# Patient Record
Sex: Female | Born: 1960 | Race: Black or African American | Hispanic: No | Smoking: Current some day smoker
Health system: Southern US, Community
[De-identification: ages and names within clinical notes are randomized; demographics above are authoritative.]

## PROBLEM LIST (undated history)

## (undated) ENCOUNTER — Emergency Department (HOSPITAL_COMMUNITY): Admission: EM | Payer: Medicaid Other | Source: Home / Self Care

## (undated) DIAGNOSIS — R519 Headache, unspecified: Secondary | ICD-10-CM

## (undated) DIAGNOSIS — R413 Other amnesia: Secondary | ICD-10-CM

## (undated) DIAGNOSIS — F32A Depression, unspecified: Secondary | ICD-10-CM

## (undated) DIAGNOSIS — E785 Hyperlipidemia, unspecified: Secondary | ICD-10-CM

## (undated) DIAGNOSIS — F419 Anxiety disorder, unspecified: Secondary | ICD-10-CM

## (undated) DIAGNOSIS — R51 Headache: Secondary | ICD-10-CM

## (undated) DIAGNOSIS — M549 Dorsalgia, unspecified: Secondary | ICD-10-CM

## (undated) DIAGNOSIS — M1990 Unspecified osteoarthritis, unspecified site: Secondary | ICD-10-CM

## (undated) DIAGNOSIS — D509 Iron deficiency anemia, unspecified: Secondary | ICD-10-CM

## (undated) DIAGNOSIS — F329 Major depressive disorder, single episode, unspecified: Secondary | ICD-10-CM

## (undated) DIAGNOSIS — K5730 Diverticulosis of large intestine without perforation or abscess without bleeding: Secondary | ICD-10-CM

## (undated) DIAGNOSIS — M5430 Sciatica, unspecified side: Secondary | ICD-10-CM

## (undated) HISTORY — DX: Other amnesia: R41.3

## (undated) HISTORY — DX: Diverticulosis of large intestine without perforation or abscess without bleeding: K57.30

## (undated) HISTORY — DX: Hyperlipidemia, unspecified: E78.5

## (undated) HISTORY — DX: Iron deficiency anemia, unspecified: D50.9

## (undated) HISTORY — DX: Sciatica, unspecified side: M54.30

## (undated) HISTORY — PX: OTHER SURGICAL HISTORY: SHX169

## (undated) HISTORY — PX: TONSILLECTOMY: SUR1361

---

## 1998-04-26 ENCOUNTER — Encounter: Admission: RE | Admit: 1998-04-26 | Discharge: 1998-04-26 | Payer: Self-pay | Admitting: Family Medicine

## 1998-06-01 ENCOUNTER — Encounter: Admission: RE | Admit: 1998-06-01 | Discharge: 1998-06-01 | Payer: Self-pay | Admitting: Family Medicine

## 1998-06-17 ENCOUNTER — Encounter: Admission: RE | Admit: 1998-06-17 | Discharge: 1998-09-15 | Payer: Self-pay | Admitting: *Deleted

## 1998-08-06 ENCOUNTER — Encounter: Admission: RE | Admit: 1998-08-06 | Discharge: 1998-08-06 | Payer: Self-pay | Admitting: Family Medicine

## 1998-09-01 ENCOUNTER — Encounter: Admission: RE | Admit: 1998-09-01 | Discharge: 1998-09-01 | Payer: Self-pay | Admitting: Family Medicine

## 1999-02-28 ENCOUNTER — Encounter (HOSPITAL_COMMUNITY): Admission: RE | Admit: 1999-02-28 | Discharge: 1999-02-28 | Payer: Self-pay | Admitting: Psychiatry

## 1999-03-08 ENCOUNTER — Emergency Department (HOSPITAL_COMMUNITY): Admission: EM | Admit: 1999-03-08 | Discharge: 1999-03-08 | Payer: Self-pay | Admitting: Emergency Medicine

## 1999-03-09 ENCOUNTER — Encounter: Payer: Self-pay | Admitting: Emergency Medicine

## 1999-04-29 ENCOUNTER — Encounter: Admission: RE | Admit: 1999-04-29 | Discharge: 1999-04-29 | Payer: Self-pay | Admitting: Family Medicine

## 1999-05-23 ENCOUNTER — Encounter: Admission: RE | Admit: 1999-05-23 | Discharge: 1999-05-23 | Payer: Self-pay | Admitting: Family Medicine

## 1999-10-13 ENCOUNTER — Encounter: Admission: RE | Admit: 1999-10-13 | Discharge: 1999-10-13 | Payer: Self-pay | Admitting: Family Medicine

## 1999-10-20 ENCOUNTER — Encounter: Admission: RE | Admit: 1999-10-20 | Discharge: 1999-10-20 | Payer: Self-pay | Admitting: Family Medicine

## 1999-10-27 ENCOUNTER — Encounter: Admission: RE | Admit: 1999-10-27 | Discharge: 1999-10-27 | Payer: Self-pay | Admitting: Family Medicine

## 2000-02-10 ENCOUNTER — Emergency Department (HOSPITAL_COMMUNITY): Admission: EM | Admit: 2000-02-10 | Discharge: 2000-02-10 | Payer: Self-pay | Admitting: Emergency Medicine

## 2000-06-29 ENCOUNTER — Emergency Department (HOSPITAL_COMMUNITY): Admission: EM | Admit: 2000-06-29 | Discharge: 2000-06-29 | Payer: Self-pay | Admitting: Emergency Medicine

## 2000-10-12 ENCOUNTER — Emergency Department (HOSPITAL_COMMUNITY): Admission: EM | Admit: 2000-10-12 | Discharge: 2000-10-12 | Payer: Self-pay | Admitting: Emergency Medicine

## 2000-11-19 ENCOUNTER — Encounter: Admission: RE | Admit: 2000-11-19 | Discharge: 2000-11-19 | Payer: Self-pay | Admitting: Family Medicine

## 2000-12-19 ENCOUNTER — Encounter: Admission: RE | Admit: 2000-12-19 | Discharge: 2000-12-19 | Payer: Self-pay | Admitting: Family Medicine

## 2001-01-01 ENCOUNTER — Encounter: Admission: RE | Admit: 2001-01-01 | Discharge: 2001-01-01 | Payer: Self-pay | Admitting: Family Medicine

## 2001-01-01 ENCOUNTER — Encounter: Payer: Self-pay | Admitting: Family Medicine

## 2001-01-08 ENCOUNTER — Encounter: Admission: RE | Admit: 2001-01-08 | Discharge: 2001-01-08 | Payer: Self-pay | Admitting: Family Medicine

## 2001-07-10 ENCOUNTER — Emergency Department (HOSPITAL_COMMUNITY): Admission: EM | Admit: 2001-07-10 | Discharge: 2001-07-11 | Payer: Self-pay | Admitting: *Deleted

## 2001-08-15 ENCOUNTER — Encounter: Admission: RE | Admit: 2001-08-15 | Discharge: 2001-08-15 | Payer: Self-pay | Admitting: Family Medicine

## 2002-01-01 ENCOUNTER — Encounter: Admission: RE | Admit: 2002-01-01 | Discharge: 2002-01-01 | Payer: Self-pay | Admitting: Family Medicine

## 2002-06-02 ENCOUNTER — Encounter: Admission: RE | Admit: 2002-06-02 | Discharge: 2002-06-02 | Payer: Self-pay | Admitting: Family Medicine

## 2002-06-02 ENCOUNTER — Other Ambulatory Visit: Admission: RE | Admit: 2002-06-02 | Discharge: 2002-06-02 | Payer: Self-pay | Admitting: Family Medicine

## 2002-09-24 ENCOUNTER — Encounter: Admission: RE | Admit: 2002-09-24 | Discharge: 2002-09-24 | Payer: Self-pay | Admitting: Family Medicine

## 2002-10-03 ENCOUNTER — Encounter: Payer: Self-pay | Admitting: Family Medicine

## 2002-10-03 ENCOUNTER — Encounter: Admission: RE | Admit: 2002-10-03 | Discharge: 2002-10-03 | Payer: Self-pay | Admitting: Family Medicine

## 2003-07-14 ENCOUNTER — Encounter: Admission: RE | Admit: 2003-07-14 | Discharge: 2003-07-14 | Payer: Self-pay | Admitting: Family Medicine

## 2003-11-23 ENCOUNTER — Emergency Department (HOSPITAL_COMMUNITY): Admission: EM | Admit: 2003-11-23 | Discharge: 2003-11-23 | Payer: Self-pay | Admitting: Emergency Medicine

## 2004-02-09 ENCOUNTER — Encounter (INDEPENDENT_AMBULATORY_CARE_PROVIDER_SITE_OTHER): Payer: Self-pay | Admitting: *Deleted

## 2004-02-09 LAB — CONVERTED CEMR LAB

## 2004-02-11 ENCOUNTER — Encounter: Admission: RE | Admit: 2004-02-11 | Discharge: 2004-02-11 | Payer: Self-pay | Admitting: Family Medicine

## 2004-02-11 ENCOUNTER — Encounter (INDEPENDENT_AMBULATORY_CARE_PROVIDER_SITE_OTHER): Payer: Self-pay | Admitting: Specialist

## 2004-11-23 ENCOUNTER — Ambulatory Visit: Payer: Self-pay | Admitting: Family Medicine

## 2004-11-29 ENCOUNTER — Ambulatory Visit (HOSPITAL_COMMUNITY): Admission: RE | Admit: 2004-11-29 | Discharge: 2004-11-29 | Payer: Self-pay | Admitting: Sports Medicine

## 2004-12-16 ENCOUNTER — Ambulatory Visit: Payer: Self-pay | Admitting: Family Medicine

## 2004-12-19 ENCOUNTER — Ambulatory Visit: Payer: Self-pay | Admitting: Family Medicine

## 2005-02-17 ENCOUNTER — Ambulatory Visit: Payer: Self-pay | Admitting: Family Medicine

## 2005-02-24 ENCOUNTER — Ambulatory Visit: Payer: Self-pay | Admitting: Family Medicine

## 2005-11-27 ENCOUNTER — Emergency Department (HOSPITAL_COMMUNITY): Admission: EM | Admit: 2005-11-27 | Discharge: 2005-11-28 | Payer: Self-pay | Admitting: Emergency Medicine

## 2006-02-15 ENCOUNTER — Ambulatory Visit: Payer: Self-pay | Admitting: Family Medicine

## 2006-02-22 ENCOUNTER — Ambulatory Visit: Payer: Self-pay | Admitting: Family Medicine

## 2006-02-28 ENCOUNTER — Ambulatory Visit: Payer: Self-pay | Admitting: Sports Medicine

## 2006-03-05 ENCOUNTER — Ambulatory Visit (HOSPITAL_COMMUNITY): Admission: RE | Admit: 2006-03-05 | Discharge: 2006-03-05 | Payer: Self-pay | Admitting: Family Medicine

## 2006-04-11 ENCOUNTER — Ambulatory Visit: Payer: Self-pay | Admitting: Family Medicine

## 2007-02-07 DIAGNOSIS — R159 Full incontinence of feces: Secondary | ICD-10-CM

## 2007-02-07 DIAGNOSIS — E7800 Pure hypercholesterolemia, unspecified: Secondary | ICD-10-CM

## 2007-02-07 DIAGNOSIS — F410 Panic disorder [episodic paroxysmal anxiety] without agoraphobia: Secondary | ICD-10-CM | POA: Insufficient documentation

## 2007-02-07 DIAGNOSIS — M545 Low back pain: Secondary | ICD-10-CM

## 2007-02-08 ENCOUNTER — Encounter (INDEPENDENT_AMBULATORY_CARE_PROVIDER_SITE_OTHER): Payer: Self-pay | Admitting: *Deleted

## 2007-08-01 ENCOUNTER — Encounter (INDEPENDENT_AMBULATORY_CARE_PROVIDER_SITE_OTHER): Payer: Self-pay | Admitting: *Deleted

## 2008-03-04 LAB — CONVERTED CEMR LAB: LDL Cholesterol: 140 mg/dL

## 2008-03-05 ENCOUNTER — Telehealth: Payer: Self-pay | Admitting: *Deleted

## 2008-03-06 ENCOUNTER — Encounter (INDEPENDENT_AMBULATORY_CARE_PROVIDER_SITE_OTHER): Payer: Self-pay | Admitting: *Deleted

## 2008-03-06 ENCOUNTER — Ambulatory Visit: Payer: Self-pay | Admitting: Family Medicine

## 2008-03-06 DIAGNOSIS — N920 Excessive and frequent menstruation with regular cycle: Secondary | ICD-10-CM

## 2008-03-06 LAB — CONVERTED CEMR LAB: Beta hcg, urine, semiquantitative: NEGATIVE

## 2008-03-10 LAB — CONVERTED CEMR LAB
ALT: 12 units/L (ref 0–35)
AST: 17 units/L (ref 0–37)
Albumin: 4.3 g/dL (ref 3.5–5.2)
Alkaline Phosphatase: 61 units/L (ref 39–117)
BUN: 12 mg/dL (ref 6–23)
CO2: 24 meq/L (ref 19–32)
Calcium: 9.1 mg/dL (ref 8.4–10.5)
Chlamydia, DNA Probe: NEGATIVE
Chloride: 106 meq/L (ref 96–112)
Creatinine, Ser: 0.73 mg/dL (ref 0.40–1.20)
Direct LDL: 140 mg/dL — ABNORMAL HIGH
GC Probe Amp, Genital: NEGATIVE
Glucose, Bld: 89 mg/dL (ref 70–99)
HCT: 35.8 % — ABNORMAL LOW (ref 36.0–46.0)
HCV Ab: NEGATIVE
Hemoglobin: 11.5 g/dL — ABNORMAL LOW (ref 12.0–15.0)
Hepatitis B Surface Ag: NEGATIVE
INR: 1 (ref 0.0–1.5)
MCHC: 32.1 g/dL (ref 30.0–36.0)
MCV: 88.6 fL (ref 78.0–100.0)
Pap Smear: NORMAL
Platelets: 384 10*3/uL (ref 150–400)
Potassium: 4.3 meq/L (ref 3.5–5.3)
Prothrombin Time: 13.3 s (ref 11.6–15.2)
RBC: 4.04 M/uL (ref 3.87–5.11)
RDW: 14.6 % (ref 11.5–15.5)
Sodium: 140 meq/L (ref 135–145)
TSH: 0.7 microintl units/mL (ref 0.350–5.50)
Total Bilirubin: 0.5 mg/dL (ref 0.3–1.2)
Total Protein: 7.3 g/dL (ref 6.0–8.3)
WBC: 6 10*3/uL (ref 4.0–10.5)
aPTT: 31 s

## 2008-03-16 ENCOUNTER — Ambulatory Visit: Payer: Self-pay | Admitting: Family Medicine

## 2008-03-16 ENCOUNTER — Encounter (INDEPENDENT_AMBULATORY_CARE_PROVIDER_SITE_OTHER): Payer: Self-pay | Admitting: *Deleted

## 2008-03-17 ENCOUNTER — Telehealth (INDEPENDENT_AMBULATORY_CARE_PROVIDER_SITE_OTHER): Payer: Self-pay | Admitting: Family Medicine

## 2008-03-18 ENCOUNTER — Encounter (INDEPENDENT_AMBULATORY_CARE_PROVIDER_SITE_OTHER): Payer: Self-pay | Admitting: *Deleted

## 2008-04-06 ENCOUNTER — Ambulatory Visit: Payer: Self-pay | Admitting: Family Medicine

## 2008-04-07 ENCOUNTER — Telehealth: Payer: Self-pay | Admitting: *Deleted

## 2008-04-17 ENCOUNTER — Ambulatory Visit (HOSPITAL_COMMUNITY): Admission: RE | Admit: 2008-04-17 | Discharge: 2008-04-17 | Payer: Self-pay | Admitting: *Deleted

## 2008-04-28 ENCOUNTER — Ambulatory Visit (HOSPITAL_COMMUNITY): Admission: RE | Admit: 2008-04-28 | Discharge: 2008-04-28 | Payer: Self-pay | Admitting: Family Medicine

## 2008-04-29 DIAGNOSIS — D259 Leiomyoma of uterus, unspecified: Secondary | ICD-10-CM | POA: Insufficient documentation

## 2008-04-30 ENCOUNTER — Telehealth (INDEPENDENT_AMBULATORY_CARE_PROVIDER_SITE_OTHER): Payer: Self-pay | Admitting: *Deleted

## 2008-07-03 ENCOUNTER — Telehealth: Payer: Self-pay | Admitting: *Deleted

## 2008-07-08 ENCOUNTER — Telehealth: Payer: Self-pay | Admitting: *Deleted

## 2008-10-28 ENCOUNTER — Telehealth: Payer: Self-pay | Admitting: *Deleted

## 2008-12-08 ENCOUNTER — Telehealth: Payer: Self-pay | Admitting: *Deleted

## 2008-12-14 ENCOUNTER — Telehealth: Payer: Self-pay | Admitting: Family Medicine

## 2009-02-01 ENCOUNTER — Ambulatory Visit: Payer: Self-pay | Admitting: Family Medicine

## 2009-02-01 ENCOUNTER — Encounter (INDEPENDENT_AMBULATORY_CARE_PROVIDER_SITE_OTHER): Payer: Self-pay | Admitting: Family Medicine

## 2009-02-01 DIAGNOSIS — B351 Tinea unguium: Secondary | ICD-10-CM | POA: Insufficient documentation

## 2009-02-02 ENCOUNTER — Encounter (INDEPENDENT_AMBULATORY_CARE_PROVIDER_SITE_OTHER): Payer: Self-pay | Admitting: Family Medicine

## 2009-02-02 LAB — CONVERTED CEMR LAB
ALT: 11 units/L (ref 0–35)
AST: 19 units/L (ref 0–37)
Albumin: 4.1 g/dL (ref 3.5–5.2)
Alkaline Phosphatase: 61 units/L (ref 39–117)
BUN: 10 mg/dL (ref 6–23)
CO2: 22 meq/L (ref 19–32)
Calcium: 9.4 mg/dL (ref 8.4–10.5)
Chloride: 107 meq/L (ref 96–112)
Cholesterol: 148 mg/dL (ref 0–200)
Creatinine, Ser: 0.77 mg/dL (ref 0.40–1.20)
Glucose, Bld: 109 mg/dL — ABNORMAL HIGH (ref 70–99)
HDL: 48 mg/dL (ref >39)
LDL Cholesterol: 80 mg/dL (ref 0–99)
Potassium: 4.2 meq/L (ref 3.5–5.3)
Sodium: 142 meq/L (ref 135–145)
Total Bilirubin: 0.2 mg/dL — ABNORMAL LOW (ref 0.3–1.2)
Total CHOL/HDL Ratio: 3.1
Total Protein: 7.1 g/dL (ref 6.0–8.3)
Triglycerides: 99 mg/dL (ref <150)
VLDL: 20 mg/dL (ref 0–40)

## 2009-06-18 ENCOUNTER — Telehealth: Payer: Self-pay | Admitting: *Deleted

## 2009-10-29 ENCOUNTER — Encounter: Payer: Self-pay | Admitting: Family Medicine

## 2009-10-29 ENCOUNTER — Ambulatory Visit: Payer: Self-pay | Admitting: Family Medicine

## 2009-10-29 LAB — CONVERTED CEMR LAB
HCT: 37.3 % (ref 36.0–46.0)
Hemoglobin: 11.5 g/dL — ABNORMAL LOW (ref 12.0–15.0)
MCHC: 30.8 g/dL (ref 30.0–36.0)
MCV: 86.7 fL (ref 78.0–100.0)
Platelets: 334 10*3/uL (ref 150–400)
RBC: 4.3 M/uL (ref 3.87–5.11)
RDW: 15.8 % — ABNORMAL HIGH (ref 11.5–15.5)
WBC: 6.6 10*3/uL (ref 4.0–10.5)

## 2009-11-01 ENCOUNTER — Encounter: Payer: Self-pay | Admitting: Family Medicine

## 2009-12-29 ENCOUNTER — Ambulatory Visit: Payer: Self-pay | Admitting: Family Medicine

## 2009-12-29 DIAGNOSIS — R358 Other polyuria: Secondary | ICD-10-CM

## 2009-12-29 LAB — CONVERTED CEMR LAB
Bilirubin Urine: NEGATIVE
Glucose, Urine, Semiquant: NEGATIVE
Ketones, urine, test strip: NEGATIVE
Nitrite: NEGATIVE
Protein, U semiquant: NEGATIVE
Specific Gravity, Urine: 1.02
Urobilinogen, UA: 0.2
WBC Urine, dipstick: NEGATIVE
pH: 7

## 2010-01-13 ENCOUNTER — Telehealth: Payer: Self-pay | Admitting: Family Medicine

## 2010-05-10 ENCOUNTER — Emergency Department (HOSPITAL_COMMUNITY): Admission: EM | Admit: 2010-05-10 | Discharge: 2010-05-10 | Payer: Self-pay | Admitting: Emergency Medicine

## 2010-08-16 ENCOUNTER — Emergency Department (HOSPITAL_COMMUNITY): Admission: EM | Admit: 2010-08-16 | Discharge: 2010-08-16 | Payer: Self-pay | Admitting: Emergency Medicine

## 2010-10-14 ENCOUNTER — Encounter: Payer: Self-pay | Admitting: Family Medicine

## 2010-12-16 ENCOUNTER — Telehealth: Payer: Self-pay | Admitting: *Deleted

## 2011-01-12 NOTE — Miscellaneous (Signed)
Summary: Clean up Problem List    

## 2011-01-12 NOTE — Progress Notes (Addendum)
Summary: Rx  Phone Note Refill Request Call back at Home Phone [phone]   Refills Requested: Medication #1:  PAROXETINE HCL 40 MG TABS Take 1/2 tablet by mouth once a day  Medication #2:  LOVASTATIN 40 MG  TABS Take one tablet at bedtime each day. pt just moved to [place], wants to know if we can send in a 3 month supply until she can find a family doctor there  Initial call taken by: Knox Royalty,  December 16, 2010 4:50 PM  Follow-up for Phone Call        where does she need the medication refills sent to?  Follow-up by: Bobby Rumpf  MD,  December 19, 2010 3:19 PM  Additional Follow-up for Phone Call Additional follow up Details #1::        left message for pt to either have the new pharmacy fax [place] the requests or call with new pharmacy info Additional Follow-up by: Golden Circle RN,  December 20, 2010 11:20 AM     Appended Document: Rx new pharmacy [phone] [place], [place] [address] [phone]  Appended Document: Rx done

## 2011-01-12 NOTE — Progress Notes (Signed)
Summary: Lab Res & Ref Ques  Phone Note Call from Patient Call back at [facility] Phone [phone]   Caller: Patient Summary of Call: Pt checking on lab work from last week.  Also Dr. Wallene Huh was going to check and see if Medicaid would cover her having a breast reduction. Initial call taken by: Clydell Hakim,  January 13, 2010 3:29 PM  Follow-up for Phone Call        will forward  to MD. Follow-up by: Theresia Lo RN,  January 13, 2010 4:42 PM  Additional Follow-up for Phone Call Additional follow up Details #1::        attempted to call patient. no answer. will try again  Additional Follow-up by: Bobby Rumpf  MD,  January 18, 2010 12:54 PM    Additional Follow-up for Phone Call Additional follow up Details #2::    pt returned call and needs to talk to him ASAP Follow-up by: De Nurse,  January 24, 2010 2:05 PM  Additional Follow-up for Phone Call Additional follow up Details #3:: Details for Additional Follow-up Action Taken: pt calling back again about results.  Additional Follow-up by: Clydell Hakim,  January 25, 2010 11:09 AM  I discussed the results of her UA & cbg. she had eaten when it was drawn. apologized that she had not rec'd a letter from [place] with the results. told her this is something that we usually do. perhaps it was lost in mail. she asked about breast reduction since her back hurts from them. states she is a 38C & she wants them lifted. told her medicaid does not pay for lifts. if excess breast tissue, they reduce the volume.  message to pcp that the process for getting a reduction approved is long & much documentation is needed. at her current size, it is doubtful that she qualifies. go to [facility] website & look for criteria for breast reduction. I have only seen them approve 1 in the almost 5 years I have been here. the process took about 1 yr.Marland KitchenMarland KitchenGolden Circle RN  January 25, 2010 4:26 PM

## 2011-01-12 NOTE — Assessment & Plan Note (Signed)
Summary: cpe,df   Vital Signs:  Patient profile:   50 year old female Height:      63.25 inches Weight:      143.6 pounds BMI:     25.33 Temp:     99.1 degrees F oral Pulse rate:   91 / minute BP sitting:   103 / 65  (left arm) Cuff size:   regular  Vitals Entered By: Garen Grams LPN (December 29, 2009 2:14 PM) CC: CPE Is Patient Diabetic? No Pain Assessment Patient in pain? no        Primary Care Provider:  Bobby Rumpf  MD  CC:  CPE.  History of Present Illness: 1) Onychomycosis: Unchanged. Had not started prescribed lamisil until two months ago, still continuing 12 week course.   2) Increased urination. Has noted increased urination x 1 month. Denies incontinence, increased thirst, medication change, head trauma, dysuria, fever, back pain. Urinating every hour, waking at night.   3) Breast reduction Would like breast reduction as she is experiencing back pain. Wants to know if medicaid would cover this. Last mammogram 1 year ago normal.  4) Prevention: Pap q3rs w/ normal Paps in '03, 06, '09, due in 2012. Received flu vaccine this season. Due for mammogram.   Habits & Providers  Alcohol-Tobacco-Diet     Tobacco Status: never  Allergies: No Known Drug Allergies  Physical Exam  General:  Well-developed,well-nourished,in no acute distress; alert,appropriate and cooperative throughout examination Eyes:  No corneal or conjunctival inflammation noted. EOMI. Perrla. Funduscopic exam benign, without hemorrhages, exudates or papilledema. Vision grossly normal. Ears:  External ear exam shows no significant lesions or deformities.  Otoscopic examination reveals clear canals, tympanic membranes are intact bilaterally without bulging, retraction, inflammation or discharge. Hearing is grossly normal bilaterally. Nose:  External nasal examination shows no deformity or inflammation. Nasal mucosa are pink and moist without lesions or exudates. Mouth:  Oral mucosa and oropharynx  without lesions or exudates.  Teeth in good repair. Neck:  No deformities, masses, or tenderness noted. Chest Wall:  No deformities, masses, or tenderness noted. Lungs:  Normal respiratory effort, chest expands symmetrically. Lungs are clear to auscultation, no crackles or wheezes. Heart:  Normal rate and regular rhythm. S1 and S2 normal without gallop, murmur, click, rub or other extra sounds. Abdomen:  Bowel sounds positive,abdomen soft and non-tender without masses, organomegaly or hernias noted. Msk:  No deformity or scoliosis noted of thoracic or lumbar spine.   Pulses:  R and L carotid,radial,femoral,dorsalis pedis and posterior tibial pulses are full and equal bilaterally Extremities:  toes of right foot with onychomycosis  Neurologic:  No cranial nerve deficits noted. Station and gait are normal. Plantar reflexes are down-going bilaterally. DTRs are symmetrical throughout. Sensory, motor and coordinative functions appear intact. Skin:  Intact without suspicious lesions or rashes Cervical Nodes:  No lymphadenopathy noted Psych:  Cognition and judgment appear intact. Alert and cooperative with normal attention span and concentration. No apparent delusions, illusions, hallucinations   Impression & Recommendations:  Problem # 1:  ONYCHOMYCOSIS, TOENAILS (ICD-110.1) Assessment Unchanged Continue Lamisil. Will follow.  Her updated medication list for this problem includes:    Terbinafine Hcl 250 Mg Tabs (Terbinafine hcl) .Marland Kitchen... 1 tablet by mouth daily x 12 weeks  Problem # 2:  POLYURIA (JYN-829.56) Assessment: New Will check UA, glucose concern for DM2. Will follow.  Orders: Urinalysis-[facility] (00000) Glucose Cap-[facility] (21308)  Problem # 3:  Preventive Health Care (ICD-V70.0) Assessment: Comment Only  Advised to call regarding obtaining  mamogram. Other prevention up to date.   Orders: [facility] - Est  40-64 yrs (16109)  Problem # 4:  HYPERCHOLESTEROLEMIA (ICD-272.0) Assessment:  Improved at goal. Continue statin.  Her updated medication list for this problem includes:    Lovastatin 40 Mg Tabs (Lovastatin) .Marland Kitchen... Take one tablet at bedtime each day.  Labs Reviewed: SGOT: 19 (02/01/2009)   SGPT: 11 (02/01/2009)   HDL:48 (02/01/2009)  LDL:80 (02/01/2009), 140 (60/45/4098)  Chol:148 (02/01/2009)  Trig:99 (02/01/2009)  Complete Medication List: 1)  Flexeril 5 Mg Tabs (Cyclobenzaprine hcl) .Marland Kitchen.. 1 tablet by mouth three times a day 2)  Paroxetine Hcl 40 Mg Tabs (Paroxetine hcl) .... Take 1/2 tablet by mouth once a day 3)  Caltrate 600+d 600-400 Mg-unit Tabs (Calcium carbonate-vitamin d) .... Take 1 tablet by mouth two times a day 4)  Ibuprofen 600 Mg Tabs (Ibuprofen) .Marland Kitchen.. 1 by mouth every 6 hours 5)  Aspirin 81 Mg Tbec (Aspirin) .... One by mouth every day 6)  Lovastatin 40 Mg Tabs (Lovastatin) .... Take one tablet at bedtime each day. 7)  Terbinafine Hcl 250 Mg Tabs (Terbinafine hcl) .Marland Kitchen.. 1 tablet by mouth daily x 12 weeks  Patient Instructions: 1)  It was great to see you today!  2)  Walk fast so that your sweat at least 5 times a week for 30 minutes.  3)  Schedule a mammogram.  4)  Follow up in 2 weeks if still having urinary trouble. I will call you with the results from today.   Laboratory Results   Urine Tests  Date/Time Received: December 29, 2009 2:56 PM  Date/Time Reported: December 29, 2009 3:16 PM   Routine Urinalysis   Color: yellow Appearance: Clear Glucose: negative   (Normal Range: Negative) Bilirubin: negative   (Normal Range: Negative) Ketone: negative   (Normal Range: Negative) Spec. Gravity: 1.020   (Normal Range: 1.003-1.035) Blood: trace-intact   (Normal Range: Negative) pH: 7.0   (Normal Range: 5.0-8.0) Protein: negative   (Normal Range: Negative) Urobilinogen: 0.2   (Normal Range: 0-1) Nitrite: negative   (Normal Range: Negative) Leukocyte Esterace: negative   (Normal Range: Negative)  Urine Microscopic RBC/HPF: 1-5 Bacteria/HPF:  1+ Epithelial/HPF: 1-5 Other: 2+ amorphous    Comments: ...............test performed by......Marland KitchenBonnie A. [place], MLS (ASCP)cm       Prevention & Chronic Care Immunizations   Influenza vaccine: Fluvax Non-MCR  (10/29/2009)    Tetanus booster: 05/11/1998: Done.   Tetanus booster due: 05/11/2008    Pneumococcal vaccine: Not documented  Other Screening   Pap smear: Normal  (03/10/2008)   Pap smear due: 03/11/2011    Mammogram: Normal  (04/20/2008)   Mammogram action/deferral: Ordered  (12/29/2009)   Mammogram due: 04/20/2009   Smoking status: never  (12/29/2009)  Lipids   Total Cholesterol: 148  (02/01/2009)   LDL: 80  (02/01/2009)   LDL Direct: 140  (03/06/2008)   HDL: 48  (02/01/2009)   Triglycerides: 99  (02/01/2009)    SGOT (AST): 19  (02/01/2009)   SGPT (ALT): 11  (02/01/2009)   Alkaline phosphatase: 61  (02/01/2009)   Total bilirubin: 0.2  (02/01/2009)    Lipid flowsheet reviewed?: Yes   Progress toward LDL goal: At goal  Self-Management Support :   Personal Goals (by the next clinic visit) :      Personal LDL goal: 100  (12/29/2009)    Lipid self-management support: Resources for patients handout  (12/29/2009)     Lipid self-management support not done because: Good outcomes  (12/29/2009)  Resource handout printed.

## 2011-02-26 LAB — GLUCOSE, CAPILLARY: Glucose-Capillary: 126 mg/dL — ABNORMAL HIGH (ref 70–99)

## 2011-05-02 ENCOUNTER — Other Ambulatory Visit: Payer: Self-pay | Admitting: Family Medicine

## 2011-05-02 MED ORDER — PAROXETINE HCL 40 MG PO TABS: 20.0000 mg | ORAL_TABLET | ORAL | Status: DC

## 2011-05-02 MED ORDER — LOVASTATIN 40 MG PO TABS: 40.0000 mg | ORAL_TABLET | Freq: Every day | ORAL | Status: DC

## 2011-05-02 NOTE — Telephone Encounter (Signed)
Pt states that she is here until Saturday.  States that she is planning on getting a MD in [place], but she hasn't quite figured her way around the city.  Pt says she has been on this medicine for 18 years and does not want to miss any doses.  Advised I would send a message to the MD and we would give her a call back with his decision.  Pt uses [facility] on [address] Brynna Dobos, [place]

## 2011-05-02 NOTE — Telephone Encounter (Signed)
Pt just moved to [place] but has not found a family doctor yet, is currently in [place] & wants to know if MD would be willing to give 2 month rx for her paxil and mevacor?

## 2011-05-02 NOTE — Telephone Encounter (Signed)
-----   Message from [facility] sent at 05/02/2011 2:25 PM -----  Please let her know she can pick up her medications at the pharmacy. (then can close encounter) Thanks! KC    Pt informed and very grateful [place], [place]

## 2011-05-03 ENCOUNTER — Other Ambulatory Visit: Payer: Self-pay | Admitting: Family Medicine

## 2011-05-03 MED ORDER — PAROXETINE HCL 40 MG PO TABS: 20.0000 mg | ORAL_TABLET | ORAL | Status: DC

## 2016-05-09 ENCOUNTER — Emergency Department (HOSPITAL_COMMUNITY)
Admission: EM | Admit: 2016-05-09 | Discharge: 2016-05-09 | Disposition: A | Discharge status: Home / Self Care | Payer: Medicaid Other | Attending: Emergency Medicine | Admitting: Emergency Medicine

## 2016-05-09 ENCOUNTER — Emergency Department (HOSPITAL_COMMUNITY): Payer: Medicaid Other

## 2016-05-09 ENCOUNTER — Encounter (HOSPITAL_COMMUNITY): Payer: Self-pay | Admitting: Emergency Medicine

## 2016-05-09 DIAGNOSIS — Z79899 Other long term (current) drug therapy: Secondary | ICD-10-CM | POA: Insufficient documentation

## 2016-05-09 DIAGNOSIS — Y9289 Other specified places as the place of occurrence of the external cause: Secondary | ICD-10-CM | POA: Insufficient documentation

## 2016-05-09 DIAGNOSIS — S3992XA Unspecified injury of lower back, initial encounter: Secondary | ICD-10-CM | POA: Diagnosis not present

## 2016-05-09 DIAGNOSIS — Z7982 Long term (current) use of aspirin: Secondary | ICD-10-CM | POA: Diagnosis not present

## 2016-05-09 DIAGNOSIS — W108XXA Fall (on) (from) other stairs and steps, initial encounter: Secondary | ICD-10-CM | POA: Insufficient documentation

## 2016-05-09 DIAGNOSIS — S92515A Nondisplaced fracture of proximal phalanx of left lesser toe(s), initial encounter for closed fracture: Secondary | ICD-10-CM | POA: Diagnosis not present

## 2016-05-09 DIAGNOSIS — G8929 Other chronic pain: Secondary | ICD-10-CM | POA: Insufficient documentation

## 2016-05-09 DIAGNOSIS — Y998 Other external cause status: Secondary | ICD-10-CM | POA: Diagnosis not present

## 2016-05-09 DIAGNOSIS — Y9389 Activity, other specified: Secondary | ICD-10-CM | POA: Diagnosis not present

## 2016-05-09 DIAGNOSIS — M5441 Lumbago with sciatica, right side: Secondary | ICD-10-CM

## 2016-05-09 DIAGNOSIS — S99922A Unspecified injury of left foot, initial encounter: Secondary | ICD-10-CM | POA: Diagnosis present

## 2016-05-09 DIAGNOSIS — S92912A Unspecified fracture of left toe(s), initial encounter for closed fracture: Secondary | ICD-10-CM

## 2016-05-09 HISTORY — DX: Dorsalgia, unspecified: M54.9

## 2016-05-09 MED ORDER — CYCLOBENZAPRINE HCL 10 MG PO TABS: 10.0000 mg | ORAL_TABLET | Freq: Two times a day (BID) | ORAL | Status: DC | PRN

## 2016-05-09 MED ORDER — IBUPROFEN 600 MG PO TABS: 600.0000 mg | ORAL_TABLET | Freq: Four times a day (QID) | ORAL | Status: DC | PRN

## 2016-05-09 NOTE — ED Notes (Signed)
Pt is in stable condition upon d/c and ambulates from ED. 

## 2016-05-09 NOTE — ED Notes (Signed)
Pt reports she fell down 5-6 wooden steps this afternoon. Denies hitting head. C/o LBP and left 5th toes pain. Pt ambulatory to ED.

## 2016-05-09 NOTE — ED Provider Notes (Signed)
CSN: [id number]     Arrival date & time 05/09/16  1408 History  By signing my name below, I, Eustaquio Maize, attest that this documentation has been prepared under the direction and in the presence of [employer], PA-C.  Electronically Signed: Eustaquio Maize, ED Scribe. 05/09/2016. 2:39 PM.   Chief Complaint  Patient presents with  . Fall  . Back Pain  . Toe Pain   The history is provided by the patient. No language interpreter was used.   HPI Comments: Danielle Villa is a 55 y.o. female who presents to the Emergency Department complaining of sudden onset, constant, right lower back pain intermittently radiating down into right foot s/p fall down 5-6 stairs earlier today as well as left 5th toe pain. She states she misstepped and fell backwards and slid down the stairs. The pain is exacerbated with movement. Pt is able to ambulate. Pt has had previous injury to lower back for which she had an MRI done. She states surgery was recommended by she has deferred surgical treatment and opted for medical management. Denies LOC, head injury, weakness, numbness, tingling, neck pain, or any other associated symptoms.   Past Medical History  Diagnosis Date  . Back pain    History reviewed. No pertinent past surgical history. No family history on file. Social History  Substance Use Topics  . Smoking status: Never Smoker   . Smokeless tobacco: None  . Alcohol Use: Yes     Comment: OCCATIONALLY   OB History    No data available     Review of Systems  Musculoskeletal: Positive for back pain and arthralgias. Negative for neck pain.  Neurological: Negative for syncope, weakness and numbness.   Allergies  Review of patient's allergies indicates no known allergies.  Home Medications   Prior to Admission medications   Medication Sig Start Date End Date Taking? Authorizing Provider  aspirin (ASPIRIN EC) 81 MG EC tablet Take 81 mg by mouth daily.      Historical Provider, MD  Calcium  Carbonate-Vitamin D (CALTRATE 600+D) 600-400 MG-UNIT per tablet Take 1 tablet by mouth 2 (two) times daily.      Historical Provider, MD  cyclobenzaprine (FLEXERIL) 5 MG tablet Take 5 mg by mouth 3 (three) times daily.      Historical Provider, MD  ibuprofen (ADVIL,MOTRIN) 600 MG tablet Take 600 mg by mouth every 6 (six) hours as needed.      Historical Provider, MD  lovastatin (MEVACOR) 40 MG tablet Take 1 tablet (40 mg total) by mouth at bedtime. 05/02/11   Mariana Arn, MD  PARoxetine (PAXIL) 40 MG tablet Take 0.5 tablets (20 mg total) by mouth every morning. 05/03/11   Mariana Arn, MD  terbinafine (LAMISIL) 250 MG tablet Take 250 mg by mouth daily. Take for 12 weeks     Historical Provider, MD   BP 120/77 mmHg  Pulse 90  Temp(Src) 98.6 F (37 C) (Oral)  Resp 16  Ht 5\' 2"  (1.575 m)  SpO2 97%   Physical Exam  Constitutional: She is oriented to person, place, and time. She appears well-developed and well-nourished. No distress.  HENT:  Head: Normocephalic and atraumatic.  Eyes: Conjunctivae are normal. Pupils are equal, round, and reactive to light. Right eye exhibits no discharge. Left eye exhibits no discharge. No scleral icterus.  Neck: Normal range of motion.  Pulmonary/Chest: Effort normal.  Musculoskeletal:  Inspection: No masses, deformity Palpation: No tenderness to palpation of C or T- spine or paraspinal muscles.  Tenderness to palpation of L spine in to sacrum with moderate tenderness over right paraspinal muscles.  ROM: Normal flexion, extension, lateral rotation and flexion of back although patient reports pain shooting down right leg with movement and stiffness with movement. Gait: Normal gait Reflexes: Patellar reflex is 2+ bilaterally, Achilles is 2+ bilaterally SLR: Postive seated straight leg raise on the right side. Negative on left.   Neurological: She is alert and oriented to person, place, and time.  Skin: Skin is warm and dry.  Psychiatric: She has a normal mood  and affect.    ED Course  Procedures (including critical care time)  DIAGNOSTIC STUDIES: Oxygen Saturation is 97% on RA, normal by my interpretation.    COORDINATION OF CARE: 2:35 PM-Discussed treatment plan which includes DG L Spine, DG L Foot, and pain medication with pt at bedside and pt agreed to plan.   Labs Review Labs Reviewed - No data to display  Imaging Review Dg Lumbar Spine Complete  05/09/2016  CLINICAL DATA:  Low back pain.  Fell down stairs today EXAM: LUMBAR SPINE - COMPLETE 4+ VIEW COMPARISON:  None. FINDINGS: There is no evidence of lumbar spine fracture. Alignment is normal. Intervertebral disc spaces are maintained. IMPRESSION: Negative. Electronically Signed   By: Franchot Gallo M.D.   On: 05/09/2016 15:27   Dg Foot Complete Left  05/09/2016  CLINICAL DATA:  Golden Circle down stairs today.  Foot pain EXAM: LEFT FOOT - COMPLETE 3+ VIEW COMPARISON:  None. FINDINGS: Nondisplaced fracture of the fifth proximal phalanx. This is in the midportion. No other fracture. Negative for arthropathy. IMPRESSION: Nondisplaced fracture fifth proximal phalanx. Electronically Signed   By: Franchot Gallo M.D.   On: 05/09/2016 15:28   I have personally reviewed and evaluated these images as part of my medical decision-making.   EKG Interpretation None      MDM   Final diagnoses:  Right-sided low back pain with right-sided sciatica  Toe fracture, left, closed, initial encounter   55 year old female with acute on chronic back pain and new toe pain. She is afebrile, not tachycardic or tachypneic, normotensive, and not hypoxic. No neurological deficits and normal neuro exam. Patient is ambulatory and pain is relatively mild. No loss of bowel or bladder control. No concern for cauda equina.  No fever, night sweats, weight loss, h/o cancer, IVDA, no recent procedure to back. Xray of back is negative for fracture. Xray of toe shows a non-displaced 5th proximal phalanx fracture. Buddy tape  applied, post-op shoe given, crutches given. Ibuprofen recommended for pain and ortho follow up. Supportive care and return precaution discussed. Appears safe for discharge at this time. Follow up as indicated in discharge paperwork.   I personally performed the services described in this documentation, which was scribed in my presence. The recorded information has been reviewed and is accurate.      Recardo Evangelist, PA-C 05/10/16 [place], MD 05/10/16 [phone]

## 2016-06-16 ENCOUNTER — Telehealth (HOSPITAL_COMMUNITY): Payer: Self-pay | Admitting: *Deleted

## 2016-06-16 NOTE — Telephone Encounter (Signed)
Telephoned patient at home # and left message to return call to [facility] 

## 2016-06-22 ENCOUNTER — Other Ambulatory Visit: Payer: Self-pay | Admitting: Obstetrics and Gynecology

## 2016-06-22 DIAGNOSIS — Z1231 Encounter for screening mammogram for malignant neoplasm of breast: Secondary | ICD-10-CM

## 2016-07-19 ENCOUNTER — Telehealth (HOSPITAL_COMMUNITY): Payer: Self-pay | Admitting: *Deleted

## 2016-07-19 NOTE — Telephone Encounter (Signed)
Telephoned patient at home # and confirmed [facility] appointment for Thursday August 10 1:00

## 2016-07-20 ENCOUNTER — Ambulatory Visit
Admission: RE | Admit: 2016-07-20 | Discharge: 2016-07-20 | Disposition: A | Discharge status: Home / Self Care | Payer: No Typology Code available for payment source | Source: Ambulatory Visit | Attending: Obstetrics and Gynecology | Admitting: Obstetrics and Gynecology

## 2016-07-20 ENCOUNTER — Ambulatory Visit (HOSPITAL_COMMUNITY)
Admission: RE | Admit: 2016-07-20 | Discharge: 2016-07-20 | Disposition: A | Discharge status: Home / Self Care | Payer: Self-pay | Source: Ambulatory Visit | Attending: Obstetrics and Gynecology | Admitting: Obstetrics and Gynecology

## 2016-07-20 ENCOUNTER — Encounter (HOSPITAL_COMMUNITY): Payer: Self-pay

## 2016-07-20 VITALS — BP 124/84 | Temp 98.9°F | Ht 62.0 in | Wt 154.4 lb

## 2016-07-20 DIAGNOSIS — Z1231 Encounter for screening mammogram for malignant neoplasm of breast: Secondary | ICD-10-CM

## 2016-07-20 DIAGNOSIS — Z01419 Encounter for gynecological examination (general) (routine) without abnormal findings: Secondary | ICD-10-CM

## 2016-07-20 NOTE — Progress Notes (Signed)
No complaints today.   Pap Smear: Pap smear completed today. Last Pap smear was 03/10/2008 and normal. Per patient has no history of an abnormal Pap smear. Last Pap smear result is in EPIC.  Physical exam: Breasts Breasts symmetrical. No skin abnormalities bilateral breasts. No nipple retraction bilateral breasts. No nipple discharge bilateral breasts. No lymphadenopathy. No lumps palpated bilateral breasts. No complaints of pain or tenderness on exam. Referred patient to the [facility] for a screening mammogram. Appointment scheduled for Thursday, July 20, 2016 at 1340.  Pelvic/Bimanual   Ext Genitalia No lesions, no swelling and no discharge observed on external genitalia.         Vagina Vagina pink and normal texture. No lesions or discharge observed in vagina.          Cervix Cervix is present. Cervix pink and of normal texture. No discharge observed.     Uterus Uterus is present and palpable. Uterus in normal position and normal size.        Adnexae Bilateral ovaries present and palpable. No tenderness on palpation.          Rectovaginal No rectal exam completed today since patient had no rectal complaints. No skin abnormalities observed on exam.    Smoking History: Patient has never smoked.  Patient Navigation: Patient education provided. Access to services provided for patient through [facility] program.   Colorectal Cancer Screening: Patient has never had a colonoscopy. No complaints today.

## 2016-07-20 NOTE — Patient Instructions (Signed)
Explained breast self awareness to Danielle Villa. Let her know [facility] will cover Pap smears and HPV typing every 5 years unless has a history of abnormal Pap smears. Referred patient to the [facility] for a screening mammogram. Appointment scheduled for Thursday, July 20, 2016 at 1340. Let patient know will follow up with her within the next couple weeks with results of Pap smear by phone. Information patient that [facility] will follow up with her within the next couple of weeks with results of mammogram by letter or phone. Danielle Villa verbalized understanding.  [name], [place], RN 1:50 PM

## 2016-07-20 NOTE — Addendum Note (Signed)
Encounter addended by: Armond Hang, LPN on: [id number]  [id number] PM<BR>    Actions taken: Order Entry activity accessed

## 2016-07-21 LAB — CYTOLOGY - PAP

## 2016-07-24 ENCOUNTER — Telehealth (HOSPITAL_COMMUNITY): Payer: Self-pay | Admitting: *Deleted

## 2016-07-24 ENCOUNTER — Encounter (HOSPITAL_COMMUNITY): Payer: Self-pay | Admitting: *Deleted

## 2016-07-24 NOTE — Telephone Encounter (Signed)
Telephoned patient at home # and discussed negative pap smear results. HPV was negative. Next pap smear due in five years. Patient voiced understanding.

## 2017-01-12 ENCOUNTER — Encounter: Payer: Self-pay | Admitting: Family Medicine

## 2017-01-12 ENCOUNTER — Ambulatory Visit (INDEPENDENT_AMBULATORY_CARE_PROVIDER_SITE_OTHER): Payer: Medicaid Other | Admitting: Family Medicine

## 2017-01-12 VITALS — BP 120/82 | HR 74 | Temp 98.0°F | Ht 62.0 in | Wt 149.0 lb

## 2017-01-12 DIAGNOSIS — Z7689 Persons encountering health services in other specified circumstances: Secondary | ICD-10-CM | POA: Diagnosis not present

## 2017-01-12 DIAGNOSIS — Z23 Encounter for immunization: Secondary | ICD-10-CM

## 2017-01-12 LAB — BASIC METABOLIC PANEL
BUN: 8 mg/dL (ref 7–25)
CO2: 29 mmol/L (ref 20–31)
Calcium: 9.6 mg/dL (ref 8.6–10.4)
Chloride: 103 mmol/L (ref 98–110)
Creat: 0.65 mg/dL (ref 0.50–1.05)
Glucose, Bld: 90 mg/dL (ref 65–99)
Potassium: 4.4 mmol/L (ref 3.5–5.3)
Sodium: 140 mmol/L (ref 135–146)

## 2017-01-12 LAB — POCT GLYCOSYLATED HEMOGLOBIN (HGB A1C): Hemoglobin A1C: 5.3

## 2017-01-12 LAB — CBC
HCT: 41.5 % (ref 35.0–45.0)
Hemoglobin: 13.7 g/dL (ref 11.7–15.5)
MCH: 29.4 pg (ref 27.0–33.0)
MCHC: 33 g/dL (ref 32.0–36.0)
MCV: 89.1 fL (ref 80.0–100.0)
MPV: 9.6 fL (ref 7.5–12.5)
Platelets: 335 10*3/uL (ref 140–400)
RBC: 4.66 MIL/uL (ref 3.80–5.10)
RDW: 14.6 % (ref 11.0–15.0)
WBC: 7.5 10*3/uL (ref 3.8–10.8)

## 2017-01-12 LAB — LIPID PANEL
Cholesterol: 209 mg/dL — ABNORMAL HIGH (ref <200)
HDL: 45 mg/dL — ABNORMAL LOW (ref >50)
LDL Cholesterol: 121 mg/dL — ABNORMAL HIGH (ref <100)
Total CHOL/HDL Ratio: 4.6 Ratio (ref <5.0)
Triglycerides: 213 mg/dL — ABNORMAL HIGH (ref <150)
VLDL: 43 mg/dL — ABNORMAL HIGH (ref <30)

## 2017-01-12 MED ORDER — METHOCARBAMOL 500 MG PO TABS: 500.0000 mg | ORAL_TABLET | Freq: Two times a day (BID) | ORAL | 2 refills | Status: DC

## 2017-01-12 MED ORDER — CETIRIZINE HCL 10 MG PO TABS: 10.0000 mg | ORAL_TABLET | Freq: Every day | ORAL | 11 refills | Status: DC

## 2017-01-12 MED ORDER — TRAZODONE HCL 50 MG PO TABS: 50.0000 mg | ORAL_TABLET | Freq: Every evening | ORAL | 2 refills | Status: DC | PRN

## 2017-01-12 MED ORDER — CALCIUM CARBONATE-VITAMIN D 600-400 MG-UNIT PO TABS: 1.0000 | ORAL_TABLET | Freq: Every day | ORAL | 3 refills | Status: DC

## 2017-01-12 MED ORDER — DICLOFENAC SODIUM 75 MG PO TBEC: 75.0000 mg | DELAYED_RELEASE_TABLET | Freq: Every morning | ORAL | 0 refills | Status: DC

## 2017-01-12 MED ORDER — ATORVASTATIN CALCIUM 10 MG PO TABS: 10.0000 mg | ORAL_TABLET | Freq: Every day | ORAL | 3 refills | Status: DC

## 2017-01-12 MED ORDER — PAROXETINE HCL 10 MG PO TABS: 10.0000 mg | ORAL_TABLET | ORAL | 3 refills | Status: DC

## 2017-01-12 NOTE — Patient Instructions (Addendum)
You were seen in clinic to establish care with your new doctor. It was a pleasure meeting you! As discussed, you can make an appointment for next week to address your other concerns with back and hip pain.  Your medications were refilled and sent to your pharmacy for pick up. You also had some blood work done and I will follow up with you with any abnormal results.  Thank you and see you next week!  Lovenia Kim, MD
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
## 2017-01-13 LAB — HIV ANTIBODY (ROUTINE TESTING W REFLEX): HIV 1&2 Ab, 4th Generation: NONREACTIVE

## 2017-01-13 LAB — HEPATITIS C ANTIBODY: HCV AB: NEGATIVE

## 2017-01-23 ENCOUNTER — Ambulatory Visit (INDEPENDENT_AMBULATORY_CARE_PROVIDER_SITE_OTHER): Payer: Medicaid Other | Admitting: Family Medicine

## 2017-01-23 ENCOUNTER — Other Ambulatory Visit (HOSPITAL_COMMUNITY)
Admission: RE | Admit: 2017-01-23 | Discharge: 2017-01-23 | Disposition: A | Discharge status: Home / Self Care | Payer: Medicaid Other | Source: Ambulatory Visit | Attending: Family Medicine | Admitting: Family Medicine

## 2017-01-23 ENCOUNTER — Encounter: Payer: Self-pay | Admitting: Family Medicine

## 2017-01-23 VITALS — BP 104/62 | HR 78 | Temp 97.3°F | Ht 62.0 in | Wt 148.4 lb

## 2017-01-23 DIAGNOSIS — Z113 Encounter for screening for infections with a predominantly sexual mode of transmission: Secondary | ICD-10-CM | POA: Insufficient documentation

## 2017-01-23 DIAGNOSIS — G8929 Other chronic pain: Secondary | ICD-10-CM | POA: Diagnosis not present

## 2017-01-23 DIAGNOSIS — M545 Low back pain: Secondary | ICD-10-CM

## 2017-01-23 DIAGNOSIS — L659 Nonscarring hair loss, unspecified: Secondary | ICD-10-CM

## 2017-01-23 DIAGNOSIS — Z0000 Encounter for general adult medical examination without abnormal findings: Secondary | ICD-10-CM | POA: Diagnosis not present

## 2017-01-23 DIAGNOSIS — E7800 Pure hypercholesterolemia, unspecified: Secondary | ICD-10-CM

## 2017-01-23 MED ORDER — ATORVASTATIN CALCIUM 20 MG PO TABS: 20.0000 mg | ORAL_TABLET | Freq: Every day | ORAL | 2 refills | Status: DC

## 2017-01-23 NOTE — Patient Instructions (Signed)
You were seen in clinic today for back pain and for your STD testing.  We reviewed your blood work from last visit and everything was normal except for cholesterol.  We will increase your Atorvastatin to 20 mg daily from today onwards.  Please pick this up from your pharmacy.  Also, for your back pain you can continue using heating pads.  I have provided a referral to a chiropractic center that accepts medicaid [facility]) for 8 sessions. I have also provided a dermatology referral for you .  They will call you to schedule an appointment.  Please follow up in clinic as needed.

## 2017-01-23 NOTE — Progress Notes (Signed)
Subjective:   Patient ID: Danielle Villa    DOB: [birth year], 56 y.o. female   MRN: [id number]  CC: back pain, needs derm referral, GC chlamydia testing   HPI: Danielle Villa is a 56 y.o. female who presents to clinic today for back pain.  Back pain present since 2012-2013, not localized to any particular side.  Reports her upper back, lower back and hips hurt. Currently taking Flexeril and Robaxim.  Has been taking both since 2012 and they don't seem to be helping her.   Describes the pain as sharp, does not radiate.  On a scale of 1-10 it is an 8 and constant in nature.  Has not tried antinflammatories, massage, physical therapy.  Has tried heating pads and they seem to help.   Patient would like to be tested for gonorrhea and chlamydia.  Is not currently experiencing any symptoms but would like to get checked as we did not have enough time to do so during her last visit.   Also, patient requesting a referral to dermatology as she had experienced hair loss at a salon.   ROS: Denies fevers, chills, nausea, vomiting, diarrhea.   Smoking status reviewed.  Patient is a never smoker.   Medications reviewed. Objective:   BP 104/62   Pulse 78   Temp 97.3 F (36.3 C) (Oral)   Ht 5\' 2"  (1.575 m)   Wt 148 lb 6.4 oz (67.3 kg)   SpO2 98%   BMI 27.14 kg/m  Vitals and nursing note reviewed.  General: well nourished, well developed, in no acute distress HEENT: NCAT, MMM  Neck: supple, non-tender, normal ROM  CV: RRR no MRG  Lungs: clear bilaterally with normal work of breathing Abdomen: soft, non-tender, no masses or organomegaly palpable,+bs MSK: ROM normal, no paraspinal tenderness noted, negative straight leg test Skin: warm, dry, no rashes or lesions Extremities: warm and well perfused, normal tone  Assessment & Plan:   HYPERCHOLESTEROLEMIA -Labs at last visit with cholesterol 209, TG 213, LDL 121.  -Patient taking Atorvastatin 10 mg daily.   -Will increase to 20 mg  daily and follow up   BACK PAIN, LOW Related to injury at workplace.  Patient states Flexeril and Robaxin not helping.  Has not tried physical therapy or massage as she states she cannot afford it.  Would benefit from seeing a chiropractor.  -Referral to [facility] provided.  Precepted with Dr. Wendy Poet and may be able to get patient in for a few sessions.  -Will continue to follow up   GC chlamydia tested today.  Will follow up with results.   Referral to Dermatology provided.   Orders Placed This Encounter  Procedures  . Ambulatory referral to Dermatology    Referral Priority:   Routine    Referral Type:   Consultation    Referral Reason:   Specialty Services Required    Requested Specialty:   Dermatology    Number of Visits Requested:   1  . Ambulatory referral to Chiropractic    Referral Priority:   Routine    Referral Type:   Chiropractic    Referral Reason:   Specialty Services Required    Requested Specialty:   Chiropractic Medicine    Number of Visits Requested:   1   Meds ordered this encounter  Medications  . atorvastatin (LIPITOR) 20 MG tablet    Sig: Take 1 tablet (20 mg total) by mouth daily.    Dispense:  30 tablet  Refill:  2   Follow up: 3 months   Lovenia Kim, MD [place], PGY-1 01/24/2017 11:39 PM

## 2017-01-24 NOTE — Assessment & Plan Note (Signed)
-  Labs at last visit with cholesterol 209, TG 213, LDL 121.  -Patient taking Atorvastatin 10 mg daily.   -Will increase to 20 mg daily and follow up

## 2017-01-24 NOTE — Assessment & Plan Note (Signed)
Related to injury at workplace.  Patient states Flexeril and Robaxin not helping.  Has not tried physical therapy or massage as she states she cannot afford it.  Would benefit from seeing a chiropractor.  -Referral to [facility] provided.  Precepted with Dr. Wendy Poet and may be able to get patient in for a few sessions.  -Will continue to follow up

## 2017-01-26 LAB — GC/CHLAMYDIA PROBE AMP (~~LOC~~) NOT AT ARMC
Chlamydia: NEGATIVE
Neisseria Gonorrhea: NEGATIVE

## 2017-01-29 ENCOUNTER — Encounter: Payer: Self-pay | Admitting: Family Medicine

## 2017-01-29 ENCOUNTER — Telehealth: Payer: Self-pay | Admitting: Family Medicine

## 2017-01-29 NOTE — Telephone Encounter (Signed)
Please call patient and let her know that her GC test results were negative. Thanks!

## 2017-01-31 NOTE — Telephone Encounter (Signed)
Patient informed, expressed understanding. 

## 2017-02-08 ENCOUNTER — Telehealth: Payer: Self-pay | Admitting: Family Medicine

## 2017-02-08 NOTE — Telephone Encounter (Signed)
Pt needs a referral for an eye exam, pt has medicaid. ep

## 2017-03-05 ENCOUNTER — Other Ambulatory Visit: Payer: Self-pay | Admitting: Family Medicine

## 2017-03-08 NOTE — Telephone Encounter (Signed)
Called patient to let her know that she would need to come in and be evaluated before getting a referral.  She agrees and has an appointment scheduled Monday.

## 2017-03-12 ENCOUNTER — Ambulatory Visit (INDEPENDENT_AMBULATORY_CARE_PROVIDER_SITE_OTHER): Payer: Medicaid Other | Admitting: Family Medicine

## 2017-03-12 ENCOUNTER — Encounter: Payer: Self-pay | Admitting: Family Medicine

## 2017-03-12 VITALS — BP 132/80 | HR 77 | Temp 98.4°F | Wt 149.0 lb

## 2017-03-12 DIAGNOSIS — M545 Low back pain: Secondary | ICD-10-CM | POA: Diagnosis not present

## 2017-03-12 DIAGNOSIS — H538 Other visual disturbances: Secondary | ICD-10-CM

## 2017-03-12 DIAGNOSIS — L659 Nonscarring hair loss, unspecified: Secondary | ICD-10-CM

## 2017-03-12 DIAGNOSIS — G8929 Other chronic pain: Secondary | ICD-10-CM | POA: Diagnosis not present

## 2017-03-12 MED ORDER — CETIRIZINE HCL 10 MG PO TABS: 10.0000 mg | ORAL_TABLET | Freq: Every day | ORAL | 11 refills | Status: DC

## 2017-03-12 MED ORDER — CALCIUM CARBONATE-VITAMIN D 600-400 MG-UNIT PO TABS: 1.0000 | ORAL_TABLET | Freq: Every day | ORAL | 3 refills | Status: DC

## 2017-03-12 NOTE — Progress Notes (Signed)
Subjective:   Patient ID: Danielle Villa    DOB: 1961/12/08, 56 y.o. female   MRN: [id number]  CC: hip/back pain, colonoscopy referral, wants eye referral   HPI: Danielle Villa is a 56 y.o. female who presents to clinic today for follow up on several issues.    Lower back pain  Was referred to [facility] chiropractor at last visit.  Has been going for sessions and reports this has been helping but still has the lower back pain.  Takes Tramadol, Paxil, Flexeril and Robaxin.  Recommend taking daily aspirin.  Has 2 more sessions left with Chiropractor (this week and next week).  Has not tried physical therapy but she is now interested in it as long as it is covered by her insurance. Has been given back exercises to do at home and does them sometimes.    Vision blurry Reports she has not had her eyes checked in almost 2.5 years. She is concerned because her eyesight has been worsening over the last year.  Wears glasses and contacts. Hasn't had insurance so buys eyeglasses from dollar store but may not be using the right prescription.  Describes her eyesight as blurry and sometimes has to wear 2 pairs of glasses on top of each other in order to see well.  No eye pain.  Eyes feel dry and itchy.  Has used Visine and contact drops.  Reports mild redness from her itching her eyes.  Taking Zyrtec for allergies currently.     Health Maintenance Interested in addressing  colonoscopy today.  Explained procedure, prep, and importance of screening.  Patient will make a call to schedule.  Will follow up with result.   ROS: Denies fever, chills, shortness of breath, nausea, vomiting, diarrhea.   [place]: Pertinent past medical, surgical, family, and social history were reviewed and updated as appropriate. Smoking status reviewed.  Patient is a nonsmoker.   Medications reviewed. Objective:   BP 132/80   Pulse 77   Temp 98.4 F (36.9 C) (Oral)   Wt 149 lb (67.6 kg)   BMI 27.25 kg/m  Vitals and  nursing note reviewed.  General: well nourished, well developed, in NAD  HEENT: NCAT, PERRL, EOMI, MMM, o/p clear  Neck: supple, non-tender without LAD  CV: RRR no MRG  Lungs: CTA B/L, comfortable work of breathing  Abdomen: soft, non-tender, +bs Skin: warm, dry, no rashes or lesions  Extremities: warm and well perfused, normal tone Psych: mood and affect normal   Assessment & Plan:   Vision blurry -Patient notes worsening vision.  Has not seen an eye doctor in over 2.5 years and would like to see one soon regarding symptoms. No erythema or swelling present.   -Referral placed  BACK PAIN, LOW -Stable, likely 2/2 injury from old workplace.  Patient completed 6 sessions, recommend continuing sessions with Chiropractor -Consider PT once chiropractor sessions complete.  Patient initially reluctant to start PT as it may not be covered by her insurance but agreeable to try if possible  -Continue home lower back exercises, heating pads and stretching  -Will continue to follow.   Dermatology/Hair issue -Referred to Derm at last visit however unfortunately patient did not hear back.  She would like to see a dermatologist regarding some hair loss after she had visited a salon.   -Referral placed   HM: -Colonoscopy addressed today.  Provided with information and patient to make appointment.    Orders Placed This Encounter  Procedures  . Ambulatory referral to  Optometry    Referral Priority:   Routine    Referral Type:   Vision Transport planner)    Referral Reason:   Specialty Services Required    Requested Specialty:   Optometry    Number of Visits Requested:   1  . Ambulatory referral to Dermatology    Referral Priority:   Routine    Referral Type:   Consultation    Referral Reason:   Specialty Services Required    Requested Specialty:   Dermatology    Number of Visits Requested:   1   Meds ordered this encounter  Medications  . cetirizine (ZYRTEC) 10 MG tablet    Sig: Take 1 tablet  (10 mg total) by mouth daily.    Dispense:  30 tablet    Refill:  11  . Calcium Carbonate-Vitamin D (CALTRATE 600+D) 600-400 MG-UNIT tablet    Sig: Take 1 tablet by mouth daily.    Dispense:  30 tablet    Refill:  3   Follow up: 4-6 weeks   Lovenia Kim, MD [place], PGY-1 03/13/2017 4:09 PM

## 2017-03-12 NOTE — Patient Instructions (Signed)
It was nice to see you again! I referred you to a optometrist today for your concerns with worsening vision and you can expect a call this week for an appointment to be scheduled.  Additionally, I placed another referral for you to Dermatology as you did not hear back unfortunately.  I would like you to call and make an appointment to have a colonoscopy done and will follow up with you once I have your results.   Be well,  Lovenia Kim, MD

## 2017-03-13 NOTE — Assessment & Plan Note (Signed)
-  Stable, likely 2/2 injury from old workplace.  Patient completed 6 sessions, recommend continuing sessions with Chiropractor -Consider PT once chiropractor sessions complete.  Patient initially reluctant to start PT as it may not be covered by her insurance but agreeable to try if possible  -Continue home lower back exercises, heating pads and stretching  -Will continue to follow.

## 2017-03-28 DIAGNOSIS — Z1211 Encounter for screening for malignant neoplasm of colon: Secondary | ICD-10-CM | POA: Diagnosis not present

## 2017-03-28 DIAGNOSIS — Z800 Family history of malignant neoplasm of digestive organs: Secondary | ICD-10-CM | POA: Diagnosis not present

## 2017-04-11 ENCOUNTER — Other Ambulatory Visit: Payer: Self-pay | Admitting: Family Medicine

## 2017-04-18 DIAGNOSIS — L249 Irritant contact dermatitis, unspecified cause: Secondary | ICD-10-CM | POA: Diagnosis not present

## 2017-04-18 DIAGNOSIS — T07XXXA Unspecified multiple injuries, initial encounter: Secondary | ICD-10-CM | POA: Diagnosis not present

## 2017-05-17 ENCOUNTER — Ambulatory Visit (INDEPENDENT_AMBULATORY_CARE_PROVIDER_SITE_OTHER): Payer: Medicaid Other | Admitting: Family Medicine

## 2017-05-17 ENCOUNTER — Encounter: Payer: Self-pay | Admitting: Family Medicine

## 2017-05-17 VITALS — BP 106/70 | HR 69 | Temp 98.4°F | Wt 150.0 lb

## 2017-05-17 DIAGNOSIS — K5900 Constipation, unspecified: Secondary | ICD-10-CM | POA: Diagnosis not present

## 2017-05-17 DIAGNOSIS — R358 Other polyuria: Secondary | ICD-10-CM | POA: Diagnosis not present

## 2017-05-17 DIAGNOSIS — R3589 Other polyuria: Secondary | ICD-10-CM

## 2017-05-17 LAB — POCT URINALYSIS DIP (MANUAL ENTRY)
Glucose, UA: NEGATIVE mg/dL
Nitrite, UA: NEGATIVE
PH UA: 5.5 (ref 5.0–8.0)
Protein Ur, POC: NEGATIVE mg/dL
Urobilinogen, UA: 0.2 E.U./dL

## 2017-05-17 MED ORDER — POLYETHYLENE GLYCOL 3350 17 GM/SCOOP PO POWD: 17.0000 g | Freq: Every day | ORAL | 1 refills | Status: DC

## 2017-05-17 NOTE — Assessment & Plan Note (Signed)
See above

## 2017-05-17 NOTE — Addendum Note (Signed)
Addended by: Schuyler Amor on: 05/17/2017 04:36 PM   Modules accepted: Orders

## 2017-05-17 NOTE — Progress Notes (Signed)
   Subjective:   Danielle Villa is a 56 y.o. female with a history of Fibroid uterus, HLD, panic attacks here for  Chief Complaint  Patient presents with  . Polyuria     Frequent urination - q45 min at night - q1.5h during the day - going about 18 times in 24 hours - has noticed urine was dark for ~2 days - yesterday had abd pain related to constipation - better after BM - no urge incontinence, dysuria, fevers - sometimes with stress incontinence after laughing or sneezing - going on for ~1 yr - thinks she urinates a large volume when she does - drinks 3-8 oz bottles of water daily - drinks 1 cup of coffee daily - states she has firm stools ~4 times weekly  Review of Systems:  Per HPI.   Social History: Never smoker  Objective:  BP 106/70   Pulse 69   Temp 98.4 F (36.9 C) (Oral)   Wt 150 lb (68 kg)   BMI 27.44 kg/m   Gen:  56 y.o. female in NAD HEENT: NCAT, MMM, anicteric sclerae CV: RRR, no MRG Resp: Non-labored, CTAB, no wheezes noted Abd: Soft, NTND, BS present, no guarding or organomegaly Ext: WWP, no edema MSK: No obvious deformities, gait intact Neuro: Alert and oriented, speech normal       Chemistry      Component Value Date/Time   NA 140 01/12/2017 1149   K 4.4 01/12/2017 1149   CL 103 01/12/2017 1149   CO2 29 01/12/2017 1149   BUN 8 01/12/2017 1149   CREATININE 0.65 01/12/2017 1149      Component Value Date/Time   CALCIUM 9.6 01/12/2017 1149   ALKPHOS 61 02/01/2009 2040   AST 19 02/01/2009 2040   ALT 11 02/01/2009 2040   BILITOT 0.2 (L) 02/01/2009 2040      Lab Results  Component Value Date   WBC 7.5 01/12/2017   HGB 13.7 01/12/2017   HCT 41.5 01/12/2017   MCV 89.1 01/12/2017   PLT 335 01/12/2017   Lab Results  Component Value Date   TSH 0.700 03/06/2008   Lab Results  Component Value Date   HGBA1C 5.3 01/12/2017   Assessment & Plan:     Danielle Villa is a 56 y.o. female here for   POLYURIA Problem is  long-standing Patient states has been going on for about a year, but has also been on her problem list since 2011 Recent screening for diabetes was negative with an A1c of 5.3 Doubt infection given long duration And lack of dysuria, fever Could be related to compression of bladder - does have history of fibroid uterus, but there was only a single 2 cm fibroid noted in 2009 Most likely due to constipation Check UA Start daily miralax and titrate to one soft bowel movement daily F/u in 1 month   Constipation See above   Virginia Crews, MD MPH PGY-3,  [facility] 05/17/2017  4:26 PM

## 2017-05-17 NOTE — Assessment & Plan Note (Addendum)
Problem is long-standing Patient states has been going on for about a year, but has also been on her problem list since 2011 Recent screening for diabetes was negative with an A1c of 5.3 Doubt infection given long duration And lack of dysuria, fever Could be related to compression of bladder - does have history of fibroid uterus, but there was only a single 2 cm fibroid noted in 2009 Most likely due to constipation Check UA Start daily miralax and titrate to one soft bowel movement daily F/u in 1 month

## 2017-05-17 NOTE — Patient Instructions (Signed)

## 2017-05-19 LAB — URINE CULTURE

## 2017-05-22 NOTE — Progress Notes (Signed)
LM on VM to CB ( urine sent for culture).

## 2017-06-12 ENCOUNTER — Other Ambulatory Visit: Payer: Self-pay | Admitting: Family Medicine

## 2017-06-29 ENCOUNTER — Encounter: Payer: Self-pay | Admitting: Family Medicine

## 2017-06-29 ENCOUNTER — Ambulatory Visit (INDEPENDENT_AMBULATORY_CARE_PROVIDER_SITE_OTHER): Payer: Medicaid Other | Admitting: Family Medicine

## 2017-06-29 VITALS — BP 112/78 | HR 85 | Temp 98.9°F | Ht 62.0 in | Wt 149.0 lb

## 2017-06-29 DIAGNOSIS — M545 Low back pain, unspecified: Secondary | ICD-10-CM

## 2017-06-29 DIAGNOSIS — G8929 Other chronic pain: Secondary | ICD-10-CM | POA: Diagnosis not present

## 2017-06-29 DIAGNOSIS — M25562 Pain in left knee: Secondary | ICD-10-CM | POA: Diagnosis not present

## 2017-06-29 MED ORDER — CYCLOBENZAPRINE HCL 10 MG PO TABS: 10.0000 mg | ORAL_TABLET | Freq: Two times a day (BID) | ORAL | 0 refills | Status: DC | PRN

## 2017-06-29 MED ORDER — TRAZODONE HCL 50 MG PO TABS: 50.0000 mg | ORAL_TABLET | Freq: Every evening | ORAL | 2 refills | Status: DC | PRN

## 2017-06-29 MED ORDER — ASPIRIN 81 MG PO TBEC: 81.0000 mg | DELAYED_RELEASE_TABLET | Freq: Every day | ORAL | 3 refills | Status: DC

## 2017-06-29 MED ORDER — CALCIUM CARBONATE-VITAMIN D 600-400 MG-UNIT PO TABS: 1.0000 | ORAL_TABLET | Freq: Every day | ORAL | 3 refills | Status: DC

## 2017-06-29 MED ORDER — PAROXETINE HCL 10 MG PO TABS: 10.0000 mg | ORAL_TABLET | ORAL | 3 refills | Status: DC

## 2017-06-29 MED ORDER — METHOCARBAMOL 500 MG PO TABS
500.0000 mg | ORAL_TABLET | Freq: Two times a day (BID) | ORAL | 2 refills | Status: DC
Start: 2017-06-29 — End: 2017-09-05

## 2017-06-29 MED ORDER — DICLOFENAC SODIUM 75 MG PO TBEC: 75.0000 mg | DELAYED_RELEASE_TABLET | Freq: Every morning | ORAL | 0 refills | Status: DC

## 2017-06-29 NOTE — Patient Instructions (Addendum)
It was nice seeing you again! You were seen in clinic for follow up for your back pain and left knee pain.  I suspect your knee pain is likely related to early osteoarthritis and would recommend using a knee brace for support.  Additionally, you can take Ibuprofen as needed for the pain and try to apply heating pads or ice for pain relief.  I have provided you with a referral to a Chiropractor as this has helped with your back pain in the past.  You can expect to receive a call next week regarding scheduling this.   I have refilled your medications and sent them to your pharmacy where they will be available for pick up.  Call clinic with questions you may have.   Be well,  Lovenia Kim, MD     Knee Pain, Adult Many things can cause knee pain. The pain often goes away on its own with time and rest. If the pain does not go away, tests may be done to find out what is causing the pain. Follow these instructions at home: Activity  Rest your knee.  Do not do things that cause pain.  Avoid activities where both feet leave the ground at the same time (high-impact activities). Examples are running, jumping rope, and doing jumping jacks. General instructions  Take medicines only as told by your doctor.  Raise (elevate) your knee when you are resting. Make sure your knee is higher than your heart.  Sleep with a pillow under your knee.  If told, put ice on the knee: ? Put ice in a plastic bag. ? Place a towel between your skin and the bag. ? Leave the ice on for 20 minutes, 2-3 times a day.  Ask your doctor if you should wear an elastic knee support.  Lose weight if you are overweight. Being overweight can make your knee hurt more.  Do not use any tobacco products. These include cigarettes, chewing tobacco, or electronic cigarettes. If you need help quitting, ask your doctor. Smoking may slow down healing. Contact a doctor if:  The pain does not stop.  The pain changes or gets  worse.  You have a fever along with knee pain.  Your knee gives out or locks up.  Your knee swells, and becomes worse. Get help right away if:  Your knee feels warm.  You cannot move your knee.  You have very bad knee pain.  You have chest pain.  You have trouble breathing. Summary  Many things can cause knee pain. The pain often goes away on its own with time and rest.  Avoid activities that put stress on your knee. These include running and jumping rope.  Get help right away if you cannot move your knee, or if your knee feels warm, or if you have trouble breathing. This information is not intended to replace advice given to you by your health care provider. Make sure you discuss any questions you have with your health care provider. Document Released: 02/23/2009 Document Revised: 11/21/2016 Document Reviewed: 11/21/2016 Elsevier Interactive Patient Education  2017 [employer].

## 2017-06-29 NOTE — Progress Notes (Signed)
Subjective:   Patient ID: HARBOR PASTER    DOB: May 01, 1961, 56 y.o. female   MRN: [id number]  CC: back pain, knee pain  HPI: Danielle Villa is a 56 y.o. female who presents to clinic today for follow up on knee and back pain.   Knee pain Following up for chronic left knee pain when she walks.  Has been going on for about 2 years.  Denies falls, or trauma to the knee.  At home does ROM exercises and takes Robaxin and Flexeril.   States that the Flexeril she has been taking for her back pain also helps with her knee pain.  Takes Ibuprofen as needed as well.  Does not ice the knee or use heating pads.  Has not tried using knee support brace.  Does not feel like her knee gives out.  Denies locking, catching or clicking sensation.    Back pain  Chronic back pain which has been present for years with no acute worsening or changes.  Was provided with referral to Chiropractor which has helped.  She went for about 8 weeks.  Is taking Flexeril and Robaxin at home.  Would like referral to go back to Chiropractor if Medicaid would cover.    ROS: See HPI for pertinent ROS.   [place]: Pertinent past medical, surgical, family, and social history were reviewed and updated as appropriate. Smoking status reviewed.  Medications reviewed. Objective:   BP 112/78   Pulse 85   Temp 98.9 F (37.2 C) (Oral)   Ht 5\' 2"  (1.575 m)   Wt 149 lb (67.6 kg)   SpO2 99%   BMI 27.25 kg/m  Vitals and nursing note reviewed.  General: 56 yo female, appears comfortable, in no acute distress  HEENT: normal  Neck: supple, normal ROM  CV: RRR no MRG  Lungs: CTAB  Abdomen: soft, NTND, +BS  Musculoskeletal: Left knee- slight joint line tenderness over medial aspect, normal ROM  Neuro: strength 5/5 bilaterally in upper and lower ext, normal sensation, negative straight leg test   Skin: warm, dry   Extremities: warm and well perfused, normal tone    Assessment & Plan:   BACK PAIN, LOW Stable.  No red flags  on exam.  Recommend ROM and stretching exercises at home. Using Flexeril and Robaxin as needed.  Previous 8 sessions of Chiropractor has helped and she would like another referral.   -Referred to Chiropractor   Left knee pain Continue ROM exercises. No red flags on exam.   Recommend Ibuprofen as needed, use of ice and/or heating pad for comfort.   -Advised her to wear knee support brace  Orders Placed This Encounter  Procedures  . Ambulatory referral to Chiropractic    Referral Priority:   Routine    Referral Type:   Chiropractic    Referral Reason:   Specialty Services Required    Requested Specialty:   Chiropractic Medicine    Number of Visits Requested:   1   Meds ordered this encounter  Medications  . aspirin (ASPIRIN EC) 81 MG EC tablet    Sig: Take 1 tablet (81 mg total) by mouth daily.    Dispense:  30 tablet    Refill:  3  . Calcium Carbonate-Vitamin D (CALTRATE 600+D) 600-400 MG-UNIT tablet    Sig: Take 1 tablet by mouth daily.    Dispense:  30 tablet    Refill:  3  . cyclobenzaprine (FLEXERIL) 10 MG tablet    Sig: Take 1  tablet (10 mg total) by mouth 2 (two) times daily as needed for muscle spasms.    Dispense:  20 tablet    Refill:  0  . diclofenac (VOLTAREN) 75 MG EC tablet    Sig: Take 1 tablet (75 mg total) by mouth every morning.    Dispense:  90 tablet    Refill:  0    Please consider 90 day supplies to promote better adherence  . methocarbamol (ROBAXIN) 500 MG tablet    Sig: Take 1 tablet (500 mg total) by mouth 2 (two) times daily.    Dispense:  60 tablet    Refill:  2  . PARoxetine (PAXIL) 10 MG tablet    Sig: Take 1 tablet (10 mg total) by mouth every morning.    Dispense:  30 tablet    Refill:  3  . traZODone (DESYREL) 50 MG tablet    Sig: Take 1 tablet (50 mg total) by mouth at bedtime as needed for sleep.    Dispense:  30 tablet    Refill:  2   Follow up: 3 mo   Lovenia Kim, MD [place], PGY-1 07/04/2017 9:28 AM

## 2017-07-04 DIAGNOSIS — M25562 Pain in left knee: Secondary | ICD-10-CM | POA: Insufficient documentation

## 2017-07-04 NOTE — Assessment & Plan Note (Signed)
Stable.  No red flags on exam.  Recommend ROM and stretching exercises at home. Using Flexeril and Robaxin as needed.  Previous 8 sessions of Chiropractor has helped and she would like another referral.   -Referred to Chiropractor

## 2017-07-04 NOTE — Assessment & Plan Note (Signed)
Continue ROM exercises. No red flags on exam.   Recommend Ibuprofen as needed, use of ice and/or heating pad for comfort.   -Advised her to wear knee support brace

## 2017-07-11 ENCOUNTER — Telehealth: Payer: Self-pay | Admitting: *Deleted

## 2017-07-11 NOTE — Telephone Encounter (Signed)
Prior Authorization received from [employer] for Diclofenac 75 mg. Formulary and PA form placed in provider box for completion. Derl Barrow, RN

## 2017-07-20 NOTE — Telephone Encounter (Signed)
PA approved for diclofenac 75 mg via [employer] [employer] until 11/17/2017.  Approval number: [id number]. Derl Barrow, RN

## 2017-07-23 NOTE — Telephone Encounter (Signed)
Thanks Tamika. 

## 2017-08-24 ENCOUNTER — Ambulatory Visit: Payer: Medicaid Other | Admitting: Family Medicine

## 2017-09-05 ENCOUNTER — Encounter: Payer: Self-pay | Admitting: Licensed Clinical Social Worker

## 2017-09-05 ENCOUNTER — Ambulatory Visit (INDEPENDENT_AMBULATORY_CARE_PROVIDER_SITE_OTHER): Payer: Medicaid Other | Admitting: Family Medicine

## 2017-09-05 DIAGNOSIS — F39 Unspecified mood [affective] disorder: Secondary | ICD-10-CM

## 2017-09-05 DIAGNOSIS — R4586 Emotional lability: Secondary | ICD-10-CM

## 2017-09-05 MED ORDER — LORATADINE 10 MG PO TABS: 10.0000 mg | ORAL_TABLET | Freq: Every day | ORAL | 11 refills | Status: DC

## 2017-09-05 MED ORDER — TRAMADOL HCL 50 MG PO TABS: 50.0000 mg | ORAL_TABLET | Freq: Four times a day (QID) | ORAL | 3 refills | Status: DC | PRN

## 2017-09-05 NOTE — Patient Instructions (Addendum)
You were seen in clinic today for mood changes.  We discussed the possible diagnosis of Bipolar Disorder based on your answers to the screening test.  I have referred you to [facility] and you met with Casimer Lanius today regarding this issue.  She will have you follow up with our behavioral health team for ongoing management of this.   Please call clinic with questions.   Be well,  Lovenia Kim, MD   Bipolar  Disorder Bipolar 1 disorder is a mental health disorder in which a person has episodes of emotional highs (mania), and may also have episodes of emotional lows (depression) in addition to highs. Bipolar 1 disorder is different from other bipolar disorders because it involves extreme manic episodes. These episodes last at least one week or involve symptoms that are so severe that hospitalization is needed to keep the person safe. What increases the risk? The cause of this condition is not known. However, certain factors make you more likely to have bipolar disorder, such as:  Having a family member with the disorder.  An imbalance of certain chemicals in the brain (neurotransmitters).  Stress, such as illness, financial problems, or a death.  Certain conditions that affect the brain or spinal cord (neurologic conditions).  Brain injury (trauma).  Having another mental health disorder, such as: ? Obsessive compulsive disorder. ? Schizophrenia.  What are the signs or symptoms? Symptoms of mania include:  Very high self-esteem or self-confidence.  Decreased need for sleep.  Unusual talkativeness or feeling a need to keep talking. Speech may be very fast. It may seem like you cannot stop talking.  Racing thoughts or constant talking, with quick shifts between topics that may or may not be related (flight of ideas).  Decreased ability to focus or concentrate.  Increased purposeful activity, such as work, studies, or social activity.  Increased nonproductive activity.  This could be pacing, squirming and fidgeting, or finger and toe tapping.  Impulsive behavior and poor judgment. This may result in high-risk activities, such as having unprotected sex or spending a lot of money.  Symptoms of depression include:  Feeling sad, hopeless, or helpless.  Frequent or uncontrollable crying.  Lack of feeling or caring about anything.  Sleeping too much.  Moving more slowly than usual.  Not being able to enjoy things you used to enjoy.  Wanting to be alone all the time.  Feeling guilty or worthless.  Lack of energy or motivation.  Trouble concentrating or remembering.  Trouble making decisions.  Increased appetite.  Thoughts of death, or the desire to harm yourself.  Sometimes, you may have a mixed mood. This means having symptoms of depression and mania. Stress can make symptoms worse. How is this diagnosed? To diagnose bipolar disorder, your health care provider may ask about your:  Emotional episodes.  Medical history.  Alcohol and drug use. This includes prescription medicines. Certain medical conditions and substances can cause symptoms that seem like bipolar disorder (secondary bipolar disorder).  How is this treated? Bipolar disorder is a long-term (chronic) illness. It is best controlled with ongoing (continuous) treatment rather than treatment only when symptoms occur. Treatment may include:  Medicine. Medicine can be prescribed by a provider who specializes in treating mental disorders (psychiatrist). ? Medicines called mood stabilizers are usually prescribed. ? If symptoms occur even while taking a mood stabilizer, other medicines may be added.  Psychotherapy. Some forms of talk therapy, such as cognitive-behavioral therapy (CBT), can provide support, education, and guidance.  Coping  methods, such as journaling or relaxation exercises. These may include: ? Yoga. ? Meditation. ? Deep breathing.  Lifestyle changes, such  as: ? Limiting alcohol and drug use. ? Exercising regularly. ? Getting plenty of sleep. ? Making healthy eating choices.  A combination of medicine, talk therapy, and coping methods is best. A procedure in which electricity is applied to the brain through the scalp (electroconvulsive therapy) may be used in cases of severe mania when medicine and psychotherapy work too slowly or do not work. Follow these instructions at home: Activity   Return to your normal activities as told by your health care provider.  Find activities that you enjoy, and make time to do them.  Exercise regularly as told by your health care provider. Lifestyle  Limit alcohol intake to no more than 1 drink a day for nonpregnant women and 2 drinks a day for men. One drink equals 12 oz of beer, 5 oz of wine, or 1 oz of hard liquor.  Follow a set schedule for eating and sleeping.  Eat a balanced diet that includes fresh fruits and vegetables, whole grains, low-fat dairy, and lean meat.  Get 7-8 hours of sleep each night. General instructions  Take over-the-counter and prescription medicines only as told by your health care provider.  Think about joining a support group. Your health care provider may be able to recommend a support group.  Talk with your family and loved ones about your treatment goals and how they can help.  Keep all follow-up visits as told by your health care provider. This is important. Where to find more information: For more information about bipolar disorder, visit the following websites:  [employer] on Mental Illness: www.[place]: https://carter.com/  Contact a health care provider if:  Your symptoms get worse.  You have side effects from your medicine, and they get worse.  You have trouble sleeping.  You have trouble doing daily activities.  You feel unsafe in your surroundings.  You are dealing with substance abuse. Get help  right away if:  You have new symptoms.  You have thoughts about harming yourself.  You self-harm. This information is not intended to replace advice given to you by your health care provider. Make sure you discuss any questions you have with your health care provider. Document Released: 03/05/2001 Document Revised: 07/23/2016 Document Reviewed: 07/27/2016 Elsevier Interactive Patient Education  [employer].

## 2017-09-05 NOTE — Progress Notes (Signed)
ESTIMATE TIME:15 minutes Type of Service: [place] warm handoff  Interpreter:No.    SUBJECTIVE: RIKIA SUKHU is a 56 y.o. female referred by Dr. Reesa Chew for: symptoms of Bipolar Disorder and getting patient connected to Dr. Gwenlyn Saran for ongoing evaluation.  Per PCP patient has a positive MDQ.  See PCP's not for details.  Patient is currently seen for the past two years at [facility] for therapy.     GOALS: Patient will reduce symptoms of: mood instability, and increase knowledge and ability SL:PNPYYF skills and self-management skills, . INTERVENTION: Reflective listening and [employer].   ISSUES DISCUSSED: coping skills, current treatment/therapy, and Integrated care services.    ASSESSMENT: Patient has a positive MDQ and may benefit from, and is in agreement to receive further assessment and evaluation for medication.  PCP would like patient to f/u with Dr. Gwenlyn Saran. Provided patient with Dr. Tod Persia business card.   PLAN:   1.Patient will F/U with Dr. Gwenlyn Saran  2. PCP will forward chart to Dr. Gwenlyn Saran  3. Patient will continue seeing therapist at [facility] Completed.     Casimer Lanius, LCSW Licensed Clinical Social Worker [place]   [phone] 3:41 PM
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
## 2017-09-07 DIAGNOSIS — R4586 Emotional lability: Secondary | ICD-10-CM | POA: Insufficient documentation

## 2017-09-07 NOTE — Assessment & Plan Note (Signed)
Taking Paxil x20 years.  Recent few months with increased mood changes, sees therapist who recommended evaluation for possible bipolar disorder.  -MDQ score given in clinic today - positive screen as patient indicates yes to >7 items in Q1, yes to Q2 and moderate problem to Q3.  -Discussed with Danielle Villa CSW who was able to meet with patient this afternoon and provide resources as well as contact information for Dr. Tod Persia office -patient is to arrange follow up with [facility] as outpatient  -will forward to Dr. Gwenlyn Saran to make aware and continue to follow

## 2017-09-14 ENCOUNTER — Encounter: Payer: Self-pay | Admitting: Family Medicine

## 2017-09-14 ENCOUNTER — Ambulatory Visit (INDEPENDENT_AMBULATORY_CARE_PROVIDER_SITE_OTHER): Payer: Medicaid Other | Admitting: Family Medicine

## 2017-09-14 VITALS — BP 112/75 | HR 65 | Temp 98.0°F | Ht 62.0 in | Wt 146.0 lb

## 2017-09-14 DIAGNOSIS — L308 Other specified dermatitis: Secondary | ICD-10-CM

## 2017-09-14 DIAGNOSIS — L430 Hypertrophic lichen planus: Secondary | ICD-10-CM | POA: Insufficient documentation

## 2017-09-14 DIAGNOSIS — L259 Unspecified contact dermatitis, unspecified cause: Secondary | ICD-10-CM | POA: Diagnosis not present

## 2017-09-14 MED ORDER — BETAMETHASONE DIPROPIONATE 0.05 % EX OINT: TOPICAL_OINTMENT | Freq: Two times a day (BID) | CUTANEOUS | 0 refills | Status: DC

## 2017-09-14 NOTE — Assessment & Plan Note (Addendum)
Subacute. Rash primarily pruritic and consistent with lichen planus. Uncertain etiology. Prior screening negative for hepatitis C virus. --Betamethasone 0.05% ointment to affected area twice daily until resolved with instructions to follow-up if not improved in 3 weeks --Recommending discontinuation of medications started within the past 2 months including trazodone and loratadine

## 2017-09-14 NOTE — Progress Notes (Signed)
   Subjective:   Patient ID: Danielle Villa    DOB: June 28, 1961, 56 y.o. female   MRN: [id number]  CC: "Rash all over body"  HPI: Danielle Villa is a 56 y.o. female who presents to clinic today for rash. Problems discussed today are as follows:  RASH  Had rash for 2 months. Location: started on wrists and spread to antecubital, axilla, waist Medications tried: None Similar rash in past: No New medications or antibiotics: No Tick, Insect or new pet exposure: No Recent travel: No New detergent or soap: No Immunocompromised: No  Symptoms Itching: Yes Pain over rash: No Feeling ill all over: No Fever: No Mouth sores: No Face or tongue swelling: No Trouble breathing: No Joint swelling or pain: No  Of not, patient recently started trazodone and loratadine over the past 2 months.   Complete ROS performed, see HPI for pertinent.  [place]: Onychomycosis, fibroids, panic attacks, HLD, constipation. Surgical history unremarkable. Family history HTN, DM. Smoking status reviewed. Medications reviewed.  Objective:   BP 112/75   Pulse 65   Temp 98 F (36.7 C) (Oral)   Ht 5\' 2"  (1.575 m)   Wt 146 lb (66.2 kg)   BMI 26.70 kg/m  Vitals and nursing note reviewed.  General: well nourished, well developed, in no acute distress with non-toxic appearance HEENT: normocephalic, atraumatic, moist mucous membranes, no lesions on mucous surfaces CV: regular rate and rhythm without murmurs, rubs, or gallops, no lower extremity edema Lungs: clear to auscultation bilaterally with normal work of breathing Abdomen: soft, non-tender, non-distended, no masses or organomegaly palpable, normoactive bowel sounds Skin: warm, dry, cap refill < 2 seconds, multiple small papular polygonal-like violaceous scaling rash across wrists, antecubital, axilla, waist and lower back, groin consistent with lichen planus Extremities: warm and well perfused, normal tone  Assessment & Plan:   Hypertrophic  lichen planus Subacute. Rash primarily pruritic and consistent with lichen planus. Uncertain etiology. Prior screening negative for hepatitis C virus. --Betamethasone 0.05% ointment to affected area twice daily until resolved with instructions to follow-up if not improved in 3 weeks --Recommending discontinuation of medications started within the past 2 months including trazodone and loratadine  No orders of the defined types were placed in this encounter.  Meds ordered this encounter  Medications  . betamethasone dipropionate (DIPROLENE) 0.05 % ointment    Sig: Apply topically 2 (two) times daily.    Dispense:  50 g    Refill:  0    Harriet Butte, DO [place], PGY-2 09/14/2017 9:21 AM

## 2017-09-14 NOTE — Patient Instructions (Signed)
Thank you for coming in to see [place] today. Please see below to review our plan for today's visit.  1. Please take the steroid cream I provided you twice daily until resolved. Avoid exposure to face. Discontinue new medications you started when the rash appeared. It is likely Lichen Planus, a reaction your body makes to a drug or virus. This should resolve over time. 2. Return to clinic if rash does not improve in 3 weeks.  Please call the clinic at [phone] if your symptoms worsen or you have any concerns. It was my pleasure to see you. -- Harriet Butte, [place], PGY-2   Lichen Planus Lichen planus is a skin problem. It causes redness, itching, small bumps, and sores. Areas of the body that are often affected include:  Arms, wrists, legs, or ankles.  Chest, back, or belly (abdomen).  Genital areas such as the vulva and vagina.  Gums and inside of the mouth.  Scalp.  Fingernails or toenails.  Treatment can help to control symptoms. This condition is not passed from one person to another (not contagious). It can last for a long time. Follow these instructions at home:  Take over-the-counter and prescription medicines only as told by your doctor.  Use creams or ointments as told by your doctor.  Do not scratch the affected areas of skin.  Women should keep the vagina as clean and dry as they can. Contact a doctor if:  Your redness, swelling, or pain gets worse.  You have fluid, blood, or pus coming from the affected area.  Your eyes become irritated. This information is not intended to replace advice given to you by your health care provider. Make sure you discuss any questions you have with your health care provider. Document Released: 11/09/2008 Document Revised: 05/04/2016 Document Reviewed: 02/22/2015 Elsevier Interactive Patient Education  [employer].

## 2017-09-17 ENCOUNTER — Telehealth: Payer: Self-pay | Admitting: *Deleted

## 2017-09-17 ENCOUNTER — Other Ambulatory Visit: Payer: Self-pay | Admitting: Family Medicine

## 2017-09-17 DIAGNOSIS — L430 Hypertrophic lichen planus: Secondary | ICD-10-CM

## 2017-09-17 MED ORDER — BETAMETHASONE VALERATE 0.1 % EX OINT: 1.0000 "application " | TOPICAL_OINTMENT | Freq: Two times a day (BID) | CUTANEOUS | 0 refills | Status: DC

## 2017-09-17 NOTE — Telephone Encounter (Signed)
Prior Authorization received from [employer] for betamethasone-dipropionate. Formulary and PA form placed in provider box for completion. Derl Barrow, RN

## 2017-10-09 ENCOUNTER — Other Ambulatory Visit: Payer: Self-pay | Admitting: Family Medicine

## 2017-10-10 ENCOUNTER — Ambulatory Visit (HOSPITAL_COMMUNITY)
Admission: RE | Admit: 2017-10-10 | Discharge: 2017-10-10 | Disposition: A | Discharge status: Home / Self Care | Payer: Medicaid Other | Source: Ambulatory Visit | Attending: Family Medicine | Admitting: Family Medicine

## 2017-10-10 ENCOUNTER — Ambulatory Visit (INDEPENDENT_AMBULATORY_CARE_PROVIDER_SITE_OTHER): Payer: Medicaid Other | Admitting: Family Medicine

## 2017-10-10 ENCOUNTER — Encounter: Payer: Self-pay | Admitting: Family Medicine

## 2017-10-10 VITALS — BP 118/72 | HR 78 | Temp 98.6°F | Wt 143.6 lb

## 2017-10-10 DIAGNOSIS — L430 Hypertrophic lichen planus: Secondary | ICD-10-CM

## 2017-10-10 DIAGNOSIS — R4586 Emotional lability: Secondary | ICD-10-CM | POA: Diagnosis not present

## 2017-10-10 DIAGNOSIS — M25641 Stiffness of right hand, not elsewhere classified: Secondary | ICD-10-CM

## 2017-10-10 DIAGNOSIS — M79641 Pain in right hand: Secondary | ICD-10-CM | POA: Diagnosis not present

## 2017-10-10 DIAGNOSIS — G8929 Other chronic pain: Secondary | ICD-10-CM | POA: Diagnosis not present

## 2017-10-10 MED ORDER — LORATADINE 10 MG PO TABS: 10.0000 mg | ORAL_TABLET | Freq: Every day | ORAL | 11 refills | Status: DC

## 2017-10-10 MED ORDER — QUETIAPINE FUMARATE 25 MG PO TABS: 25.0000 mg | ORAL_TABLET | Freq: Every day | ORAL | 1 refills | Status: DC

## 2017-10-10 MED ORDER — DICLOFENAC SODIUM 75 MG PO TBEC: 75.0000 mg | DELAYED_RELEASE_TABLET | Freq: Every morning | ORAL | 0 refills | Status: DC

## 2017-10-10 MED ORDER — TRAMADOL HCL 50 MG PO TABS: 50.0000 mg | ORAL_TABLET | Freq: Four times a day (QID) | ORAL | 3 refills | Status: DC | PRN

## 2017-10-10 MED ORDER — PAROXETINE HCL 10 MG PO TABS: 10.0000 mg | ORAL_TABLET | ORAL | 3 refills | Status: DC

## 2017-10-10 MED ORDER — ASPIRIN 81 MG PO TBEC: 81.0000 mg | DELAYED_RELEASE_TABLET | Freq: Every day | ORAL | 3 refills | Status: DC

## 2017-10-10 NOTE — Progress Notes (Signed)
Subjective:   Patient ID: Danielle Villa    DOB: 1961-01-11, 56 y.o. female   MRN: [id number]  CC: f/u mood disorder   HPI: Danielle Villa is a 56 y.o. female who presents to clinic today for f/u mood disorder and R hand stiffness.  Hypertrophic lichen planus Reports her lesions have scarred over and appear to be improving.  However, affected areas continue to be itchy.  No new areas of involvement noted.  She has been using the betamethasone ointment BID as prescribed by Dr. Yisroel Ramming.    Mood disturbance  Continues to experience racing thoughts.  Patient had a positive MDQ on last visit and was to follow up with Dr. Gwenlyn Saran for ongoing evaluation in addition to continue seeing therapist at [facility]  She states she has not followed up with Dr. Gwenlyn Saran and would like to start a medication today as recommended by her therapist.    Stiffness of R hand Patient notes she used to work at a [employer] in [place] prior to moving to this area.  She reports she had picked up a heavy accordion in 2013 and since then is unable to use her right hand as much secondary to pain. She is interested in seeing PT to regain some of her strength. States she is unable to make a fist.     ROS: Denies fevers, chills, nausea, vomiting, diarrhea.   [place]: Pertinent past medical, surgical, family, and social history were reviewed and updated as appropriate. Smoking status reviewed.  Medications reviewed. Objective:   BP 118/72   Pulse 78   Temp 98.6 F (37 C) (Oral)   Wt 143 lb 9.6 oz (65.1 kg)   SpO2 98%   BMI 26.26 kg/m  Vitals and nursing note reviewed.  General: 56 yo female, overly anxious, NAD  CV: RRR no MRG  Lungs: CTAB, non-laboured  Skin: warm, dry, no rash  Extremities: R hand without swelling or erythema, no evidence of dislocation or fracture, ROM normal and grip strength 5/5 bilaterally, sensation is intact  Neuro: alert, oriented x3,  no focal deficits    Assessment & Plan:   Chronic hand pain, right Patient reports arthritis for many years.  No prior imaging available.  Physical exam without red flags.  I do not suspect acute process at this time and no concerns for infection given afebrile and no overlying soft tissue warmth, swelling or erythema.  No history of recent injury and patient reports this is from injury lifting a heavy object.  Was -Patient is already taking numerous pain medication including Flexeril tramadol and diclofenac in addition to ibuprofen.  Would be hesitant to add another pain medication to her current regimen. - Have ordered right hand x-ray to evaluate for underlying pathology  -Will refer to PT to improve hand function - We will continue to monitor  Mood disturbance Patient reports ongoing symptoms of mania including difficulty sleeping and racing thoughts.  Would likely benefit from the addition of a mood stabilizer. - Plan: Start Seroquel 25 mg nightly -Recommend continued therapy with [place] [facility] -Return precautions discussed -Follow-up in 1 month.  Hypertrophic lichen planus Patient reports her lesions have scarred over and appear to be improving.  No new areas of involvement.  Reports ongoing itchiness of affected areas.  -Continue using betamethasone 0.05% ointment --May take Benadryl for symptoms of itching. --Follow-up in 1 month  Patient has brought a form to fill out and wishes for providers to fill in  list of medications and start dates.  She states this is for a court hearing regarding her disability and she will pick up the form next week.   Orders Placed This Encounter  Procedures  . DG Hand Complete Right    Standing Status:   Future    Number of Occurrences:   1    Standing Expiration Date:   12/10/2018    Order Specific Question:   Reason for Exam (SYMPTOM  OR DIAGNOSIS REQUIRED)    Answer:   R hand stiffness    Order Specific Question:   Is patient pregnant?    Answer:    No    Order Specific Question:   Preferred imaging location?    Answer:   [facility]    Order Specific Question:   Radiology Contrast Protocol - do NOT remove file path    Answer:   \\[email or web address].pdf  . Ambulatory referral to Physical Therapy    Referral Priority:   Routine    Referral Type:   Physical Medicine    Referral Reason:   Specialty Services Required    Requested Specialty:   Physical Therapy    Number of Visits Requested:   1   Meds ordered this encounter  Medications  . traMADol (ULTRAM) 50 MG tablet    Sig: Take 1 tablet (50 mg total) by mouth every 6 (six) hours as needed.    Dispense:  30 tablet    Refill:  3  . aspirin (ASPIRIN EC) 81 MG EC tablet    Sig: Take 1 tablet (81 mg total) by mouth daily.    Dispense:  30 tablet    Refill:  3  . diclofenac (VOLTAREN) 75 MG EC tablet    Sig: Take 1 tablet (75 mg total) by mouth every morning.    Dispense:  90 tablet    Refill:  0    Please consider 90 day supplies to promote better adherence  . loratadine (CLARITIN) 10 MG tablet    Sig: Take 1 tablet (10 mg total) by mouth daily.    Dispense:  30 tablet    Refill:  11  . PARoxetine (PAXIL) 10 MG tablet    Sig: Take 1 tablet (10 mg total) by mouth every morning.    Dispense:  30 tablet    Refill:  3  . QUEtiapine (SEROQUEL) 25 MG tablet    Sig: Take 1 tablet (25 mg total) by mouth at bedtime.    Dispense:  30 tablet    Refill:  1   Follow up: 1 month   Lovenia Kim, MD [place], PGY-2 10/16/2017 6:27 PM

## 2017-10-10 NOTE — Patient Instructions (Signed)
You have been started on a mood stabilizer called Seroquel.  Take this at bedtime each night.  You can follow up here or with your psychologist to be monitored for improvement in mood.  Additionally, for your right hand stiffness, I have ordered x-rays.  Go to the hospital Radiology department at [facility] for this.  You do not need an appointment.  I have referred you to physical therapy as well to help improve function in your hand.

## 2017-10-15 ENCOUNTER — Telehealth: Payer: Self-pay | Admitting: Family Medicine

## 2017-10-15 NOTE — Telephone Encounter (Signed)
Patient seen Dr. Reesa Chew on 10/31. She states she left disability forms to be completed by Dr. Reesa Chew. Dr. Reesa Chew asked patient to give her until the end of this week to complete. Patient now states that her attorney is requesting a copy of paperwork at their meeting on 11/6 @ 2:00Pm. Patient would like paperwork to be faxed to [phone] (even if not completed) by tomorrow at 2:00PM. She request a call when this has been completed.

## 2017-10-16 DIAGNOSIS — G8929 Other chronic pain: Secondary | ICD-10-CM | POA: Insufficient documentation

## 2017-10-16 DIAGNOSIS — M79641 Pain in right hand: Secondary | ICD-10-CM

## 2017-10-16 NOTE — Telephone Encounter (Signed)
I have discussed this with Suanne Marker.  She will need to sign a release form and then we can fax over the forms to them.  She has agreed to call Suanne Marker once she is at his office.

## 2017-10-16 NOTE — Assessment & Plan Note (Signed)
Patient reports ongoing symptoms of mania including difficulty sleeping and racing thoughts.  Would likely benefit from the addition of a mood stabilizer. - Plan: Start Seroquel 25 mg nightly -Recommend continued therapy with [place] [facility] -Return precautions discussed -Follow-up in 1 month.

## 2017-10-16 NOTE — Assessment & Plan Note (Signed)
Patient reports her lesions have scarred over and appear to be improving.  No new areas of involvement.  Reports ongoing itchiness of affected areas.  -Continue using betamethasone 0.05% ointment --May take Benadryl for symptoms of itching. --Follow-up in 1 month

## 2017-10-16 NOTE — Assessment & Plan Note (Signed)
Patient reports arthritis for many years.  No prior imaging available.  Physical exam without red flags.  I do not suspect acute process at this time and no concerns for infection given afebrile and no overlying soft tissue warmth, swelling or erythema.  No history of recent injury and patient reports this is from injury lifting a heavy object.  Was -Patient is already taking numerous pain medication including Flexeril tramadol and diclofenac in addition to ibuprofen.  Would be hesitant to add another pain medication to her current regimen. - Have ordered right hand x-ray to evaluate for underlying pathology  -Will refer to PT to improve hand function - We will continue to monitor

## 2017-10-29 ENCOUNTER — Other Ambulatory Visit: Payer: Self-pay | Admitting: Family Medicine

## 2017-10-29 ENCOUNTER — Telehealth: Payer: Self-pay | Admitting: Family Medicine

## 2017-10-29 ENCOUNTER — Ambulatory Visit: Payer: Medicaid Other | Attending: Family Medicine | Admitting: Physical Therapy

## 2017-10-29 DIAGNOSIS — M79641 Pain in right hand: Secondary | ICD-10-CM | POA: Insufficient documentation

## 2017-10-29 DIAGNOSIS — L430 Hypertrophic lichen planus: Secondary | ICD-10-CM

## 2017-10-29 DIAGNOSIS — M79643 Pain in unspecified hand: Secondary | ICD-10-CM

## 2017-10-29 DIAGNOSIS — G8929 Other chronic pain: Secondary | ICD-10-CM

## 2017-10-29 MED ORDER — BETAMETHASONE VALERATE 0.1 % EX OINT: 1.0000 "application " | TOPICAL_OINTMENT | Freq: Two times a day (BID) | CUTANEOUS | 0 refills | Status: DC

## 2017-10-29 NOTE — Telephone Encounter (Signed)
Pt informed. Deseree Blount, CMA  

## 2017-10-29 NOTE — Telephone Encounter (Signed)
I have placed a referral to OT for her hand.  Please inform patient that she should be hearing from them soon.

## 2017-10-29 NOTE — Therapy (Signed)
[place], [place], [address] Phone: [phone]   Fax:  [phone]  Patient Details  Name: Danielle Villa MRN: [id number] Date of Birth: 02-17-1961 Referring Provider:  Lovenia Kim, MD  Encounter Date: 10/29/2017   Patient arrived for PT eval but her referral was for hand stiffness. She was told she needs a OT referral.  MD was notified.    PAA,JENNIFER 10/29/2017, 9:48 AM  [place], [place], [address] Phone: [phone]   Fax:  [phone]

## 2017-10-29 NOTE — Telephone Encounter (Signed)
Where she was sent today ([facility]) , they said they cannot do OT for hand, (needs an OT evaluation for hand).  They can just do PT for knee  So, she will need a referral to go there ([facility]) for her knee.  Medicaid will only pay for one evaluation per year and patient would rather do that for her hand.

## 2017-10-30 ENCOUNTER — Telehealth: Payer: Self-pay | Admitting: Family Medicine

## 2017-10-30 ENCOUNTER — Other Ambulatory Visit: Payer: Self-pay | Admitting: Family Medicine

## 2017-10-30 NOTE — Telephone Encounter (Signed)
Pt called and said her lawyer needs all of her medical records from ever since she started having back pains and anxiety. Pt said she's been taking anxiety medications since the 90's and needs records since then. Pt said she came in yesterday to fill out a release of medical records. Pt lawyer needs these next Tuesday 11/27.

## 2017-11-06 ENCOUNTER — Ambulatory Visit (INDEPENDENT_AMBULATORY_CARE_PROVIDER_SITE_OTHER): Payer: Medicaid Other | Admitting: Family Medicine

## 2017-11-06 ENCOUNTER — Encounter: Payer: Self-pay | Admitting: Family Medicine

## 2017-11-06 ENCOUNTER — Other Ambulatory Visit: Payer: Self-pay

## 2017-11-06 VITALS — BP 108/62 | HR 80 | Temp 98.7°F | Ht 62.0 in | Wt 143.8 lb

## 2017-11-06 DIAGNOSIS — M79641 Pain in right hand: Secondary | ICD-10-CM | POA: Diagnosis not present

## 2017-11-06 DIAGNOSIS — Z0271 Encounter for disability determination: Secondary | ICD-10-CM

## 2017-11-06 DIAGNOSIS — G8929 Other chronic pain: Secondary | ICD-10-CM

## 2017-11-06 MED ORDER — CALCIUM CARBONATE-VITAMIN D 600-400 MG-UNIT PO TABS: 1.0000 | ORAL_TABLET | Freq: Every day | ORAL | 3 refills | Status: DC

## 2017-11-06 NOTE — Patient Instructions (Addendum)
You were seen in clinic for follow up for your disability paperwork and hand pain.  We discussed that your X-rays are normal.  I would like to send you to physical therapy for a FCE (functional capacity exam) for a more thorough evaluation.

## 2017-11-06 NOTE — Progress Notes (Signed)
Subjective:   Patient ID: Danielle Villa    DOB: 05-22-1961, 56 y.o. female   MRN: [id number]  CC: follow up x-rays, disability paperwork   HPI: Danielle Villa is a 56 y.o. female who presents to clinic today for follow up on x-rays.    Hand pain/stiffness  Patient reports joint stiffness and pain following a work injury from over 20 years ago.  XR imaging obtained last visit which showed no evidence of erosive arthropathy or bony abnormalities. Patient expresses frustration that her XR was normal as she states she is unable to use her hands to do work and needs disability.  Of note, she has not discussed hand pain in our previous visits however is increasingly irritated once a complaint she mentions is followed up with normal labs/imaging.  Patient has consistently demonstrated behavior in which she appears to be seeking ways to prove that she cannot work. She desires her office notes and records from [place] to prepare for an upcoming court hearing.   ROS: Denies fevers, chills, nausea, vomiting, abdominal pain.  Denies shortness of breath and chest pain.   [place]: Pertinent past medical, surgical, family, and social history were reviewed and updated as appropriate. Smoking status reviewed.  Medications reviewed. Objective:   BP 108/62   Pulse 80   Temp 98.7 F (37.1 C) (Oral)   Ht 5\' 2"  (1.575 m)   Wt 143 lb 12.8 oz (65.2 kg)   SpO2 99%   BMI 26.30 kg/m  Vitals and nursing note reviewed.  General: 56 yo female, NAD  CV: RRR no MRG  Lungs: CTAB, non-laboured Skin: warm, dry Extremities: warm, well-perfused. R hand - no warmth, erythema or swelling of wrist or small joints of hands.  ROM somewhat restricted due to pain. No tenderness to palpation over PIP or DIP joints.   Assessment & Plan:   Chronic hand pain, right Plain films show no evidence of erosive arthropathy or bony abnormality.  Given patient's frustration and defensiveness during encounter today, suspect she  is becoming desperate to prove disability.  Discussed with patient that she may require a FCE evaluation with PT in order to assess capability. -Referral placed to PT for functional capacity eval  Encounter for disability assessment Patient expresses frustration at negative imaging for her complaint of hand pain and on multiple occasions requesting that physician fills forms stating she is unable to walk several blocks without difficulty.  To my knowledge, she does not have difficulty doing so.  Have discussed case with Suanne Marker (clinic administrator) given patient has a longstanding history of applying for disability for multiple somatic complaints.  She now desires her office records from [place] to prepare for a court hearing.   - Staff will gather paperwork however discussed with patient that this will take about 2 weeks as older records are kept in storage.  She expresses understanding of this.  - Additionally, will refer her for functional capacity evaluation with PT   Orders Placed This Encounter  Procedures  . Ambulatory referral to Physical Therapy    Referral Priority:   Routine    Referral Type:   Physical Medicine    Referral Reason:   Specialty Services Required    Requested Specialty:   Physical Therapy    Number of Visits Requested:   1   Meds ordered this encounter  Medications  . Calcium Carbonate-Vitamin D (CALTRATE 600+D) 600-400 MG-UNIT tablet    Sig: Take 1 tablet by mouth daily.  Dispense:  30 tablet    Refill:  3   Follow up: 1 month   Lovenia Kim, MD [place], PGY-2 11/13/2017 7:51 PM

## 2017-11-13 DIAGNOSIS — Z0271 Encounter for disability determination: Secondary | ICD-10-CM | POA: Insufficient documentation

## 2017-11-13 NOTE — Assessment & Plan Note (Signed)
Patient expresses frustration at negative imaging for her complaint of hand pain and on multiple occasions requesting that physician fills forms stating she is unable to walk several blocks without difficulty.  To my knowledge, she does not have difficulty doing so.  Have discussed case with Suanne Marker (clinic administrator) given patient has a longstanding history of applying for disability for multiple somatic complaints.  She now desires her office records from [place] to prepare for a court hearing.   - Staff will gather paperwork however discussed with patient that this will take about 2 weeks as older records are kept in storage.  She expresses understanding of this.  - Additionally, will refer her for functional capacity evaluation with PT

## 2017-11-13 NOTE — Assessment & Plan Note (Signed)
Plain films show no evidence of erosive arthropathy or bony abnormality.  Given patient's frustration and defensiveness during encounter today, suspect she is becoming desperate to prove disability.  Discussed with patient that she may require a FCE evaluation with PT in order to assess capability. -Referral placed to PT for functional capacity eval

## 2017-11-28 ENCOUNTER — Encounter: Payer: Self-pay | Admitting: Internal Medicine

## 2017-11-28 ENCOUNTER — Other Ambulatory Visit: Payer: Self-pay

## 2017-11-28 ENCOUNTER — Ambulatory Visit: Payer: Medicaid Other | Admitting: Internal Medicine

## 2017-11-28 VITALS — BP 118/82 | HR 79 | Temp 98.4°F | Ht 62.0 in | Wt 143.4 lb

## 2017-11-28 DIAGNOSIS — R1032 Left lower quadrant pain: Secondary | ICD-10-CM | POA: Insufficient documentation

## 2017-11-28 DIAGNOSIS — Z0000 Encounter for general adult medical examination without abnormal findings: Secondary | ICD-10-CM | POA: Diagnosis not present

## 2017-11-28 DIAGNOSIS — Z23 Encounter for immunization: Secondary | ICD-10-CM

## 2017-11-28 DIAGNOSIS — R197 Diarrhea, unspecified: Secondary | ICD-10-CM | POA: Diagnosis not present

## 2017-11-28 NOTE — Patient Instructions (Addendum)
It was nice meeting you today Ms. Johanning!  I have placed a referral for your colonoscopy. You will be contacted with the date and time of this appointment.   If you have worsening symptoms, or begin to have vomiting, fevers, or worsening diarrhea, please let us know or go to the emergency room.   If you have any questions or concerns, please feel free to call the clinic.   Be well,  Dr. Avon Gully

## 2017-11-28 NOTE — Assessment & Plan Note (Signed)
Ongoing for 3 months. Some diarrhea but otherwise no accompanying symptoms. TTP in LLQ alone. Given chronicity of pain, reproducibility pain on palpation, as well as reported worsening of pain with walking/changes in position, most consistent with MSK etiology. Diverticulitis also on differential, however would expect more systemic symptoms like fevers and greater severity of pain. Does endorse some diarrhea, though no weight loss and no changes in appetite. Less likely obstruction as no vomiting or nausea. Patient also denying constipation. Do not think this is renal etiology as patient is concerned about as pain is located only in LLQ, she has no CVA tenderness on exam, is afebrile, and denies any urinary symptoms other than increased frequency which can be explained by her recent increase in water intake. Explained to patient why imaging is not indicated today. Discussed that she should continue current med regimen, and can take a Tylenol tablet if still having breakthrough pain with Tramadol. Encouraged use of heating pad in the affected area as well. Discussed return precautions.

## 2017-11-28 NOTE — Progress Notes (Signed)
   Subjective:   Patient: Danielle Villa: July 03, 1961       MRN: [id number]      HPI  GRETTEL Villa is a 56 y.o. female presenting for LLQ pain.   LLQ pain Ongoing for 3 months. Constant sharp. Cannot say why she came in today vs three months ago. "Googled on Youtube" and thinks she may have a kidney infection. Has not mentioned to her PCP at recent visits. Has been taking her regular pain meds (Tramadol) and says this does help with pain some. Pain is worse with movement, especially walking. Denies dysuria, hematuria. Endorses increased urinary frequency but says she has been drinking more water than usual. Endorses diarrhea. Denies vomiting, nausea, constipation. Endorses subjective fevers but has never measured her temperature. No change in appetite. No weight loss. Patient wants to know what imaging she should have done today.   Smoking status reviewed. Patient is never smoker.   Review of Systems See HPI.     Objective:  Physical Exam  Constitutional: She is oriented to person, place, and time and well-developed, well-nourished, and in no distress.  HENT:  Head: Normocephalic and atraumatic.  Cardiovascular: Normal rate, regular rhythm and normal heart sounds.  No murmur heard. Pulmonary/Chest: Effort normal and breath sounds normal. No respiratory distress. She has no wheezes.  Abdominal: Soft. Bowel sounds are normal. She exhibits no distension.  Endorses LLQ TTP; no grimacing, guarding, rebounding. No TTP elsewhere in abdomen. No CVA tenderness bilaterally.   Musculoskeletal:  Able to walk, sit, stand, and lay down on table without difficulty or assistance.   Neurological: She is alert and oriented to person, place, and time.  Skin: Skin is warm and dry.  Psychiatric: Affect and judgment normal.      Assessment & Plan:  Abdominal pain, left lower quadrant Ongoing for 3 months. Some diarrhea but otherwise no accompanying symptoms. TTP in LLQ alone.  Given chronicity of pain, reproducibility pain on palpation, as well as reported worsening of pain with walking/changes in position, most consistent with MSK etiology. Diverticulitis also on differential, however would expect more systemic symptoms like fevers and greater severity of pain. Does endorse some diarrhea, though no weight loss and no changes in appetite. Less likely obstruction as no vomiting or nausea. Patient also denying constipation. Do not think this is renal etiology as patient is concerned about as pain is located only in LLQ, she has no CVA tenderness on exam, is afebrile, and denies any urinary symptoms other than increased frequency which can be explained by her recent increase in water intake. Explained to patient why imaging is not indicated today. Discussed that she should continue current med regimen, and can take a Tylenol tablet if still having breakthrough pain with Tramadol. Encouraged use of heating pad in the affected area as well. Discussed return precautions.  Precepted with Dr. Erin Hearing.   Adin Hector, MD, MPH PGY-3 [facility] Pager [phone]

## 2017-12-18 ENCOUNTER — Other Ambulatory Visit: Payer: Self-pay | Admitting: Family Medicine

## 2017-12-19 ENCOUNTER — Ambulatory Visit (INDEPENDENT_AMBULATORY_CARE_PROVIDER_SITE_OTHER): Payer: Medicaid Other | Admitting: Family Medicine

## 2017-12-19 ENCOUNTER — Encounter: Payer: Self-pay | Admitting: Family Medicine

## 2017-12-19 ENCOUNTER — Other Ambulatory Visit: Payer: Self-pay

## 2017-12-19 VITALS — BP 112/72 | HR 71 | Temp 97.8°F | Wt 141.8 lb

## 2017-12-19 DIAGNOSIS — M7061 Trochanteric bursitis, right hip: Secondary | ICD-10-CM

## 2017-12-19 DIAGNOSIS — M7062 Trochanteric bursitis, left hip: Secondary | ICD-10-CM

## 2017-12-19 NOTE — Assessment & Plan Note (Signed)
Patient has chronic bilateral thigh pain over the trochanteric distribution.  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  Given chronicity and negative history of trauma I suspect that it is much more likely to be trochanteric bursitis instead of some underlying fracture especially given that she has full range of motion and no impairment with her joints noted on exam or in daily function of life.  Patient has tried multiple pain medications due to chronic pain from right hand without much improvement in her chronic hip pain from the same medications.This includes NSAIDs, Tylenol, tramadol.  Will refer patient to physical therapy for problem and have patient follow-up with PCP the following week which is already scheduled for further pain management as needed.

## 2017-12-19 NOTE — Progress Notes (Signed)
    Subjective:  Danielle Villa is a 57 y.o. female who presents to the [facility] today with a chief complaint of hip pain.   HPI: Hip pain,bilateral She has had the pain for about 1 year.  She endorses having has difficulty walking, but she is able to walk over 200 feet without the use of a cane or walker.  The pain is an 8 or 9 out of 10 at its worse.  The pain is worse with walking around and laying on her sides directly over the hip.The pain is worse in the morning, but better with her better with pain medicine. Heat applied to her hips helps as well.   She denies no known trauma or falls leading to injury.  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 Pt is concerned and wants xray.  She also pours rubbing alcohol on it after getting out of the shower. Epsin salts and hot baths make it better.  She denies any swelling.  The pain does radiate to her lower back.  She describes the pain as constant, deep, achy pain.there is no change in her need to go to the bathroom or urinate such as increased frequency.  She denies any recent weight loss. Pt also endorses having lower extremity cramps, "charlie horse".  She denies any weakness.   ROS: Per HPI  Objective:  Physical Exam: BP 112/72   Pulse 71   Temp 97.8 F (36.6 C) (Oral)   Wt 141 lb 12.8 oz (64.3 kg)   SpO2 98%   BMI 25.94 kg/m   Gen: NAD, resting comfortably CV: RRR with no murmurs appreciated Pulm: NWOB, CTAB with no crackles, wheezes, or rhonchi MSK: Normal get up and go out of chair, able to walk 10 feet with normal gait to be toe gait and heel gait, negative hop test with weightbearing on both legs, negative straight leg raise bilaterally, lateral thighs bilaterally are tender to palpation along the trochanteric distribution, there is some tenderness along her lower back as well, 5 of 5 strength throughout the entire lower extremity with normal coordination and  sensation throughout.  Normal range of motion of knees through flexion and extension normal range of motion of hip through flexion and extension. Skin: warm, dry  No results found for this or any previous visit (from the past 72 hour(s)).   Assessment/Plan:  Trochanteric bursitis of both hips Patient has chronic bilateral thigh pain over the trochanteric distribution.  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  Given chronicity and negative history of trauma I suspect that it is much more likely to be trochanteric bursitis instead of some underlying fracture especially given that she has full range of motion and no impairment with her joints noted on exam or in daily function of life.  Patient has tried multiple pain medications due to chronic pain from right hand without much improvement in her chronic hip pain from the same medications.This includes NSAIDs, Tylenol, tramadol.  Will refer patient to physical therapy for problem and have patient follow-up with PCP the following week which is already scheduled for further pain management as needed.   Lab Orders  No laboratory test(s) ordered today    No orders of the defined types were placed in this encounter.   Marny Lowenstein, MD, [place] - PGY1 12/19/2017 6:06 PM

## 2017-12-19 NOTE — Patient Instructions (Signed)
Danielle Villa was seen at the clinic today for 1 year of hip pain.  You have the diagnosis of trochanteric bursitis.  We will refer you to physical therapy for further workup and treatment.  Please follow-up with the clinic if you have worsening pain or pain that does not get better with rest or with medication.  Follow-up in clinic with your regular PCP next week.   Trochanteric Bursitis Trochanteric bursitis is a condition that causes hip pain. Trochanteric bursitis happens when fluid-filled sacs (bursae) in the hip get irritated. Normally these sacs absorb shock and help strong bands of tissue (tendons) in your hip glide smoothly over each other and over your hip bones. What are the causes? This condition results from increased friction between the hip bones and the tendons that go over them. This condition can happen if you:  Have weak hips.  Use your hip muscles too much (overuse).  Get hit in the hip.  What increases the risk? This condition is more likely to develop in:  Women.  Adults who are middle-aged or older.  People with arthritis or a spinal condition.  People with weak buttocks muscles (gluteal muscles).  People who have one leg that is shorter than the other.  People who participate in certain kinds of athletic activities, such as: ? Running sports, especially long-distance running. ? Contact sports, like football or martial arts. ? Sports in which falls may occur, like skiing.  What are the signs or symptoms? The main symptom of this condition is pain and tenderness over the point of your hip. The pain may be:  Sharp and intense.  Dull and achy.  Felt on the outside of your thigh.  It may increase when you:  Lie on your side.  Walk or run.  Go up on stairs.  Sit.  Stand up after sitting.  Stand for long periods of time.  How is this diagnosed? This condition may be diagnosed based on:  Your symptoms.  Your medical history.  A physical  exam.  Imaging tests, such as: ? X-rays to check your bones. ? An MRI or ultrasound to check your tendons and muscles.  During your physical exam, your health care provider will check the movement and strength of your hip. He or she may press on the point of your hip to check for pain. How is this treated? This condition may be treated by:  Resting.  Reducing your activity.  Avoiding activities that cause pain.  Using crutches, a cane, or a walker to decrease the strain on your hip.  Taking medicine to help with swelling.  Having medicine injected into the bursae to help with swelling.  Using ice, heat, and massage therapy for pain relief.  Physical therapy exercises for strength and flexibility.  Surgery (rare).  Follow these instructions at home: Activity  Rest.  Avoid activities that cause pain.  Return to your normal activities as told by your health care provider. Ask your health care provider what activities are safe for you. Managing pain, stiffness, and swelling  Take over-the-counter and prescription medicines only as told by your health care provider.  If directed, apply heat to the injured area as told by your health care provider. ? Place a towel between your skin and the heat source. ? Leave the heat on for 20-30 minutes. ? Remove the heat if your skin turns bright red. This is especially important if you are unable to feel pain, heat, or cold. You may have  a greater risk of getting burned.  If directed, apply ice to the injured area: ? Put ice in a plastic bag. ? Place a towel between your skin and the bag. ? Leave the ice on for 20 minutes, 2-3 times a day. General instructions  If the affected leg is one that you use for driving, ask your health care provider when it is safe to drive.  Use crutches, a cane, or a walker as told by your health care provider.  If one of your legs is shorter than the other, get fitted for a shoe insert.  Lose  weight if you are overweight. How is this prevented?  Wear supportive footwear that is appropriate for your sport.  If you have hip pain, start any new exercise or sport slowly.  Maintain physical fitness, including: ? Strength. ? Flexibility. Contact a health care provider if:  Your pain does not improve with 2-4 weeks. Get help right away if:  You develop severe pain.  You have a fever.  You develop increased redness over your hip.  You have a change in your bowel function or bladder function.  You cannot control the muscles in your feet. This information is not intended to replace advice given to you by your health care provider. Make sure you discuss any questions you have with your health care provider. Document Released: 01/04/2005 Document Revised: 08/02/2016 Document Reviewed: 11/12/2015 Elsevier Interactive Patient Education  [employer].

## 2017-12-31 ENCOUNTER — Other Ambulatory Visit: Payer: Self-pay

## 2017-12-31 ENCOUNTER — Ambulatory Visit (INDEPENDENT_AMBULATORY_CARE_PROVIDER_SITE_OTHER): Payer: Medicaid Other | Admitting: Family Medicine

## 2017-12-31 ENCOUNTER — Encounter: Payer: Self-pay | Admitting: Family Medicine

## 2017-12-31 ENCOUNTER — Encounter: Payer: Self-pay | Admitting: Psychology

## 2017-12-31 VITALS — BP 114/78 | HR 92 | Temp 98.3°F | Ht 62.0 in | Wt 143.4 lb

## 2017-12-31 DIAGNOSIS — R4586 Emotional lability: Secondary | ICD-10-CM | POA: Diagnosis not present

## 2017-12-31 DIAGNOSIS — M25641 Stiffness of right hand, not elsewhere classified: Secondary | ICD-10-CM | POA: Diagnosis not present

## 2017-12-31 DIAGNOSIS — G8929 Other chronic pain: Secondary | ICD-10-CM

## 2017-12-31 DIAGNOSIS — H538 Other visual disturbances: Secondary | ICD-10-CM | POA: Diagnosis present

## 2017-12-31 DIAGNOSIS — M79641 Pain in right hand: Secondary | ICD-10-CM

## 2017-12-31 MED ORDER — MELOXICAM 15 MG PO TABS: 15.0000 mg | ORAL_TABLET | Freq: Every day | ORAL | 3 refills | Status: DC

## 2017-12-31 MED ORDER — ASPIRIN 81 MG PO TBEC: 81.0000 mg | DELAYED_RELEASE_TABLET | Freq: Every day | ORAL | 3 refills | Status: DC

## 2017-12-31 MED ORDER — ATORVASTATIN CALCIUM 20 MG PO TABS: 20.0000 mg | ORAL_TABLET | Freq: Every day | ORAL | 2 refills | Status: DC

## 2017-12-31 NOTE — Patient Instructions (Addendum)
You were seen in clinic for ongoing wrist and knee pain.  I have discussed your co-pay with your pharmacy and restarted your Mobic 15 mg which I would like you to take daily.  Additionally, I have placed a referral to the ophthalmologist.  You can expect a call within a week or so regarding the appointment.  I have also sent in a referral to occupational therapy for your hand and you can set up a functional capacity exam with them.    Lastly, for your mood disorder, I have discussed your case behavioral health and they met with you briefly today.  I would like for you to arrange follow up with [facility] to evaluate you for medication management.

## 2017-12-31 NOTE — Progress Notes (Signed)
Dr. Reesa Chew requested a [place].   Presenting Issue:  Ms. Steinhaus reports physical problems with her hands and back and would like to pursue disability benefits. She was recently given a diagnosis of BPD due to a positive MDQ. See Notes of 09/05/17. She articulated her desire to change doctors due to her feeling that referrals and notes (for disability hearing) were not given promptly enough. She also expressed concern that an "observer" had been present during visits since her Bipolar diagnosis. I reassured her that this is a training clinic so it is not unusual to have an observer during a visit. She asked for information on how to change her doctor and I directed her to speak with the front desk on her way out.   Report of symptoms:  See notes of 09/05/17; patient is concerned that her current medication (Seroquel) is not working.  Duration of CURRENT symptoms:  20 years of anxiety symptoms; recently diagnosed with BD.  Age of onset of first mood disturbance:  38  Impact on function:  She states that she is disabled due to physical limitations.  Psychiatric History - Diagnoses: anxiety - Hospitalizations: not asked - Pharmacotherapy: seroquel now; paxil previously - Outpatient therapy: [facility]  Family history of psychiatric issues:  Not asked.  Current and history of substance use:  Not asked  Medical conditions that might explain or contribute to symptoms:  Many physical complaints concerning hand, back etc...; applying for disability benefits.  PHQ-9:  Not given due to time constraints GAD-7:  Same as above MDQ (if indicated):  Same as above  Assessment / Plan / Recommendations: This patient needs a thorough follow-up assessment for Bipolar Disorder. I provided support and reflective listening; I asked her to return to see me on February 4 at 3:00 p.m. I will give her the MDQ and other screening measures at that time. I will also consider whether  she is appropriate for referral to the [facility].  Warmhandoff completed.

## 2017-12-31 NOTE — Progress Notes (Signed)
Subjective:   Patient ID: Danielle Villa    DOB: 12-08-1961, 57 y.o. female   MRN: [id number]  CC: R hand pain/stiffness/mood disorder/blurry vision   HPI: Danielle Villa is a 57 y.o. female who presents to clinic today.  R hand pain and stiffness Has been an ongoing issue.  States she is unable to close her hand to make a fist which is impacting her daily activities.  She is upset that her referral placed last visit could not help her with her hand as I had thought it would. I had previously discussed this with the clinic manager and sent her for functional capacity exam as she is currently applying for disability.   I looked into this with the clinic manager and found that it is occupational therapy I would need to refer to.  She also would like to start taking Mobic again for her pain.      Vision issues -would like referral to eye doctor for blurry vision -she does not disclose very much additional information as she simply would like the referral.  Upon chart review, I have previously sent a referral about 6 months ago and unsure if she followed up to the appointment.    Mood disturbance -Reports Seroquel is not working and would like to change to another medication -she was recently diagnosed with bipolar disorder with MDQ, has not followed up with behavioral health in the meantime to make an appointment -is interested in speaking with someone from behavioral health today   ROS: Denies fevers, chills, nausea, vomiting, diarrhea.   [place]: Pertinent past medical, surgical, family, and social history were reviewed and updated as appropriate. Smoking status reviewed. Medications reviewed.  Objective:   BP 114/78   Pulse 92   Temp 98.3 F (36.8 C) (Oral)   Ht 5\' 2"  (1.575 m)   Wt 143 lb 6.4 oz (65 kg)   SpO2 98%   BMI 26.23 kg/m  Vitals and nursing note reviewed.  General: 57 yo female, NAD  CV: RRR no MRG  Lungs: CTAB, nonlaboured  Skin: warm, dry, no  rash Extremities: warm and well perfused, normal tone  Assessment & Plan:   Mood disturbance Refer to [facility] -- briefly seen by Larene Beach at today's visit and is to follow up on 01/14/2018.  Chronic hand pain, right Referral to OT placed for functional capacity exam  Patient states unable to close her right hand due to pain and stiffness.   However, upon leaving the office visit, clinic manager and myself noticed she is in fact able to close her fist to pick up her handbag.  Curious as to how much of this may be accurate as she is trying to file for disability and has been for the last year.  Do not believe disability is warranted here, however would strongly recommend a functional capacity exam and have placed this order several months ago.  Unfortunately, I did not realize that it is in fact occupational therapy that helps with this.  I have placed the correct referral today.  Patient expressed that she is upset this was not done earlier.  I informed her that the referral was indeed placed when she requested it and that we were not aware the functional test could not be performed there.   In the past, she has been trying to obtain medical records for her attorney and did not want to wait the 7 days we usually request from our patients for paperwork to be  taken care of.   Patient also expressed that she is upset she can only discuss 3-4 issues during our clinic visits.  I informed her that this is done so that we can cover the focused issues with more detail and provide better care. I have tried to redirect her focus multiple times during visits so that we can be more organized, however she begins to bring up different complaints while I am addressing others.  She  states she feels her issues are not paid attention to. For example, she is concerned she may have diabetes -- I have reassured her regarding this and have even ordered labs.  She has a normal Hgb A1c of 5.3 as of Feb 2018.  She wishes to see another  doctor from now on as she is not happy with this.  Clinic staff aware and is to schedule her with another provider for the rest of her visits.    Orders Placed This Encounter  Procedures  . Ambulatory referral to Ophthalmology    Referral Priority:   Routine    Referral Type:   Consultation    Referral Reason:   Specialty Services Required    Requested Specialty:   Ophthalmology    Number of Visits Requested:   1  . Ambulatory referral to Occupational Therapy    Referral Priority:   Routine    Referral Type:   Occupational Therapy    Referral Reason:   Specialty Services Required    Requested Specialty:   Occupational Therapy    Number of Visits Requested:   1   Meds ordered this encounter  Medications  . aspirin (ASPIRIN EC) 81 MG EC tablet    Sig: Take 1 tablet (81 mg total) by mouth daily.    Dispense:  30 tablet    Refill:  3  . atorvastatin (LIPITOR) 20 MG tablet    Sig: Take 1 tablet (20 mg total) by mouth daily.    Dispense:  30 tablet    Refill:  2    Please consider 90 day supplies to promote better adherence  . meloxicam (MOBIC) 15 MG tablet    Sig: Take 1 tablet (15 mg total) by mouth daily.    Dispense:  30 tablet    Refill:  3    Lovenia Kim, MD [place], PGY-2 01/02/2018 11:40 PM

## 2017-12-31 NOTE — Assessment & Plan Note (Signed)
°  Assessment / Plan / Recommendations: This patient needs a thorough follow-up assessment for Bipolar Disorder. I provided support and reflective listening; I asked her to return to see me on February 4 at 3:00 p.m. I will give her the MDQ and other screening measures at that time. I will also consider whether she is appropriate for referral to the [facility].

## 2018-01-02 NOTE — Assessment & Plan Note (Signed)
Refer to [facility] -- briefly seen by Larene Beach at today's visit and is to follow up on 01/14/2018.

## 2018-01-02 NOTE — Assessment & Plan Note (Signed)
Referral to OT placed for functional capacity exam

## 2018-01-04 ENCOUNTER — Encounter: Payer: Self-pay | Admitting: Family Medicine

## 2018-01-04 ENCOUNTER — Ambulatory Visit (INDEPENDENT_AMBULATORY_CARE_PROVIDER_SITE_OTHER): Payer: Medicaid Other | Admitting: Family Medicine

## 2018-01-04 DIAGNOSIS — J069 Acute upper respiratory infection, unspecified: Secondary | ICD-10-CM | POA: Diagnosis not present

## 2018-01-04 DIAGNOSIS — M7061 Trochanteric bursitis, right hip: Secondary | ICD-10-CM

## 2018-01-04 DIAGNOSIS — M7062 Trochanteric bursitis, left hip: Secondary | ICD-10-CM | POA: Diagnosis not present

## 2018-01-04 NOTE — Progress Notes (Signed)
   Subjective:   Patient ID: Danielle Villa    DOB: 02/02/1961, 57 y.o. female   MRN: [id number]  CC: Hip pain, flu-like symptoms  HPI: Danielle Villa is a 57 y.o. female who presents to clinic today for the following issues.  Cold/flulike symptoms Patient states her symptoms began Tuesday.  Endorses cough productive of thick yellow sputum.  She states she slept all day Tuesday and Wednesday.  Has had less of an appetite but she has been drinking fluids and staying well-hydrated.  She also endorses some congestion and sore throat.  It is not painful to swallow.  She has had headaches and body aches/chills.  She has felt subjectively warm but has not checked her temperatures at home.  She reports the father of her children has the flu and is been around all of her family member.  There are multiple family members with the same symptoms.  Had her flu shot this year.  Right-sided hip pain Patient states she was seen 2 weeks ago by Dr. Grandville Silos - Symptoms thought to be trochanteric bursitis. She has not had any improvement in her symptoms with conservative treatment. -She states pain is achy and constant and  she feels disabled due to physical limitations  ROS: Denies nausea, vomiting, abdominal pain, diarrhea.  Denies shortness of breath, chest pain, palpitations.  Social: Patient is a never smoker Medications reviewed. Objective:   BP 110/80 (BP Location: Left Arm, Patient Position: Sitting, Cuff Size: Normal)   Pulse 74   Temp 98.2 F (36.8 C) (Oral)   Ht 5\' 2"  (1.575 m)   Wt 140 lb 12.8 oz (63.9 kg)   SpO2 98%   BMI 25.75 kg/m  Vitals and nursing note reviewed.  General: 57 year old female, NAD HEENT: NCAT, EOMI, PERRLA, MMM, oropharynx is clear without tonsillar erythema or exudate, no rhinorrhea CV: RRR no MRG Lungs: CTA B, no wheeze or increased work of breathing Abdomen: soft, NT ND, positive bowel sounds MSK: Hip -  ROM restricted, difficult exam secondary to  pain Strength IR: 5/5, ER: 5/5, Flexion: 5/5, Extension: 5/5, Abduction: 5/5, Adduction: 5/5 Pelvic alignment unremarkable to inspection and palpation. Standing hip rotation and gait without trendelenburg / unsteadiness. Point tenderness over bilateral greater trochanters, R>L.  No SI joint tenderness and normal minimal SI movement.  Skin: warm, dry, no rash Extremities: warm and well perfused  Assessment & Plan:   Trochanteric bursitis of both hips Patient without relief despite conservative treatment.  Has not heard back from physical therapy although PT referral was placed at last visit about 3 weeks ago. -Upon further investigation, referral had been processed and were unable to reach patient by phone regarding scheduling -Have provided patient with phone number to call and make her appointment  Viral URI Patient with symptoms likely consistent with viral URI versus influenza.  Several family members with same symptoms at home.  No red flags on exam.  Discussed with patient that she is out of the 48-hour timeframe during which Tamiflu would be of benefit.  Discussed that antibiotics would not play a role as this is most likely viral. -Advised frequent handwashing to prevent spread -Recommend plenty of warm fluids and rest - Tylenol prn for fever or pain - NyQuil/DayQuil for symptomatic treatment -Return precautions discussed   Lovenia Kim, MD [place], PGY-2 01/08/2018 5:38 PM

## 2018-01-04 NOTE — Patient Instructions (Addendum)
You were seen in clinic for a cough and congestion which is most likely due to the flu or other viral upper respiratory tract infection.  As we discussed, antibiotics would not be beneficial in these cases.  I would recommend Tylenol for fever, plenty of warm fluids while you recover. For over the counter medications, I would recommend Robitussin or DayQuil/NyQuil to help you feel better.   I am including some information below for you to read if you would like.  It is important that you frequently wash your hands to prevent spread.  If you develop any new or worsening symptoms such as shortness of breath, recurrent fevers, or chest pain I would like for you to be seen by a provider again.  I hope you start to feel better soon.  Regarding your hip pain, we took a look at your referral that was placed by Dr. Grandville Silos and it seems they tried to reach out to you earlier.  We have given you the phone number to call back and you can schedule an appointment for physical therapy.    Be well, Lovenia Kim, MD    Viral Respiratory Infection A viral respiratory infection is an illness that affects parts of the body used for breathing, like the lungs, nose, and throat. It is caused by a germ called a virus. Some examples of this kind of infection are:  A cold.  The flu (influenza).  A respiratory syncytial virus (RSV) infection.  How do I know if I have this infection? Most of the time this infection causes:  A stuffy or runny nose.  Yellow or green fluid in the nose.  A cough.  Sneezing.  Tiredness (fatigue).  Achy muscles.  A sore throat.  Sweating or chills.  A fever.  A headache.  How is this infection treated? If the flu is diagnosed early, it may be treated with an antiviral medicine. This medicine shortens the length of time a person has symptoms. Symptoms may be treated with over-the-counter and prescription medicines, such as:  Expectorants. These make it easier to cough up  mucus.  Decongestant nasal sprays.  Doctors do not prescribe antibiotic medicines for viral infections. They do not work with this kind of infection. How do I know if I should stay home? To keep others from getting sick, stay home if you have:  A fever.  A lasting cough.  A sore throat.  A runny nose.  Sneezing.  Muscles aches.  Headaches.  Tiredness.  Weakness.  Chills.  Sweating.  An upset stomach (nausea).  Follow these instructions at home:  Rest as much as possible.  Take over-the-counter and prescription medicines only as told by your doctor.  Drink enough fluid to keep your pee (urine) clear or pale yellow.  Gargle with salt water. Do this 3-4 times per day or as needed. To make a salt-water mixture, dissolve -1 tsp of salt in 1 cup of warm water. Make sure the salt dissolves all the way.  Use nose drops made from salt water. This helps with stuffiness (congestion). It also helps soften the skin around your nose.  Do not drink alcohol.  Do not use tobacco products, including cigarettes, chewing tobacco, and e-cigarettes. If you need help quitting, ask your doctor. Get help if:  Your symptoms last for 10 days or longer.  Your symptoms get worse over time.  You have a fever.  You have very bad pain in your face or forehead.  Parts of your  jaw or neck become very swollen. Get help right away if:  You feel pain or pressure in your chest.  You have shortness of breath.  You faint or feel like you will faint.  You keep throwing up (vomiting).  You feel confused. This information is not intended to replace advice given to you by your health care provider. Make sure you discuss any questions you have with your health care provider. Document Released: 11/09/2008 Document Revised: 05/04/2016 Document Reviewed: 05/05/2015 Elsevier Interactive Patient Education  2018 [employer].

## 2018-01-08 DIAGNOSIS — J069 Acute upper respiratory infection, unspecified: Secondary | ICD-10-CM | POA: Insufficient documentation

## 2018-01-08 NOTE — Assessment & Plan Note (Signed)
Patient with symptoms likely consistent with viral URI versus influenza.  Several family members with same symptoms at home.  No red flags on exam.  Discussed with patient that she is out of the 48-hour timeframe during which Tamiflu would be of benefit.  Discussed that antibiotics would not play a role as this is most likely viral. -Advised frequent handwashing to prevent spread -Recommend plenty of warm fluids and rest - Tylenol prn for fever or pain - NyQuil/DayQuil for symptomatic treatment -Return precautions discussed

## 2018-01-08 NOTE — Assessment & Plan Note (Addendum)
Patient without relief despite conservative treatment.  Has not heard back from physical therapy although PT referral was placed at last visit about 3 weeks ago. -Upon further investigation, referral had been processed and were unable to reach patient by phone regarding scheduling -Have provided patient with phone number to call and make her appointment

## 2018-01-14 ENCOUNTER — Ambulatory Visit (INDEPENDENT_AMBULATORY_CARE_PROVIDER_SITE_OTHER): Payer: Medicaid Other | Admitting: Psychology

## 2018-01-14 DIAGNOSIS — R4586 Emotional lability: Secondary | ICD-10-CM

## 2018-01-14 NOTE — Progress Notes (Signed)
Reason for follow-up:  Danielle Villa is here for follow-up regarding her symptoms of anxiety and depression after a warm hand-off.  Issues discussed:  The patient is taking Seroquel, 75 mg. She does not feel that it is helping her and she does not like the side effects (drowsiness, "high feeling", loss of balance and memory problems). She would like to pursue yoga classes, so I gave her some information on free classes at the [facility] and the contact information for the [facility]. The patient asked why she was scheduled with me and I explained that I had offered this appointment to her during a warm hand-off.

## 2018-01-14 NOTE — Assessment & Plan Note (Addendum)
°  Assessment/Plan/Recommendations: The patient is experiencing moderately severe symptoms of depression (PHQ9 = 14, no SI, not difficult) and severe anxiety (GAD7 = 17, somewhat difficult). Her MDQ was negative because she stated that her symptoms were "no problem." However, she endorsed eight symptoms (hyper, self-confident, sleep, talkative, racing thoughts, energy, more social) and reported that these symptoms occurred at the same time. Tosca reports that anxiety is her primary concern, as she cannot concentrate.  We practiced diaphragmatic breathing and progressive muscle relaxation. We concluded with a meditation exercise ("leaves on a stream"); the patient reported feeling calmer. I told Yazlin to schedule a follow-up appointment with me as needed.

## 2018-01-16 ENCOUNTER — Ambulatory Visit: Payer: Medicaid Other | Attending: Family Medicine | Admitting: Occupational Therapy

## 2018-01-16 DIAGNOSIS — G8929 Other chronic pain: Secondary | ICD-10-CM | POA: Diagnosis present

## 2018-01-16 DIAGNOSIS — M25641 Stiffness of right hand, not elsewhere classified: Secondary | ICD-10-CM | POA: Diagnosis present

## 2018-01-16 DIAGNOSIS — M79641 Pain in right hand: Secondary | ICD-10-CM

## 2018-01-16 NOTE — Therapy (Addendum)
[facility] [address] [place], [place], [address] Phone: [phone]   Fax:  [phone]  Occupational Therapy Evaluation  Patient Details  Name: Danielle Villa MRN: [id number] Date of Birth: 1961-04-04 Referring Provider: Dr Amin/ Dr Mingo Amber   Encounter Date: 01/16/2018  OT End of Session - 01/16/18 1459    Visit Number  1    Number of Visits  4    Date for OT Re-Evaluation  02/15/18    Authorization Type  Medicaid await approval    OT Start Time  1450    OT Stop Time  1526    OT Time Calculation (min)  36 min    Activity Tolerance  Patient tolerated treatment well    Behavior During Therapy  [facility] for tasks assessed/performed       Past Medical History:  Diagnosis Date  . Back pain     No past surgical history on file.  There were no vitals filed for this visit.  Subjective Assessment - 01/16/18 1637    Subjective   Pt reports she injured her hand in 2013 in [place] and never received therapy.     Pertinent History  mood disorder, hypercholesterolemia, back pain, hand pain/ stiffness, see snapshot for additional diagnosis    Patient Stated Goals  improve functional use of right hand    Currently in Pain?  Yes    Pain Score  8  Pt rates pain 8/10 however pt does not appear to demonstrate pain at rest     Pain Location  Hand    Pain Orientation  Right    Pain Descriptors / Indicators  Aching    Pain Type  Chronic pain    Pain Onset  More than a month ago    Pain Frequency  Intermittent    Aggravating Factors   use    Pain Relieving Factors  heat        [facility] OT Assessment - 01/16/18 1500      Assessment   Medical Diagnosis  stiffness of right hand    Referring Provider  Dr Amin/ Dr Mingo Amber    Onset Date/Surgical Date  -- orininal injury 2013    Hand Dominance  Right    Prior Therapy  none      Precautions   Precautions  None      Home  Environment   Family/patient expects to be  discharged to:  Private residence      Prior Function   Level of Independence  Independent with basic ADLs;Independent with homemaking with ambulation    Vocation  Unemployed      ADL   ADL comments  modified independent with all basic ADLs- difficulty wiping during toileting reports difficulty opening containers, and carrying items      IADL   Shopping  Assistance for transportation    [place] alone or with occasional assistance    Meal Prep  Able to complete simple warm meal prep      Written Expression   Dominant Hand  Right    Handwriting  100% legible      Sensation   Light Touch  Impaired by gross assessment Pt reports numbness at times      Coordination   9 Hole Peg Test  Right;Left    Right 9 Hole Peg Test  34.31 secs    Left 9 Hole Peg Test  34.78 secs      ROM / Strength  AROM / PROM / Strength  AROM      AROM   Overall AROM   Deficits;Due to pain    Overall AROM Comments  Pt reports limitations in composite finger flexion with right hand however she demonstrates 90-95% composite flexion, Pt is able to perfrom finger thumb opposition with all digits.      Hand Function   Right Hand Grip (lbs)  5     Left Hand Grip (lbs)  25             Treatment: hotpack applied to right hand x 8 mins for pain relief, no adverse reactions. Pt was instructed in A/ROM and P/ROM composite finger flexion. She returned demonstration         OT Education - 01/16/18 1702    Education provided  Yes    Education Details  role of occupational therapy, importance of beginning A/ROM and P/ROM to digits to decrease stiffness, safe use of heat.    Person(s) Educated  Patient    Methods  Explanation;Demonstration;Verbal cues    Comprehension  Verbalized understanding          OT Long Term Goals - 01/16/18 1524      OT LONG TERM GOAL #1   Title  I with HEP for right hand ROM and strength    Baseline  dependent    Time  4    Period  Weeks     Status  New    Target Date  02/15/18      OT LONG TERM GOAL #2   Title  I with pain reduction strategies for right hand    Baseline  Pt rates pain 8/10 , dependent with strategies for pain reduction    Time  4    Period  Weeks    Status  New      OT LONG TERM GOAL #3   Title  Pt will demonstrate ability to open containers using her right hand and to carry a lightweight item in her right hand    Baseline  Pt reports increased difficulty opening containers  and carrying items with right hand    Time  4    Period  Weeks    Status  New            Plan - 01/16/18 1657    Clinical Impression Statement  Pt is a 57 y.o female referred for right hand pain and stiffness. Pt reports that she injured her hand in 2013 while working in [place]. Pt presents with reports of right hand pain, stiffness and decreased functional use. Pt can benefit from from skilled occupational therapy for education regarding pain reduction and HEP.    Occupational Profile and client history currently impacting functional performance  Pt reports she has been unable to work since 2013 when she injured her hand. Pt demonstrates inconsistencies in functional use as compared to standardized testing . PMH mood disorder, hypercholesterolemia, back pain, hand pain/ stiffness, see snapshot for additional diagnosis    Occupational performance deficits (Please refer to evaluation for details):  ADL's;IADL's;Work    Rehab Potential  Fair    Current Impairments/barriers affecting progress:  length of time since onset of injury, inconsistencies between functional use and standardized testing    OT Frequency  -- 3 visits x 1 month plus eval    OT Treatment/Interventions  Self-care/ADL training;Moist Heat;Fluidtherapy;DME and/or AE instruction;Therapeutic activities;Therapeutic exercise;Ultrasound;Cryotherapy;Iontophoresis;Neuromuscular education;Passive range of motion;Patient/family education;Manual Therapy;Paraffin    Plan   issue HEP  for ROM, putty, coordination     Clinical Decision Making  Limited treatment options, no task modification necessary    Consulted and Agree with Plan of Care  Patient       Patient will benefit from skilled therapeutic intervention in order to improve the following deficits and impairments:  Impaired flexibility, Pain, Decreased strength, Decreased range of motion, Impaired perceived functional ability  Visit Diagnosis: Pain in right hand - Plan: Ot plan of care cert/re-cert  Stiffness of right hand, not elsewhere classified - Plan: Ot plan of care cert/re-cert    Problem List Patient Active Problem List   Diagnosis Date Noted  . Viral URI 01/08/2018  . Trochanteric bursitis of both hips 12/19/2017  . Abdominal pain, left lower quadrant 11/28/2017  . Encounter for disability assessment 11/13/2017  . Chronic hand pain, right 10/16/2017  . Hypertrophic lichen planus 14/78/2956  . Mood disturbance 09/07/2017  . Left knee pain 07/04/2017  . Constipation 05/17/2017  . POLYURIA 12/29/2009  . ONYCHOMYCOSIS, TOENAILS 02/01/2009  . FIBROIDS, UTERUS 04/29/2008  . MENORRHAGIA 03/06/2008  . HYPERCHOLESTEROLEMIA 02/07/2007  . PANIC ATTACKS 02/07/2007  . BACK PAIN, LOW 02/07/2007  . INCONTINENCE, FECAL 02/07/2007    Willamae Demby 01/16/2018, 5:17 PM Theone Murdoch, OTR/L [phone] Phone: [phone] 5:17 PM 01/16/18 [facility] [address] [place], [place], [address] Phone: [phone]   Fax:  [phone]  Name: AHNYA AKRE MRN: [id number] Date of Birth: [birth year]

## 2018-01-19 ENCOUNTER — Encounter (HOSPITAL_COMMUNITY): Payer: Self-pay | Admitting: Emergency Medicine

## 2018-01-19 ENCOUNTER — Emergency Department (HOSPITAL_COMMUNITY)
Admission: EM | Admit: 2018-01-19 | Discharge: 2018-01-19 | Disposition: A | Discharge status: Home / Self Care | Payer: Medicaid Other | Attending: Emergency Medicine | Admitting: Emergency Medicine

## 2018-01-19 ENCOUNTER — Emergency Department (HOSPITAL_COMMUNITY): Payer: Medicaid Other

## 2018-01-19 ENCOUNTER — Other Ambulatory Visit: Payer: Self-pay

## 2018-01-19 DIAGNOSIS — R202 Paresthesia of skin: Secondary | ICD-10-CM | POA: Diagnosis not present

## 2018-01-19 DIAGNOSIS — F419 Anxiety disorder, unspecified: Secondary | ICD-10-CM | POA: Insufficient documentation

## 2018-01-19 DIAGNOSIS — Z7982 Long term (current) use of aspirin: Secondary | ICD-10-CM | POA: Diagnosis not present

## 2018-01-19 DIAGNOSIS — R079 Chest pain, unspecified: Secondary | ICD-10-CM

## 2018-01-19 DIAGNOSIS — Z79899 Other long term (current) drug therapy: Secondary | ICD-10-CM | POA: Diagnosis not present

## 2018-01-19 DIAGNOSIS — R51 Headache: Secondary | ICD-10-CM | POA: Insufficient documentation

## 2018-01-19 LAB — BASIC METABOLIC PANEL
ANION GAP: 13 (ref 5–15)
BUN: 13 mg/dL (ref 6–20)
CALCIUM: 9.2 mg/dL (ref 8.9–10.3)
CO2: 22 mmol/L (ref 22–32)
CREATININE: 0.57 mg/dL (ref 0.44–1.00)
Chloride: 102 mmol/L (ref 101–111)
GFR calc Af Amer: >60 mL/min (ref >60)
Glucose, Bld: 122 mg/dL — ABNORMAL HIGH (ref 65–99)
Potassium: 3.6 mmol/L (ref 3.5–5.1)
SODIUM: 137 mmol/L (ref 135–145)

## 2018-01-19 LAB — CBC
HCT: 37.5 % (ref 36.0–46.0)
HEMOGLOBIN: 12.1 g/dL (ref 12.0–15.0)
MCH: 29.1 pg (ref 26.0–34.0)
MCHC: 32.3 g/dL (ref 30.0–36.0)
MCV: 90.1 fL (ref 78.0–100.0)
Platelets: 351 10*3/uL (ref 150–400)
RBC: 4.16 MIL/uL (ref 3.87–5.11)
RDW: 14.6 % (ref 11.5–15.5)
WBC: 10 10*3/uL (ref 4.0–10.5)

## 2018-01-19 LAB — I-STAT TROPONIN, ED
TROPONIN I, POC: 0.02 ng/mL (ref 0.00–0.08)
TROPONIN I, POC: 0.01 ng/mL (ref 0.00–0.08)

## 2018-01-19 LAB — I-STAT BETA HCG BLOOD, ED (MC, WL, AP ONLY): I-stat hCG, quantitative: 5.1 m[IU]/mL — ABNORMAL HIGH (ref <5)

## 2018-01-19 LAB — POC URINE PREG, ED: Preg Test, Ur: NEGATIVE

## 2018-01-19 NOTE — ED Notes (Signed)
Pt ambulated to restroom with steady gait to collect urine sample 

## 2018-01-19 NOTE — ED Triage Notes (Signed)
PT called from front lobby
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
## 2018-01-19 NOTE — Discharge Instructions (Signed)
Return here for any worsening in your condition.  Follow-up with your primary doctor as soon as possible.

## 2018-01-19 NOTE — ED Notes (Signed)
Pt returned from CT °
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
## 2018-01-19 NOTE — ED Triage Notes (Signed)
Brought by ems from home.  C/o sudden onset of left sided chest pain that started at [place].  Reports the pain radiated into left arm and left side of body including leg.  Took Asa 324mg  and NTG SL X 1.  Reports no change in pain with medication.

## 2018-01-19 NOTE — ED Notes (Signed)
Patient transported to CT 

## 2018-01-23 ENCOUNTER — Encounter: Payer: Medicaid Other | Admitting: Occupational Therapy

## 2018-01-24 ENCOUNTER — Ambulatory Visit (INDEPENDENT_AMBULATORY_CARE_PROVIDER_SITE_OTHER): Payer: Medicaid Other | Admitting: Family Medicine

## 2018-01-24 ENCOUNTER — Other Ambulatory Visit: Payer: Self-pay

## 2018-01-24 ENCOUNTER — Encounter: Payer: Self-pay | Admitting: Family Medicine

## 2018-01-24 DIAGNOSIS — R4586 Emotional lability: Secondary | ICD-10-CM

## 2018-01-24 DIAGNOSIS — M25562 Pain in left knee: Secondary | ICD-10-CM | POA: Diagnosis not present

## 2018-01-24 DIAGNOSIS — G8929 Other chronic pain: Secondary | ICD-10-CM | POA: Diagnosis not present

## 2018-01-24 NOTE — Patient Instructions (Addendum)
It was a pleasure to see you today! Thank you for choosing [facility] for your primary care. Danielle Villa was seen for mood, knee pain.   Our plans for today were:  For your knee pain, try tylenol and heat first on bad days. If that doesn't help, try 1 aleeve. In the future, we can consider an injection.  For your mood, I think you need a little more in depth testing.  Call the Behavioral Medicine center to schedule an appointment. Their phone number is: [phone].  Best,  Dr. Lindell Noe
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
## 2018-01-27 NOTE — Assessment & Plan Note (Signed)
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 LCSW outpatient c/w bipolar. As such, will place psych referral. SSRI unopposed in bipolar would not be ideal, thus since patient is stable at present, will ask her to continue with seroquel at 25mg  despite some daytime sleepiness until she is able to be seen by psych. Patient on paxil for many years, counseled that this has several side effects and is on the beers list. Will attempt to change to alterate SSRI in the future.

## 2018-01-27 NOTE — Assessment & Plan Note (Signed)
Likely OA. Continue symptomatic treatment with tylenol and intermittent NSAIDs. Counseled that mobic carries GI risks long term. Patient will consider injection vs ortho referral in the future.

## 2018-01-28 ENCOUNTER — Ambulatory Visit: Payer: Medicaid Other | Admitting: Family Medicine

## 2018-01-28 DIAGNOSIS — H25811 Combined forms of age-related cataract, right eye: Secondary | ICD-10-CM | POA: Diagnosis not present

## 2018-01-30 ENCOUNTER — Ambulatory Visit: Payer: Medicaid Other | Admitting: Occupational Therapy

## 2018-01-30 DIAGNOSIS — M79641 Pain in right hand: Secondary | ICD-10-CM

## 2018-01-30 DIAGNOSIS — G8929 Other chronic pain: Secondary | ICD-10-CM

## 2018-01-30 DIAGNOSIS — M25641 Stiffness of right hand, not elsewhere classified: Secondary | ICD-10-CM

## 2018-01-30 NOTE — Patient Instructions (Addendum)
You can apply a microwave hot pack to your hand for 8-10 mins prior to exercising for stiffness. Do not burn yourself, exercise caution and remove if it feels too hot.      Flexor Tendon Gliding (Active Hook Fist)   With fingers and knuckles straight, bend middle and tip joints. Do not bend large knuckles. Repeat _10-15___ times. Do _4-6___ sessions per day.  MP Flexion (Active)   With back of hand on table, bend large knuckles as far as they will go, keeping small joints straight. Repeat _10-15___ times. Do __4-6__ sessions per day. Activity: Reach into a narrow container.*      Finger Flexion / Extension   With palm up, bend fingers of left hand toward palm, making a  fist. Straighten fingers, opening fist. Repeat sequence _10-15___ times per session. Do _4-6__ sessions per day. Hand Variation: Palm down   Copyright  VHI. All rights reserved.  1. Grip Strengthening (Resistive Putty)   Squeeze putty using thumb and all fingers. Repeat _20___ times. Do __2__ sessions per day.   2. Roll putty into tube on table and pinch between each finger and thumb x 10 reps each. (can do ring and small finger together)   3. Make a donut, place fingers of right hand inside stretch fingers into extension, 5-10 reps  4. Pull putty apart with both hands 5-10 reps   5. Hold a ball of putty in your hand push thumb down into putty 5-10 reps   6. Place a washcloth on the tabletop crumple it into your palm, then extend fingers to flatten it out, 5-10 reps 1x day  Try to use your right hand as much as possible for picking up items that are lightweight (NOT heavy, hot, sharp or breakable objects) Use your right hand for bathing and dressing.     Copyright  VHI. All rights reserved.

## 2018-01-30 NOTE — Therapy (Signed)
[facility] [address] [place], [place], [address] Phone: [phone]   Fax:  [phone]  Occupational Therapy Treatment  Patient Details  Name: Danielle Villa MRN: [id number] Date of Birth: 1961/07/29 Referring Provider: Dr Amin/ Dr Mingo Amber   Encounter Date: 01/30/2018  OT End of Session - 01/30/18 1415    Visit Number  2    Number of Visits  4    Date for OT Re-Evaluation  02/19/18 updated to match medicaid end date    Authorization Type  Medicaid    Authorization Time Period  3 visits through 02/19/18    OT Start Time  1405    OT Stop Time  1445    OT Time Calculation (min)  40 min    Activity Tolerance  Patient tolerated treatment well    Behavior During Therapy  [facility] for tasks assessed/performed       Past Medical History:  Diagnosis Date  . Back pain     No past surgical history on file.  There were no vitals filed for this visit.  Subjective Assessment - 01/30/18 1414    Subjective   Pt reports significant pain in hand    Patient Stated Goals  improve functional use of right hand    Currently in Pain?  Yes    Pain Score  7     Pain Location  Hand    Pain Orientation  Right    Pain Descriptors / Indicators  Aching    Pain Type  Chronic pain    Pain Onset  More than a month ago    Pain Frequency  Intermittent    Aggravating Factors   use    Pain Relieving Factors  heat    Multiple Pain Sites  No             Treatment: Placing/ removing grading clothespins from antennae with RUE, v.c initally, min difficulty/ fatigue              OT Education - 01/30/18 1428    Education provided  Yes    Education Details  Use of fluidotherapy x 12 mins to hand while exercising,- no adverse reactions  A/ROM tendon gliding, yellow putty exercise , initial HEP see pt instructions   Person(s) Educated  Patient    Methods  Explanation;Demonstration;Verbal cues;Handout    Comprehension   Verbalized understanding;Returned demonstration;Verbal cues required - min v.c, demonstration         OT Long Term Goals - 01/16/18 1524      OT LONG TERM GOAL #1   Title  I with HEP for right hand ROM and strength    Baseline  dependent    Time  4    Period  Weeks    Status  New    Target Date  02/15/18      OT LONG TERM GOAL #2   Title  I with pain reduction strategies for right hand    Baseline  Pt rates pain 8/10 , dependent with strategies for pain reduction    Time  4    Period  Weeks    Status  New      OT LONG TERM GOAL #3   Title  Pt will demonstrate ability to open containers using her right hand and to carry a lightweight item in her right hand    Baseline  Pt reports increased difficulty opening containers  and carrying items with right hand  Time  4    Period  Weeks    Status  New            Plan - 01/30/18 1439    Clinical Impression Statement  Pt is progressing towards goals. she demonstrates improving A/ROM and strength today.    Occupational Profile and client history currently impacting functional performance  Pt reports she has been unable to work since 2013 when she injured her hand. Pt demonstrates inconsistencies in functional use as compared to standardized testing . PMH mood disorder, hypercholesterolemia, back pain, hand pain/ stiffness, see snapshot for additional diagnosis    OT Treatment/Interventions  Self-care/ADL training;Moist Heat;Fluidtherapy;DME and/or AE instruction;Therapeutic activities;Therapeutic exercise;Ultrasound;Cryotherapy;Iontophoresis;Neuromuscular education;Passive range of motion;Patient/family education;Manual Therapy;Paraffin    Plan  check on HEP, progress as able, functional use of RUE    Consulted and Agree with Plan of Care  Patient       Patient will benefit from skilled therapeutic intervention in order to improve the following deficits and impairments:  Impaired flexibility, Pain, Decreased strength, Decreased  range of motion, Impaired perceived functional ability  Visit Diagnosis: Pain in right hand  Stiffness of right hand, not elsewhere classified  Chronic hand pain, right    Problem List Patient Active Problem List   Diagnosis Date Noted  . Viral URI 01/08/2018  . Trochanteric bursitis of both hips 12/19/2017  . Abdominal pain, left lower quadrant 11/28/2017  . Encounter for disability assessment 11/13/2017  . Chronic hand pain, right 10/16/2017  . Hypertrophic lichen planus 39/53/2023  . Mood disturbance 09/07/2017  . Left knee pain 07/04/2017  . Constipation 05/17/2017  . POLYURIA 12/29/2009  . ONYCHOMYCOSIS, TOENAILS 02/01/2009  . FIBROIDS, UTERUS 04/29/2008  . MENORRHAGIA 03/06/2008  . HYPERCHOLESTEROLEMIA 02/07/2007  . PANIC ATTACKS 02/07/2007  . BACK PAIN, LOW 02/07/2007  . INCONTINENCE, FECAL 02/07/2007    Danielle Villa 01/30/2018, 2:40 PM Theone Murdoch, OTR/L [phone] Phone: [phone] 3:17 PM 01/30/18 [facility] [address] [place], [place], [address] Phone: [phone]   Fax:  [phone]  Name: Danielle Villa MRN: [id number] Date of Birth: May 23, 1961

## 2018-02-01 ENCOUNTER — Ambulatory Visit: Payer: Medicaid Other | Admitting: Family Medicine

## 2018-02-06 ENCOUNTER — Ambulatory Visit: Payer: Medicaid Other | Admitting: Occupational Therapy

## 2018-02-06 DIAGNOSIS — M79641 Pain in right hand: Secondary | ICD-10-CM

## 2018-02-06 DIAGNOSIS — M25641 Stiffness of right hand, not elsewhere classified: Secondary | ICD-10-CM

## 2018-02-06 NOTE — Therapy (Signed)
[facility] [address] [place], [place], [address] Phone: [phone]   Fax:  [phone]  Occupational Therapy Treatment  Patient Details  Name: Danielle Villa MRN: [id number] Date of Birth: 1961-11-30 Referring Provider: Dr Amin/ Dr Mingo Amber   Encounter Date: 02/06/2018  OT End of Session - 02/06/18 1408    Visit Number  3    Number of Visits  4    Date for OT Re-Evaluation  02/19/18    Authorization Type  Medicaid    Authorization Time Period  3 visits through 02/19/18    Authorization - Visit Number  2    Authorization - Number of Visits  3    OT Start Time  7169    OT Stop Time  1445    OT Time Calculation (min)  42 min    Activity Tolerance  Patient tolerated treatment well    Behavior During Therapy  [facility] for tasks assessed/performed       Past Medical History:  Diagnosis Date  . Back pain     No past surgical history on file.  There were no vitals filed for this visit.  Subjective Assessment - 02/06/18 1407    Subjective   Pt reports she has been using her hand more    Pertinent History  mood disorder, hypercholesterolemia, back pain, hand pain/ stiffness, see snapshot for additional diagnosis    Patient Stated Goals  improve functional use of right hand    Currently in Pain?  Yes    Pain Score  8     Pain Location  Hand    Pain Orientation  Right    Pain Descriptors / Indicators  Aching    Pain Type  Chronic pain    Pain Onset  More than a month ago    Pain Frequency  Intermittent    Aggravating Factors   use    Pain Relieving Factors  heat    Multiple Pain Sites  No              Treatment: Fluidotherapy x 12 mins to RUE for pain and stiffness, with pt. performing A/ROM finger flexion/ extension, no adverse reactions. Tendon gliding exercises x 10 reps min v.c for positioning. Upgraded putty to red for sustained grip and pinch. Graded clothespins for sustained pinch 1-8 lbs  resistance, pt demonstrates good performance. Gripper set at level 1 for sustained grip, min difficulty. Pt demonstrates at least 95% composite finger flexion today. Therapist discussed with pt that next visit would be her last.                  OT Long Term Goals - 01/16/18 1524      [place] #1   Title  I with HEP for right hand ROM and strength    Baseline  dependent    Time  4    Period  Weeks    Status  New    Target Date  02/15/18      OT LONG TERM GOAL #2   Title  I with pain reduction strategies for right hand    Baseline  Pt rates pain 8/10 , dependent with strategies for pain reduction    Time  4    Period  Weeks    Status  New      OT LONG TERM GOAL #3   Title  Pt will demonstrate ability to open containers using her right hand and to  carry a lightweight item in her right hand    Baseline  Pt reports increased difficulty opening containers  and carrying items with right hand    Time  4    Period  Weeks    Status  New            Plan - 02/06/18 1409    Clinical Impression Statement  Pt is progressing towards goals. She reports improving ability to use RUE for functional tasks    Rehab Potential  Fair    Current Impairments/barriers affecting progress:  length of time since onset of injury, inconsistencies between functional use and standardized testing    OT Frequency  -- 3 visits plus eval x 1 month    OT Treatment/Interventions  Self-care/ADL training;Moist Heat;Fluidtherapy;DME and/or AE instruction;Therapeutic activities;Therapeutic exercise;Ultrasound;Cryotherapy;Iontophoresis;Neuromuscular education;Passive range of motion;Patient/family education;Manual Therapy;Paraffin    Plan   progress HEP  as able, functional use of RUE    Consulted and Agree with Plan of Care  Patient       Patient will benefit from skilled therapeutic intervention in order to improve the following deficits and impairments:  Impaired flexibility, Pain,  Decreased strength, Decreased range of motion, Impaired perceived functional ability  Visit Diagnosis: Stiffness of right hand, not elsewhere classified  Pain in right hand    Problem List Patient Active Problem List   Diagnosis Date Noted  . Viral URI 01/08/2018  . Trochanteric bursitis of both hips 12/19/2017  . Abdominal pain, left lower quadrant 11/28/2017  . Encounter for disability assessment 11/13/2017  . Chronic hand pain, right 10/16/2017  . Hypertrophic lichen planus 62/02/5596  . Mood disturbance 09/07/2017  . Left knee pain 07/04/2017  . Constipation 05/17/2017  . POLYURIA 12/29/2009  . ONYCHOMYCOSIS, TOENAILS 02/01/2009  . FIBROIDS, UTERUS 04/29/2008  . MENORRHAGIA 03/06/2008  . HYPERCHOLESTEROLEMIA 02/07/2007  . PANIC ATTACKS 02/07/2007  . BACK PAIN, LOW 02/07/2007  . INCONTINENCE, FECAL 02/07/2007    Frankey Botting 02/06/2018, 2:10 PM Theone Murdoch, OTR/L [phone] Phone: [phone] 8:12 AM 02/07/18 [facility] [address] [place], [place], [address] Phone: [phone]   Fax:  [phone]  Name: Danielle Villa MRN: [id number] Date of Birth: [birth year]

## 2018-02-07 ENCOUNTER — Encounter: Payer: Self-pay | Admitting: Occupational Therapy

## 2018-02-13 ENCOUNTER — Ambulatory Visit: Payer: Medicaid Other | Attending: Family Medicine | Admitting: Occupational Therapy

## 2018-02-13 DIAGNOSIS — M79641 Pain in right hand: Secondary | ICD-10-CM | POA: Insufficient documentation

## 2018-02-13 DIAGNOSIS — G8929 Other chronic pain: Secondary | ICD-10-CM | POA: Insufficient documentation

## 2018-02-13 DIAGNOSIS — M25641 Stiffness of right hand, not elsewhere classified: Secondary | ICD-10-CM | POA: Diagnosis not present

## 2018-02-13 NOTE — Therapy (Signed)
[facility] [address] [place], [place], [address] Phone: [phone]   Fax:  [phone]  Occupational Therapy Treatment  Patient Details  Name: Danielle Villa MRN: [id number] Date of Birth: 11-Jul-1961 Referring Provider: Dr Amin/ Dr Mingo Amber   Encounter Date: 02/13/2018  OT End of Session - 02/13/18 1507    Visit Number  4    Number of Visits  4    Date for OT Re-Evaluation  02/19/18    Authorization Type  Medicaid    Authorization Time Period  3 visits through 02/19/18    Authorization - Visit Number  3    Authorization - Number of Visits  3    OT Start Time  2426    OT Stop Time  1530    OT Time Calculation (min)  43 min       Past Medical History:  Diagnosis Date  . Back pain     No past surgical history on file.  There were no vitals filed for this visit.  Subjective Assessment - 02/13/18 1523    Subjective   Pt reports she has not exercised today    Pertinent History  mood disorder, hypercholesterolemia, back pain, hand pain/ stiffness, see snapshot for additional diagnosis    Patient Stated Goals  improve functional use of right hand    Currently in Pain?  Yes    Pain Score  8     Pain Location  Hand    Pain Orientation  Right    Pain Descriptors / Indicators  Aching    Pain Type  Chronic pain    Pain Onset  More than a month ago    Pain Frequency  Intermittent    Aggravating Factors   use    Pain Relieving Factors  heat    Effect of Pain on Daily Activities  Pt reports pain limits daily activities. Pt reports pain is 7-8/10 however pt does not appear to be in pain with functional tasks in therapy.             Treatment: Paraffin x 10 mins to RUE for pain and stiffness, no adverse reactions. Pt was instructed that if paraffin helps with her pain reduction she can order a unit online of purchase at [facility] for home use.Followed by review of tendon gliding exercises and A/ROM/P/ROM   composite flexion. Pt returned demonstration .Therapist checked progress towards goals. Graded clothespins for sustained pinch, no significant difficulty. Gripper set at level 1 to pick up blocks, with only minimal  difficulty. Pt attempted level 2 however she reported that it was too challenging and hurt her hand, so pt continued task at level 1.  Reviewed red putty HEP, min v.c then pt returned demonstration.               OT Education - 02/14/18 1158    Education provided  Yes    Education Details  Reviewed red putty, tendon gliding exercises, composite flexion P/ROM, importance of using hand for light functional tasks    Person(s) Educated  Patient    Methods  Explanation;Demonstration;Verbal cues    Comprehension  Verbalized understanding;Returned demonstration          OT Long Term Goals - 02/13/18 1508      OT LONG TERM GOAL #1   Title  I with HEP for right hand ROM and strength    Status  Achieved      OT LONG TERM GOAL #2  Title  I with pain reduction strategies for right hand    Status  Achieved paraffin, hotpack, exercise      OT LONG TERM GOAL #3   Title  Pt will demonstrate ability to open containers using her right hand and to carry a lightweight item in her right hand    Status  Achieved Pt carried a 4 lbs weight in RUE 115 ft without difficulty. Pt opened a plastic container and unscrewed the lid to a spice jar            Plan - 02/13/18 1512    Clinical Impression Statement  Pt demonstrates excellent overall progress. She has been encouraged to use her right hand for daily activities. Pt demonstrates at least 95% composite finger flexion and improved ability to use RUE for light functional tasks. Pt achieved long term goals.    Occupational Profile and client history currently impacting functional performance  Pt reports she has been unable to work since 2013 when she injured her hand. Pt demonstrates inconsistencies in functional use as compared  to standardized testing . PMH mood disorder, hypercholesterolemia, back pain, hand pain/ stiffness, see snapshot for additional diagnosis    Rehab Potential  Fair    Current Impairments/barriers affecting progress:  length of time since onset of injury, inconsistencies between functional use and standardized testing    OT Frequency  -- 3 visits plus 1 eva    OT Treatment/Interventions  Self-care/ADL training;Moist Heat;Fluidtherapy;DME and/or AE instruction;Therapeutic activities;Therapeutic exercise;Ultrasound;Cryotherapy;Iontophoresis;Neuromuscular education;Passive range of motion;Patient/family education;Manual Therapy;Paraffin    Plan  d/c OT pt has been issued HEP, no further visits needed at this time    OT Home Exercise Plan  putty, A/ROM, importance of using RUE for functional activity    Consulted and Agree with Plan of Care  Patient       Patient will benefit from skilled therapeutic intervention in order to improve the following deficits and impairments:  Impaired flexibility, Pain, Decreased strength, Decreased range of motion, Impaired perceived functional ability  Visit Diagnosis: Stiffness of right hand, not elsewhere classified  Pain in right hand  Chronic hand pain, right   OCCUPATIONAL THERAPY DISCHARGE SUMMARY   Current functional level related to goals / functional outcomes: Pt met all long term goals. She demonstrates grossly 95% composite finger flexion and is able to use RUE for light functional tasks.   Remaining deficits: Pain, decreased strength   Education / Equipment: Pt achieved all goals. She demonstrates ability to use her RUE for light functional tasks. Pt reports significant pain, however she does not demonstrate grimaces or evidence of significant pain with light functional tasks. Pt was encouraged to continue using her RUE at home and to continue performing HEP. No additional therapy visits needed at this time. Plan: Patient agrees to discharge.   Patient goals were met. Patient is being discharged due to meeting the stated rehab goals.  ?????      Problem List Patient Active Problem List   Diagnosis Date Noted  . Viral URI 01/08/2018  . Trochanteric bursitis of both hips 12/19/2017  . Abdominal pain, left lower quadrant 11/28/2017  . Encounter for disability assessment 11/13/2017  . Chronic hand pain, right 10/16/2017  . Hypertrophic lichen planus 16/06/3709  . Mood disturbance 09/07/2017  . Left knee pain 07/04/2017  . Constipation 05/17/2017  . POLYURIA 12/29/2009  . ONYCHOMYCOSIS, TOENAILS 02/01/2009  . FIBROIDS, UTERUS 04/29/2008  . MENORRHAGIA 03/06/2008  . HYPERCHOLESTEROLEMIA 02/07/2007  . PANIC ATTACKS 02/07/2007  .  BACK PAIN, LOW 02/07/2007  . INCONTINENCE, FECAL 02/07/2007    RINE,KATHRYN 02/14/2018, 12:00 PM Theone Murdoch, OTR/L [phone] Phone: [phone] 12:00 PM 02/14/18 [facility] [address] [place], [place], [address] Phone: [phone]   Fax:  [phone]  Name: Danielle Villa MRN: [id number] Date of Birth: 1961-09-11

## 2018-02-26 ENCOUNTER — Other Ambulatory Visit: Payer: Self-pay

## 2018-02-26 ENCOUNTER — Encounter: Payer: Self-pay | Admitting: Student

## 2018-02-26 ENCOUNTER — Ambulatory Visit (HOSPITAL_COMMUNITY)
Admission: RE | Admit: 2018-02-26 | Discharge: 2018-02-26 | Disposition: A | Discharge status: Home / Self Care | Payer: Medicaid Other | Source: Ambulatory Visit | Attending: Family Medicine | Admitting: Family Medicine

## 2018-02-26 ENCOUNTER — Ambulatory Visit (INDEPENDENT_AMBULATORY_CARE_PROVIDER_SITE_OTHER): Payer: Medicaid Other | Admitting: Student

## 2018-02-26 VITALS — BP 118/62 | HR 82 | Temp 98.4°F | Ht 62.0 in | Wt 138.2 lb

## 2018-02-26 DIAGNOSIS — G8929 Other chronic pain: Secondary | ICD-10-CM

## 2018-02-26 DIAGNOSIS — M25552 Pain in left hip: Secondary | ICD-10-CM

## 2018-02-26 DIAGNOSIS — M25551 Pain in right hip: Secondary | ICD-10-CM | POA: Insufficient documentation

## 2018-02-26 MED ORDER — NAPROXEN 500 MG PO TABS
500.0000 mg | ORAL_TABLET | Freq: Two times a day (BID) | ORAL | 0 refills | Status: DC
Start: 2018-02-26 — End: 2018-07-27

## 2018-02-26 NOTE — Patient Instructions (Signed)
It was great seeing you today! We have addressed the following issues today  Hip pain: This is likely due to arthritis, may be some bursitis as well.  We ordered an x-ray of your hip.  You can have these done at [facility] today.  Also sent a prescription for naproxen to your pharmacy.  You can take this medication twice a day with food for 5 days and then as needed for pain.  If we did any lab work today, and the results require attention, either me or my nurse will get in touch with you. If everything is normal, you will get a letter in mail and a message via . If you don't hear from [place] in two weeks, please give [place] a call. Otherwise, we look forward to seeing you again at your next visit. If you have any questions or concerns before then, please call the clinic at [phone].  Please bring all your medications to every doctors visit  Sign up for My Chart to have easy access to your labs results, and communication with your Primary care physician.    Please check-out at the front desk before leaving the clinic.    Take Care,   Dr. Cyndia Skeeters   Osteoarthritis Osteoarthritis is a type of arthritis that affects tissue that covers the ends of bones in joints (cartilage). Cartilage acts as a cushion between the bones and helps them move smoothly. Osteoarthritis results when cartilage in the joints gets worn down. Osteoarthritis is sometimes called "wear and tear" arthritis. Osteoarthritis is the most common form of arthritis. It often occurs in older people. It is a condition that gets worse over time (a progressive condition). Joints that are most often affected by this condition are in:  Fingers.  Toes.  Hips.  Knees.  Spine, including neck and lower back.  What are the causes? This condition is caused by age-related wearing down of cartilage that covers the ends of bones. What increases the risk? The following factors may make you more likely to develop this condition:  Older  age.  Being overweight or obese.  Overuse of joints, such as in athletes.  Past injury of a joint.  Past surgery on a joint.  Family history of osteoarthritis.  What are the signs or symptoms? The main symptoms of this condition are pain, swelling, and stiffness in the joint. The joint may lose its shape over time. Small pieces of bone or cartilage may break off and float inside of the joint, which may cause more pain and damage to the joint. Small deposits of bone (osteophytes) may grow on the edges of the joint. Other symptoms may include:  A grating or scraping feeling inside the joint when you move it.  Popping or creaking sounds when you move.  Symptoms may affect one or more joints. Osteoarthritis in a major joint, such as your knee or hip, can make it painful to walk or exercise. If you have osteoarthritis in your hands, you might not be able to grip items, twist your hand, or control small movements of your hands and fingers (fine motor skills). How is this diagnosed? This condition may be diagnosed based on:  Your medical history.  A physical exam.  Your symptoms.  X-rays of the affected joint(s).  Blood tests to rule out other types of arthritis.  How is this treated? There is no cure for this condition, but treatment can help to control pain and improve joint function. Treatment plans may include:  A prescribed exercise program that allows for rest and joint relief. You may work with a physical therapist.  A weight control plan.  Pain relief techniques, such as: ? Applying heat and cold to the joint. ? Electric pulses delivered to nerve endings under the skin (transcutaneous electrical nerve stimulation, or TENS). ? Massage. ? Certain nutritional supplements.  NSAIDs or prescription medicines to help relieve pain.  Medicine to help relieve pain and inflammation (corticosteroids). This can be given by mouth (orally) or as an injection.  Assistive devices,  such as a brace, wrap, splint, specialized glove, or cane.  Surgery, such as: ? An osteotomy. This is done to reposition the bones and relieve pain or to remove loose pieces of bone and cartilage. ? Joint replacement surgery. You may need this surgery if you have very bad (advanced) osteoarthritis.  Follow these instructions at home: Activity  Rest your affected joints as directed by your health care provider.  Do not drive or use heavy machinery while taking prescription pain medicine.  Exercise as directed. Your health care provider or physical therapist may recommend specific types of exercise, such as: ? Strengthening exercises. These are done to strengthen the muscles that support joints that are affected by arthritis. They can be performed with weights or with exercise bands to add resistance. ? Aerobic activities. These are exercises, such as brisk walking or water aerobics, that get your heart pumping. ? Range-of-motion activities. These keep your joints easy to move. ? Balance and agility exercises. Managing pain, stiffness, and swelling  If directed, apply heat to the affected area as often as told by your health care provider. Use the heat source that your health care provider recommends, such as a moist heat pack or a heating pad. ? If you have a removable assistive device, remove it as told by your health care provider. ? Place a towel between your skin and the heat source. If your health care provider tells you to keep the assistive device on while you apply heat, place a towel between the assistive device and the heat source. ? Leave the heat on for 20-30 minutes. ? Remove the heat if your skin turns bright red. This is especially important if you are unable to feel pain, heat, or cold. You may have a greater risk of getting burned.  If directed, put ice on the affected joint: ? If you have a removable assistive device, remove it as told by your health care provider. ? Put  ice in a plastic bag. ? Place a towel between your skin and the bag. If your health care provider tells you to keep the assistive device on during icing, place a towel between the assistive device and the bag. ? Leave the ice on for 20 minutes, 2-3 times a day. General instructions  Take over-the-counter and prescription medicines only as told by your health care provider.  Maintain a healthy weight. Follow instructions from your health care provider for weight control. These may include dietary restrictions.  Do not use any products that contain nicotine or tobacco, such as cigarettes and e-cigarettes. These can delay bone healing. If you need help quitting, ask your health care provider.  Use assistive devices as directed by your health care provider.  Keep all follow-up visits as told by your health care provider. This is important. Where to find more information:  [employer] of Arthritis and Musculoskeletal and Skin Diseases: www.niams.SouthExposed.es  [employer] on Aging: http://kim-miller.com/  American College of Rheumatology:  www.rheumatology.org Contact a health care provider if:  Your skin turns red.  You develop a rash.  You have pain that gets worse.  You have a fever along with joint or muscle aches. Get help right away if:  You lose a lot of weight.  You suddenly lose your appetite.  You have night sweats. Summary  Osteoarthritis is a type of arthritis that affects tissue covering the ends of bones in joints (cartilage).  This condition is caused by age-related wearing down of cartilage that covers the ends of bones.  The main symptom of this condition is pain, swelling, and stiffness in the joint.  There is no cure for this condition, but treatment can help to control pain and improve joint function. This information is not intended to replace advice given to you by your health care provider. Make sure you discuss any questions you have with your health  care provider. Document Released: 11/27/2005 Document Revised: 07/31/2016 Document Reviewed: 07/31/2016   Hip Bursitis Hip bursitis is swelling of a fluid-filled sac (bursa) in your hip. This swelling (inflammation) can be painful. This condition may come and go over time. Follow these instructions at home: Medicines  Take over-the-counter and prescription medicines only as told by your doctor.  Do not drive or use heavy machinery while taking prescription pain medicine, or as told by your doctor.  If you were prescribed an antibiotic medicine, take it as told by your doctor. Do not stop taking the antibiotic even if you start to feel better. Activity  Return to your normal activities as told by your doctor. Ask your doctor what activities are safe for you.  Rest and protect your hip until you feel better. General instructions  Wear wraps that put pressure on your hip (compression wraps) only as told by your doctor.  Raise (elevate) your hip above the level of your heart as much as you can. To do this, try putting a pillow under your hips while you lie down. Stop if this causes pain.  Do not use your hip to support your body weight until your doctor says that you can.  Use crutches as told by your doctor.  Gently rub and stretch your injured area as often as is comfortable.  Keep all follow-up visits as told by your doctor. This is important. How is this prevented?  Exercise regularly, as told by your doctor.  Warm up and stretch before being active.  Cool down and stretch after being active.  Avoid activities that bother your hip or cause pain.  Avoid sitting down for long periods at a time. Contact a doctor if:  You have a fever.  You get new symptoms.  You have trouble walking.  You have trouble doing everyday activities.  You have pain that gets worse.  You have pain that does not get better with medicine.  You get red skin on your hip area.  You get a  feeling of warmth in your hip area. Get help right away if:  You cannot move your hip.  You have very bad pain. This information is not intended to replace advice given to you by your health care provider. Make sure you discuss any questions you have with your health care provider. Document Released: 12/30/2010 Document Revised: 05/04/2016 Document Reviewed: 06/29/2015 Elsevier Interactive Patient Education  2018 [employer].    Chartered certified accountant Patient Education  [employer].

## 2018-02-26 NOTE — Progress Notes (Signed)
  Subjective:    Danielle Villa is a 57 y.o. old female here for hip pain  HPI Hip pain: bilateral. For about a year. No inciting factor.  Denies history of fall, trauma or prior surgery.  Wakes up with the pain in the morning. Doesn't ease over the course of the day. She points at over her trochanteric areas bilaterally. No radiation to her leg.  She describes the pain as aching. Worse with walking and lying on that side. Tramadol helps. Denies fever, excessive night sweat, unintentional weight loss, bowel or bladder issue, saddle anesthesia or history of cancer.  She is currently unemployed.  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  PMH/Problem List: has ONYCHOMYCOSIS, TOENAILS; FIBROIDS, UTERUS; HYPERCHOLESTEROLEMIA; PANIC ATTACKS; MENORRHAGIA; BACK PAIN, LOW; INCONTINENCE, FECAL; POLYURIA; Constipation; Left knee pain; Mood disturbance; Hypertrophic lichen planus; Chronic hand pain, right; Encounter for disability assessment; Abdominal pain, left lower quadrant; Trochanteric bursitis of both hips; and Viral URI on their problem list.   has a past medical history of Back pain.  FH:  Family History  Problem Relation Age of Onset  . Hypertension Mother   . Diabetes Mother   . Hypertension Maternal Grandmother     SH Social History   Tobacco Use  . Smoking status: Never Smoker  . Smokeless tobacco: Never Used  Substance Use Topics  . Alcohol use: Yes    Comment: OCCATIONALLY  . Drug use: No    Review of Systems Review of systems negative except for pertinent positives and negatives in history of present illness above.     Objective:     Vitals:   02/26/18 1052  BP: 118/62  Pulse: 82  Temp: 98.4 F (36.9 C)  TempSrc: Oral  SpO2: 98%  Weight: 138 lb 3.2 oz (62.7 kg)  Height: 5\' 2"  (1.575 m)   Body mass index is 25.28 kg/m.  Physical Exam  GEN: appears well, no apparent distress. CVS: RRR, nl s1 & s2, no murmurs, no edema RESP: no IWOB, good air movement bilaterally,  CTAB GI: BS present & normal, soft, NTND MSK: No bony deformities, inflammation or skin lesion.  She is tender to palpation over sacroiliac joints and greater trochanters bilaterally.  No tenderness over lumbosacral spinous processes. Normal ROM upon flexion & extension, internal & external rotation, abduction, & adduction. Full strength in hip flexors, extensors and abductors. Diminished strength in abductors bilaterally. Straight leg-raising sign negative (sitting and supine).  FABER elicits pain in her hip posteriorly. FADIR is benign.  Otherwise, neuro exam within normal limits in both lower extremities  SKIN: As above NEURO: As above PSYCH: euthymic mood with congruent affect    Assessment and Plan:  1. Chronic hip pain, bilateral: likely a combination of osteoarthritis of her hips and possible trochanteric bursitis bilaterally.  No red flags for fracture, infectious process or malignancy.  Pain is unchanged over the last 1 year.  No history of trauma, fall or surgery.  Discussed about treatment options including steroid injection for possible trochanteric bursitis.  She says she will rather try something taken by mouth first.  Will obtain DG hip bilateral to assess the extent of her osteoarthritis.  We will try naproxen 500 mg twice daily for 5 days followed by as needed for pain.  She denies history of gastritis, hypertension or kidney disease.  Return if symptoms worsen or fail to improve.  Mercy Riding, MD 02/26/18 Pager: [phone]

## 2018-03-12 ENCOUNTER — Other Ambulatory Visit: Payer: Self-pay

## 2018-03-12 NOTE — Telephone Encounter (Signed)
Pt states she is at pharmacy to pick up trazodone and tramadol, we do not have a request for refill. Advised will send request to MD. Advised to check back with pharmacy later before going to pick up rx.  Wallace Cullens, RN

## 2018-03-12 NOTE — Telephone Encounter (Signed)
Pt called wanting to know when her prescription would be filled. Please call when it is sent to pharmacy. Pt has to arrange transportation.

## 2018-03-12 NOTE — Telephone Encounter (Signed)
Please let patient know that we can discuss these refills at her appointment tomorrow. I'd like to talk about her pain plan in person, since these medicines were last filled by her previous PCP.

## 2018-03-12 NOTE — Telephone Encounter (Signed)
LVM on pt phone asking to call office back to inform her of below.  If she calls back please give her the below information. Katharina Caper, Tyger Oka D, [place]

## 2018-03-13 ENCOUNTER — Ambulatory Visit (INDEPENDENT_AMBULATORY_CARE_PROVIDER_SITE_OTHER): Payer: Medicaid Other | Admitting: Family Medicine

## 2018-03-13 ENCOUNTER — Encounter: Payer: Self-pay | Admitting: Family Medicine

## 2018-03-13 ENCOUNTER — Other Ambulatory Visit: Payer: Self-pay

## 2018-03-13 VITALS — BP 120/72 | HR 65 | Temp 98.3°F | Ht 62.0 in | Wt 139.0 lb

## 2018-03-13 DIAGNOSIS — R4586 Emotional lability: Secondary | ICD-10-CM | POA: Diagnosis not present

## 2018-03-13 DIAGNOSIS — G4720 Circadian rhythm sleep disorder, unspecified type: Secondary | ICD-10-CM

## 2018-03-13 DIAGNOSIS — M160 Bilateral primary osteoarthritis of hip: Secondary | ICD-10-CM

## 2018-03-13 MED ORDER — TRAZODONE HCL 50 MG PO TABS: 50.0000 mg | ORAL_TABLET | Freq: Every evening | ORAL | 2 refills | Status: DC | PRN

## 2018-03-13 MED ORDER — MELATONIN 2.5 MG PO CAPS: 1.0000 | ORAL_CAPSULE | Freq: Every day | ORAL | 3 refills | Status: DC

## 2018-03-13 MED ORDER — TRAMADOL HCL 50 MG PO TABS: 50.0000 mg | ORAL_TABLET | Freq: Four times a day (QID) | ORAL | 3 refills | Status: DC | PRN

## 2018-03-13 NOTE — Patient Instructions (Signed)
It was a pleasure to see you today! Thank you for choosing [facility] for your primary care. Danielle Villa was seen for back pain, sleep.   Our plans for today were:  Take the melatonin 1 hour before you want to go to sleep (ex. 3am x 1 week, then 2am the next week, until you are able to go to sleep at a more reasonable time).   Continue trazadone as needed.   Take tylenol first for pain, then try tramadol if the pain isn't improving.   I have placed physical therapy referral for your hip.   For your mood medicines, PLEASE call the Behavioral Medicine center to schedule an appointment. Their phone number is: [phone]. This isn't a referral.   Best,  Dr. Lindell Noe

## 2018-03-13 NOTE — Progress Notes (Signed)
CC: Bilateral hip pain  HPI Hip pain - wants physical therapy. Always take the tramadol first. Typically takes this twice per day.  Was recently given naproxen, and reports that this really helped her hand osteoarthritis as well.  Sleep- patient states that she normally goes to bed around 4 AM.  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  She wakes up around 11 AM, and feels that most of her day is wasted.  She does not have significant difficulty falling asleep, but has been using trazodone nightly for quite some time.  Mood-reports that she has not made a psychiatry appointment because she thought she was supposed to have a referral for this. Would be interested in possibly switching SSRIs in the future  As she recalls our discussion at last viSit about Paxil being on the beers list.   ROS: Denies CP, SOB, abdominal pain, dysuria, changes in BMs.   CC, SH/smoking status, and VS noted  Objective: BP 120/72   Pulse 65   Temp 98.3 F (36.8 C) (Oral)   Ht 5\' 2"  (1.575 m)   Wt 139 lb (63 kg)   SpO2 98%   BMI 25.42 kg/m  Gen: NAD, alert, cooperative.  HEENT: NCAT, EOMI, PERRL CV: RRR, no murmur Resp: CTAB, no wheezes, non-labored Ext: No edema, warm. Neuro: Alert and oriented, Speech clear, No gross deficits  Assessment and plan:  Mood disturbance again, let patient know that she needs to call to schedule psych appointment, behavioral health.  Given phone number again in AVS.    OA (osteoarthritis) of hip Personally reviewed recent x-rays, mild osteoarthritis.  No acute injuries.  This problem has been going on for many years.  Tramadol is helping her.  We will give a refill at this time.  counseled patient that it would be preferable to try Tylenol prior to tramadol, using tramadol as breakthrough.  Additionally counseled patient that naproxen may be appropriate for acute flares, however should not be a daily medicine  given the risk of gastritis.   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  We spent time discussing normal sleep cycles and need to adjust bedtime slowly over time.  Recommended that she try melatonin about 1 hour prior to bedtime, decreasing bedtime towards a more normal nighttime by 1 hour each week. Recheck 3 months. Refilled trazodone for PRN use.    Orders Placed This Encounter  Procedures  . Ambulatory referral to Physical Therapy    Referral Priority:   Routine    Referral Type:   Physical Medicine    Referral Reason:   Specialty Services Required    Requested Specialty:   Physical Therapy    Number of Visits Requested:   1    Meds ordered this encounter  Medications  . traZODone (DESYREL) 50 MG tablet    Sig: Take 1 tablet (50 mg total) by mouth at bedtime as needed. for sleep    Dispense:  30 tablet    Refill:  2    Please consider 90 day supplies to promote better adherence  . DISCONTD: traMADol (ULTRAM) 50 MG tablet    Sig: Take 1 tablet (50 mg total) by mouth every 6 (six) hours as needed.    Dispense:  30 tablet    Refill:  3  . Melatonin 2.5 MG CAPS    Sig: Take 1 capsule (2.5  mg total) by mouth at bedtime.    Dispense:  90 capsule    Refill:  3  . traMADol (ULTRAM) 50 MG tablet    Sig: Take 1 tablet (50 mg total) by mouth every 6 (six) hours as needed.    Dispense:  30 tablet    Refill:  3     Ralene Ok, MD, PGY2 03/15/2018 9:53 AM

## 2018-03-15 DIAGNOSIS — M169 Osteoarthritis of hip, unspecified: Secondary | ICD-10-CM | POA: Insufficient documentation

## 2018-03-15 DIAGNOSIS — G4720 Circadian rhythm sleep disorder, unspecified type: Secondary | ICD-10-CM | POA: Insufficient documentation

## 2018-03-15 NOTE — Assessment & Plan Note (Signed)
Personally reviewed recent x-rays, mild osteoarthritis.  No acute injuries.  This problem has been going on for many years.  Tramadol is helping her.  We will give a refill at this time.  counseled patient that it would be preferable to try Tylenol prior to tramadol, using tramadol as breakthrough.  Additionally counseled patient that naproxen may be appropriate for acute flares, however should not be a daily medicine given the risk of gastritis.

## 2018-03-15 NOTE — Assessment & Plan Note (Signed)
again, let patient know that she needs to call to schedule psych appointment, behavioral health.  Given phone number again in AVS.

## 2018-03-15 NOTE — Assessment & Plan Note (Signed)
Has been ongoing for many years, has been using trazodone to treat this.  We spent time discussing normal sleep cycles and need to adjust bedtime slowly over time.  Recommended that she try melatonin about 1 hour prior to bedtime, decreasing bedtime towards a more normal nighttime by 1 hour each week. Recheck 3 months. Refilled trazodone for PRN use.

## 2018-03-19 ENCOUNTER — Other Ambulatory Visit: Payer: Self-pay

## 2018-03-19 ENCOUNTER — Ambulatory Visit: Payer: Medicaid Other | Attending: Family Medicine

## 2018-03-19 DIAGNOSIS — R293 Abnormal posture: Secondary | ICD-10-CM | POA: Insufficient documentation

## 2018-03-19 DIAGNOSIS — M6281 Muscle weakness (generalized): Secondary | ICD-10-CM | POA: Insufficient documentation

## 2018-03-19 DIAGNOSIS — M25552 Pain in left hip: Secondary | ICD-10-CM | POA: Insufficient documentation

## 2018-03-19 DIAGNOSIS — M25551 Pain in right hip: Secondary | ICD-10-CM | POA: Insufficient documentation

## 2018-03-19 NOTE — Therapy (Signed)
[place], [place], [address] Phone: [phone]   Fax:  [phone]  Physical Therapy Evaluation  Patient Details  Name: Danielle Villa MRN: [id number] Date of Birth: [birth year] Referring Provider: Jake Seats, MD   Encounter Date: 03/19/2018  PT End of Session - 03/19/18 1336    Visit Number  1    Number of Visits  12    Date for PT Re-Evaluation  05/10/18    Authorization Type  MCD    PT Start Time  0135    PT Stop Time  0215    PT Time Calculation (min)  40 min    Activity Tolerance  Patient tolerated treatment well    Behavior During Therapy  [facility] for tasks assessed/performed       Past Medical History:  Diagnosis Date  . Back pain     History reviewed. No pertinent surgical history.  There were no vitals filed for this visit.   Subjective Assessment - 03/19/18 1342    Subjective  She reports hip pain bilat most in AM. Diff walking as stiff /pain in AM. Pain meds allows walking during day    Limitations  Walking occasional pain wake with turning in bed.     How long can you walk comfortably?  30 min     Diagnostic tests  Xrays : Mild OA per MD note    Currently in Pain?  Yes    Pain Score  6     Pain Location  Hip    Pain Orientation  Right;Left;Lateral    Pain Descriptors / Indicators  Aching    Pain Type  Chronic pain    Pain Onset  More than a month ago    Pain Frequency  Constant    Aggravating Factors   activity on feet.      Pain Relieving Factors  meds , heat         [facility] PT Assessment - 03/19/18 0001      Assessment   Medical Diagnosis  bilateral hip pain    Referring Provider  Danielle Seats, MD    Onset Date/Surgical Date  -- 2 years      Balance Screen   Has the patient fallen in the past 6 months  No      Prior Function   Level of Independence  Independent with basic ADLs;Independent with homemaking with ambulation      Cognition   Overall  Cognitive Status  Within Functional Limits for tasks assessed      Posture/Postural Control   Posture Comments  sway back      ROM / Strength   AROM / PROM / Strength  AROM;PROM;Strength      AROM   AROM Assessment Site  Lumbar    Lumbar Flexion  able to touch ankles     Lumbar Extension  20    Lumbar - Right Side Bend  15    Lumbar - Left Side Bend  15      PROM   Overall PROM Comments  Hip ROM normal except   RT hip ER/ LT 50 degrees and pain with OBERS testing.           Strength   Overall Strength Comments  Normal LE strength except bilateral hip ext 3-/5 , hip abduction   4/5       Flexibility   Soft Tissue Assessment /Muscle Length  yes    Hamstrings  SLR  95 degrees       Ambulation/Gait   Gait Comments  Normal                 Objective measurements completed on examination: See above findings.              PT Education - 03/19/18 1520    Education provided  Yes    Education Details  POC HEP    Person(s) Educated  Patient    Methods  Explanation;Tactile cues;Verbal cues;Handout    Comprehension  Verbalized understanding;Returned demonstration       PT Short Term Goals - 03/19/18 1527      PT SHORT TERM GOAL #1   Title  She will be independent with initial HEP    Baseline  No program    Time  2    Period  Weeks    Status  New      PT SHORT TERM GOAL #2   Title  She will report pain decr 10-15% or more in each hip    Baseline  varies but constant daily pain  6/10 today    Time  2    Period  Weeks    Status  New        PT Long Term Goals - 03/19/18 1528      PT LONG TERM GOAL #1   Title  She will be independent with all HEP issued    Baseline  independent with initial HEp    Time  6    Period  Weeks    Status  New      PT LONG TERM GOAL #2   Title  She will report pain decr in each hip 50% or more     Baseline  15% improved    Time  6    Period  Weeks    Status  New      PT LONG TERM GOAL #3   Title  She will report  pain as intermittant    Baseline  pain constant at eval    Time  6    Period  Weeks    Status  New      PT LONG TERM GOAL #4   Title  She will report able to walk 45-60 min  before pain incr    Baseline  30 min at eval    Time  6    Period  Weeks    Status  New      PT LONG TERM GOAL #5   Title  She will report 50% decr pain and stiffness in AM on waking    Baseline  AM pain and stiffness     Time  6    Period  Weeks    Status  New             Plan - 03/19/18 1520    Clinical Impression Statement  Danielle Villa reports high moderate bilaterla hip pain but walked normal and was able to lye on each side without complaint. She has hip glut weakness and some pain with Obers testing bilaterally and some ER tightness but this is equal.  She is tender in gluteals biilaterally.. We should be able to ease pain but may be somewhat limited as she appears to have exaggerated pain self assessment    Clinical Presentation  Stable    Clinical Decision Making  Low    Rehab Potential  Good    PT Frequency  --  3 visits over  authorized period    PT Duration  -- 2 weeks then 2x/week for 4 weeks    PT Treatment/Interventions  Iontophoresis 4mg /ml Dexamethasone;Moist Heat;Ultrasound;Therapeutic exercise;Patient/family education;Manual techniques;Passive range of motion;Taping    PT Next Visit Plan  Manual , modalites , review HEP and add as able     PT Home Exercise Plan  knee to chest to same and opposite shoulder, ITB stretch , bridge and side hip abduciton    Consulted and Agree with Plan of Care  Patient       Patient will benefit from skilled therapeutic intervention in order to improve the following deficits and impairments:  Pain, Postural dysfunction, Decreased strength, Increased muscle spasms  Visit Diagnosis: Pain in right hip  Pain in left hip     Problem List Patient Active Problem List   Diagnosis Date Noted  . OA (osteoarthritis) of hip 03/15/2018  . Sleep-wake cycle  disorder 03/15/2018  . Trochanteric bursitis of both hips 12/19/2017  . Abdominal pain, left lower quadrant 11/28/2017  . Encounter for disability assessment 11/13/2017  . Chronic hand pain, right 10/16/2017  . Hypertrophic lichen planus 62/83/1517  . Mood disturbance 09/07/2017  . Left knee pain 07/04/2017  . Constipation 05/17/2017  . POLYURIA 12/29/2009  . ONYCHOMYCOSIS, TOENAILS 02/01/2009  . FIBROIDS, UTERUS 04/29/2008  . MENORRHAGIA 03/06/2008  . HYPERCHOLESTEROLEMIA 02/07/2007  . PANIC ATTACKS 02/07/2007  . BACK PAIN, LOW 02/07/2007  . INCONTINENCE, FECAL 02/07/2007    Darrel Hoover  PT 03/19/2018, 3:36 PM  [facility] [address] [place], [place], [address] Phone: [phone]   Fax:  [phone]  Name: Danielle Villa MRN: [id number] Date of Birth: August 23, 1961

## 2018-03-19 NOTE — Patient Instructions (Signed)
Issued knee to shoulder same and opp 23- reps 30-90 sec 2x/day , bridge and side hip abduct 10-15 reps 2x/day RT /LT

## 2018-04-01 ENCOUNTER — Ambulatory Visit: Payer: Medicaid Other

## 2018-04-05 ENCOUNTER — Ambulatory Visit: Payer: Medicaid Other

## 2018-04-05 DIAGNOSIS — M25552 Pain in left hip: Secondary | ICD-10-CM

## 2018-04-05 DIAGNOSIS — M25551 Pain in right hip: Secondary | ICD-10-CM

## 2018-04-05 NOTE — Therapy (Signed)
[name], [place], [address] Phone: [phone]   Fax:  [phone]  Physical Therapy Treatment  Patient Details  Name: Danielle Villa MRN: [id number] Date of Birth: September 28, 1961 Referring Provider: Jake Seats, MD   Encounter Date: 04/05/2018  PT End of Session - 04/05/18 1055    Visit Number  2    Number of Visits  12    Date for PT Re-Evaluation  05/03/18    Authorization Type  MCD    Authorization Time Period  4/19 to 04/27/18    Authorization - Visit Number  1    Authorization - Number of Visits  3    PT Start Time  1048    PT Stop Time  1140    PT Time Calculation (min)  52 min    Activity Tolerance  Patient tolerated treatment well;Patient limited by pain    Behavior During Therapy  [facility] for tasks assessed/performed       Past Medical History:  Diagnosis Date  . Back pain     No past surgical history on file.  There were no vitals filed for this visit.  Subjective Assessment - 04/05/18 1059    Subjective  6/10 pain both hips    Currently in Pain?  Yes    Pain Score  6     Pain Location  Hip    Pain Orientation  Right;Left    Pain Descriptors / Indicators  Aching    Pain Type  Chronic pain    Pain Onset  More than a month ago    Pain Frequency  Constant    Aggravating Factors   activity    Pain Relieving Factors  meds , heat                       [facility] Adult PT Treatment/Exercise - 04/05/18 0001      Exercises   Exercises  Knee/Hip      Knee/Hip Exercises: Stretches   Other Knee/Hip Stretches  Knee to chest  single RT/ Lt and to opposite shoulder  60 sec each leg      Knee/Hip Exercises: Aerobic   Nustep  LE L4 5 min      Knee/Hip Exercises: Supine   Bridges  Both;10 reps      Knee/Hip Exercises: Sidelying   Hip ABduction  Right;Left;10 reps      Modalities   Modalities  Moist Heat      Moist Heat Therapy   Number Minutes Moist Heat  10  Minutes    Moist Heat Location  Hip RT and LT       Manual Therapy   Manual Therapy  Manual Traction;Joint mobilization;Soft tissue mobilization    Joint Mobilization  Ap glides 2-3 gr x 30 reps each hip     Soft tissue mobilization  gluteal posterior and anterior RT and LT  and instruvcted pt on how tro use tennis balls at home for STW    Manual Traction  100 reps RT/ Lt  leg             PT Education - 04/05/18 1130    Education provided  Yes    Education Details  STW with tennis balls.     Person(s) Educated  Patient    Methods  Explanation;Demonstration    Comprehension  Verbalized understanding       PT Short Term Goals - 04/05/18 1133  PT SHORT TERM GOAL #1   Title  She will be independent with initial HEP    Baseline  needed cues     Status  On-going      PT SHORT TERM GOAL #2   Title  She will report pain decr 10-15% or more in each hip    Baseline  no change    Status  On-going        PT Long Term Goals - 03/19/18 1528      PT LONG TERM GOAL #1   Title  She will be independent with all HEP issued    Baseline  independent with initial HEp    Time  6    Period  Weeks    Status  New      PT LONG TERM GOAL #2   Title  She will report pain decr in each hip 50% or more     Baseline  15% improved    Time  6    Period  Weeks    Status  New      PT LONG TERM GOAL #3   Title  She will report pain as intermittant    Baseline  pain constant at eval    Time  6    Period  Weeks    Status  New      PT LONG TERM GOAL #4   Title  She will report able to walk 45-60 min  before pain incr    Baseline  30 min at eval    Time  6    Period  Weeks    Status  New      PT LONG TERM GOAL #5   Title  She will report 50% decr pain and stiffness in AM on waking    Baseline  AM pain and stiffness     Time  6    Period  Weeks    Status  New            Plan - 04/05/18 1100    Clinical Impression Statement  Continues to report high pain levels without  visible indicaiton of high pain level, reported pain with mobs and stretch but reported felt better with pressure with ball /STW.     PT Treatment/Interventions  Iontophoresis 4mg /ml Dexamethasone;Moist Heat;Ultrasound;Therapeutic exercise;Patient/family education;Manual techniques;Passive range of motion;Taping    PT Next Visit Plan  Manual , modalites ,  HEP add as able resisted hip exer    PT Home Exercise Plan  knee to chest to same and opposite shoulder, ITB stretch , bridge and side hip abduciton, STW with tennis ball    Consulted and Agree with Plan of Care  Patient       Patient will benefit from skilled therapeutic intervention in order to improve the following deficits and impairments:  Pain, Postural dysfunction, Decreased strength, Increased muscle spasms  Visit Diagnosis: Pain in right hip  Pain in left hip     Problem List Patient Active Problem List   Diagnosis Date Noted  . OA (osteoarthritis) of hip 03/15/2018  . Sleep-wake cycle disorder 03/15/2018  . Trochanteric bursitis of both hips 12/19/2017  . Abdominal pain, left lower quadrant 11/28/2017  . Encounter for disability assessment 11/13/2017  . Chronic hand pain, right 10/16/2017  . Hypertrophic lichen planus 17/51/0258  . Mood disturbance 09/07/2017  . Left knee pain 07/04/2017  . Constipation 05/17/2017  . POLYURIA 12/29/2009  . ONYCHOMYCOSIS, TOENAILS 02/01/2009  . FIBROIDS, UTERUS 04/29/2008  .  MENORRHAGIA 03/06/2008  . HYPERCHOLESTEROLEMIA 02/07/2007  . PANIC ATTACKS 02/07/2007  . BACK PAIN, LOW 02/07/2007  . INCONTINENCE, FECAL 02/07/2007    Danielle Villa  PT 04/05/2018, 11:34 AM  [facility] [address] [place], [place], [address] Phone: [phone]   Fax:  [phone]  Name: Danielle Villa MRN: [id number] Date of Birth: [birth year]

## 2018-04-10 ENCOUNTER — Ambulatory Visit: Payer: Medicaid Other

## 2018-04-17 ENCOUNTER — Telehealth: Payer: Self-pay | Admitting: Family Medicine

## 2018-04-17 NOTE — Telephone Encounter (Signed)
Pt called and would like Dr Lindell Noe to give her a call. She said the last time she was seen 03/13/18 she was told to set up another appointment but can not remember what type of appointment she needs to make.

## 2018-04-18 NOTE — Telephone Encounter (Signed)
Spoke to pt. She just needed the phone number Dr. Lindell Noe put on her AVS for [facility] health. Ottis Stain, CMA

## 2018-04-18 NOTE — Telephone Encounter (Signed)
White team, you are welcome to call her and help her set up an appt. She needs to follow up in 1-2 months to discuss her sleep problems.

## 2018-04-24 ENCOUNTER — Ambulatory Visit: Payer: Medicaid Other | Attending: Family Medicine

## 2018-04-24 DIAGNOSIS — M25552 Pain in left hip: Secondary | ICD-10-CM | POA: Diagnosis present

## 2018-04-24 DIAGNOSIS — M25551 Pain in right hip: Secondary | ICD-10-CM | POA: Diagnosis present

## 2018-04-24 DIAGNOSIS — M6281 Muscle weakness (generalized): Secondary | ICD-10-CM

## 2018-04-24 DIAGNOSIS — R293 Abnormal posture: Secondary | ICD-10-CM | POA: Insufficient documentation

## 2018-04-24 NOTE — Therapy (Addendum)
[place], [place], [address] Phone: [phone]   Fax:  [phone]  Physical Therapy Treatment  Patient Details  Name: Danielle Villa MRN: [id number] Date of Birth: 01/15/1961 Referring Provider: Jake Seats, MD   Encounter Date: 04/24/2018  PT End of Session - 04/24/18 1528    Visit Number  3    Number of Visits  8    Date for PT Re-Evaluation  05/24/18    Authorization Type  MCD    Authorization Time Period  to 04/27/18    Authorization - Visit Number  2    Authorization - Number of Visits  3    PT Start Time  0300    PT Stop Time  0350    PT Time Calculation (min)  50 min    Activity Tolerance  Patient tolerated treatment well;Patient limited by pain    Behavior During Therapy  [facility] for tasks assessed/performed       Past Medical History:  Diagnosis Date  . Back pain     History reviewed. No pertinent surgical history.  There were no vitals filed for this visit.  Subjective Assessment - 04/24/18 1509    Subjective  No changes    Currently in Pain?  Yes    Pain Score  6     Pain Location  Hip    Pain Orientation  Right;Left    Pain Descriptors / Indicators  Aching    Pain Type  Chronic pain    Pain Onset  More than a month ago    Pain Frequency  Constant    Aggravating Factors   activity    Pain Relieving Factors  meds , heat         [facility] PT Assessment - 04/24/18 0001      Strength   Overall Strength Comments  Normal LE strength except bilateral hip ext 3-/5 , hip abduction   4/5                    [facility] Adult PT Treatment/Exercise - 04/24/18 0001      Knee/Hip Exercises: Stretches   Active Hamstring Stretch  Right;Left;2 reps;30 seconds    Other Knee/Hip Stretches  Knee to chest  single RT/ Lt   30 sec each leg 2sets    Other Knee/Hip Stretches  Fig 4  30 sec x 1 RT/LT         Knee/Hip Exercises: Aerobic   Nustep  LE L4 5 min      Knee/Hip  Exercises: Supine   Bridges  Both;10 reps;2 sets one with legs on ball    Other Supine Knee/Hip Exercises  lifting ball with feet then arms done very well      Knee/Hip Exercises: Sidelying   Hip ABduction  Right;Left;10 reps cued for technique      Knee/Hip Exercises: Prone   Other Prone Exercises  quad stretch x 30 sec RT/LT       Moist Heat Therapy   Number Minutes Moist Heat  10 Minutes    Moist Heat Location  Hip RT/LT       Manual Therapy   Soft tissue mobilization  gluteal posterior and anterior RT and LT  and instruvcted pt on how tro use tennis balls at home for STW    Manual Traction  100 reps RT/ Lt  leg               PT Short  Term Goals - 04/24/18 1511      PT SHORT TERM GOAL #1   Title  She will be independent with initial HEP    Baseline  incr frequency to daily    Status  Achieved      PT SHORT TERM GOAL #2   Title  She will report pain decr 10-15% or more in each hip    Baseline  no change    Status  On-going        PT Long Term Goals - 04/24/18 1511      PT LONG TERM GOAL #1   Title  She will be independent with all HEP issued    Status  On-going      PT LONG TERM GOAL #2   Title  She will report pain decr in each hip 50% or more     Baseline  no change    Status  On-going      PT LONG TERM GOAL #3   Title  She will report pain as intermittant    Baseline  pain constant     Status  On-going      PT LONG TERM GOAL #4   Title  She will report able to walk 45-60 min  before pain incr    Baseline  30 min     Status  On-going      PT LONG TERM GOAL #5   Title  She will report 50% decr pain and stiffness in AM on waking    Baseline  no change    Status  On-going            Plan - 04/24/18 1528    Clinical Impression Statement  No change in pain, ROM normal . Strength WNL.  Mobility normal.  She canceled one appointment and has not been seen since 04/05/18. so continuity has not been good  She may benefit from trial of dry  needling.    I don't do that treatment so will have to have another PT do that.     PT Frequency  1x / week    PT Duration  4 weeks    PT Treatment/Interventions  Iontophoresis 4mg /ml Dexamethasone;Moist Heat;Ultrasound;Therapeutic exercise;Patient/family education;Manual techniques;Passive range of motion;Taping    PT Next Visit Plan  Manual , modalites ,  HEP add as able resisted hip exer bands,  DN    PT Home Exercise Plan  knee to chest to same and opposite shoulder, ITB stretch , bridge and side hip abduciton, STW with tennis ball    Consulted and Agree with Plan of Care  Patient       Patient will benefit from skilled therapeutic intervention in order to improve the following deficits and impairments:  Pain, Postural dysfunction, Decreased strength, Increased muscle spasms  Visit Diagnosis: Pain in right hip - Plan: PT plan of care cert/re-cert  Pain in left hip - Plan: PT plan of care cert/re-cert  Abnormal posture - Plan: PT plan of care cert/re-cert  Muscle weakness (generalized) - Plan: PT plan of care cert/re-cert     Problem List Patient Active Problem List   Diagnosis Date Noted  . OA (osteoarthritis) of hip 03/15/2018  . Sleep-wake cycle disorder 03/15/2018  . Trochanteric bursitis of both hips 12/19/2017  . Abdominal pain, left lower quadrant 11/28/2017  . Encounter for disability assessment 11/13/2017  . Chronic hand pain, right 10/16/2017  . Hypertrophic lichen planus 25/85/2778  . Mood disturbance 09/07/2017  . Left knee  pain 07/04/2017  . Constipation 05/17/2017  . POLYURIA 12/29/2009  . ONYCHOMYCOSIS, TOENAILS 02/01/2009  . FIBROIDS, UTERUS 04/29/2008  . MENORRHAGIA 03/06/2008  . HYPERCHOLESTEROLEMIA 02/07/2007  . PANIC ATTACKS 02/07/2007  . BACK PAIN, LOW 02/07/2007  . INCONTINENCE, FECAL 02/07/2007    Darrel Hoover  PT 04/24/2018, 4:02 PM  [facility] [address] [place], [place],  [address] Phone: [phone]   Fax:  [phone]  Name: Danielle Villa MRN: [id number] Date of Birth: November 25, 1961

## 2018-04-29 ENCOUNTER — Telehealth: Payer: Self-pay

## 2018-04-29 NOTE — Telephone Encounter (Signed)
Received fax from [place] requesting prior authorization of Tramadol. Form placed in MD's box for completion along with Medicaid formulary.  Danley Danker, RN [facility] RN)

## 2018-05-01 ENCOUNTER — Telehealth: Payer: Self-pay | Admitting: Family Medicine

## 2018-05-01 DIAGNOSIS — R4586 Emotional lability: Secondary | ICD-10-CM

## 2018-05-01 NOTE — Telephone Encounter (Signed)
Filled out prior auth and put in RN box. Please print clinic note from 03/13/18 to attach as the form requests, I can't print from my computer.

## 2018-05-01 NOTE — Telephone Encounter (Signed)
Pt called because she was told by Dr Lindell Noe of a place to call and set up an appt for her mood swings. Pt called that number and they told her she has to have a referral. Please let pt know when the referral has been placed.

## 2018-05-02 NOTE — Telephone Encounter (Signed)
I was told Danielle Villa didn't need referrals, but happy to place one just in case. Please let her know I placed it today if she wants to try to call them back.

## 2018-05-02 NOTE — Telephone Encounter (Signed)
Completed PA info in [place] for Tramadol.  Status pending.  Will recheck status in 24 hours. Wallace Cullens, RN

## 2018-05-02 NOTE — Telephone Encounter (Signed)
Pt informed of below. Zimmerman Rumple, April D, CMA  

## 2018-05-08 NOTE — Telephone Encounter (Signed)
Prior approval for Tramadol completed via  [employer].  Med approved for 05/02/18 - 10/29/18  Prior approval # [id number] W .   [place] informed.  Wallace Cullens, RN

## 2018-05-13 ENCOUNTER — Ambulatory Visit: Payer: Medicaid Other

## 2018-05-16 NOTE — Progress Notes (Signed)
   Subjective:   Patient ID: Danielle Villa    DOB: [birth year], 57 y.o. female   MRN: [id number]  Danielle Villa is a 57 y.o. female with a history of OA, HLD, panic attacks here for   Rash Patient endorses persistent pruritic rash on bilateral forearms and lower back that has been present for about a year.  Was initially seen in October 2018 where it was thought she had lichen planus and received betamethasone ointment with some improvement.  She also endorses small pruritic circular rash in left groin and small firm spot on L wrist that have not improved with betamethasone ointment or cocoa butter. She was previously on trazodone and loratadine which she subsequently stopped once she read side effects of rash and itching on the Internet, however did not have improvement after stopping these medications.  She lives alone, denies any rashes with usual contacts.  Denies pet or tick/insect exposure or new medications.  No new soaps or detergents.  No recent travel.  Denies pain, fever, worsening of joint pain or swelling.  Review of Systems:  Per HPI.   [place], medications and smoking status reviewed.  Objective:   BP 124/68   Pulse 72   Temp 98.3 F (36.8 C) (Oral)   Ht 5\' 2"  (1.575 m)   Wt 137 lb (62.1 kg)   SpO2 99%   BMI 25.06 kg/m  Vitals and nursing note reviewed.  General: well nourished, well developed, in no acute distress with non-toxic appearance HEENT: normocephalic, atraumatic, moist mucous membranes CV: regular rate and rhythm without murmurs, rubs, or gallops Lungs: clear to auscultation bilaterally with normal work of breathing Abdomen: soft, non-tender, non-distended, no masses or organomegaly palpable, normoactive bowel sounds Skin: warm, dry. Circular hyperpigmented well healed lesions to bilateral forearms and sacral region of back. ~2cm symmetric, circular lesion with erythematous scaly borders with hyperpigmented smooth center in L groin. ~1-79mm  hyperkeratotic skin colored lesion with overlying scab present on L wrist. Extremities: warm and well perfused, normal tone MSK: ROM grossly intact, strength intact, gait normal Neuro: Alert and oriented, speech normal  Assessment & Plan:   Hypertrophic lichen planus Noted improvement with bethamethasone ointment to bilateral forearms and back.  Refill given today. States 1-39mm lesion present on L wrist appears similar to previous lesions on forearms but has not improved with betamethasone. Will have patient make appointment with derm clinic for further evaluation and possible biopsy.  Ringworm ~2cm symmetric, circular lesion with erythematous scaly borders with hyperpigmented smooth center in L groin most consistent with ringworm. Will provide clotrimazole ointment with instructions to follow up in one week if no better.  No orders of the defined types were placed in this encounter.  Meds ordered this encounter  Medications  . Clotrimazole 1 % OINT    Sig: Apply 1 application topically 2 (two) times daily.    Dispense:  1 Tube    Refill:  0  . betamethasone valerate ointment (VALISONE) 0.1 %    Sig: Apply 1 application topically 2 (two) times daily.    Dispense:  30 g    Refill:  0   Precepted with Dr. Mingo Amber.  Rory Percy, DO PGY-1, [facility] 05/17/2018 1:42 PM

## 2018-05-17 ENCOUNTER — Other Ambulatory Visit: Payer: Self-pay

## 2018-05-17 ENCOUNTER — Ambulatory Visit (INDEPENDENT_AMBULATORY_CARE_PROVIDER_SITE_OTHER): Payer: Medicaid Other | Admitting: Family Medicine

## 2018-05-17 ENCOUNTER — Encounter: Payer: Self-pay | Admitting: Family Medicine

## 2018-05-17 VITALS — BP 124/68 | HR 72 | Temp 98.3°F | Ht 62.0 in | Wt 137.0 lb

## 2018-05-17 DIAGNOSIS — L430 Hypertrophic lichen planus: Secondary | ICD-10-CM

## 2018-05-17 DIAGNOSIS — B359 Dermatophytosis, unspecified: Secondary | ICD-10-CM | POA: Diagnosis not present

## 2018-05-17 MED ORDER — CLOTRIMAZOLE 1 % EX OINT: 1.0000 "application " | TOPICAL_OINTMENT | Freq: Two times a day (BID) | CUTANEOUS | 0 refills | Status: DC

## 2018-05-17 MED ORDER — BETAMETHASONE VALERATE 0.1 % EX OINT: 1.0000 "application " | TOPICAL_OINTMENT | Freq: Two times a day (BID) | CUTANEOUS | 0 refills | Status: DC

## 2018-05-17 NOTE — Patient Instructions (Signed)
It was great to see you!  The rash in your groin is most consistent with ringworm.  I am prescribing an antifungal cream to help.  I am glad that the rash on your arms and back are improving, I will refill your steroid cream.  I would like for you to be seen in the dermatology clinic to further evaluate or biopsy the persistent spot on your arm.  Take care and seek immediate care sooner if you develop any concerns.   Dr. [facility]

## 2018-05-17 NOTE — Assessment & Plan Note (Signed)
~  2cm symmetric, circular lesion with erythematous scaly borders with hyperpigmented smooth center in L groin most consistent with ringworm. Will provide clotrimazole ointment with instructions to follow up in one week if no better.

## 2018-05-17 NOTE — Assessment & Plan Note (Addendum)
Noted improvement with bethamethasone ointment to bilateral forearms and back.  Refill given today. States 1-33mm lesion present on L wrist appears similar to previous lesions on forearms but has not improved with betamethasone. Will have patient make appointment with derm clinic for further evaluation and possible biopsy.

## 2018-05-20 ENCOUNTER — Ambulatory Visit: Payer: Medicaid Other | Attending: Family Medicine

## 2018-05-20 DIAGNOSIS — M25551 Pain in right hip: Secondary | ICD-10-CM | POA: Insufficient documentation

## 2018-05-20 DIAGNOSIS — M25552 Pain in left hip: Secondary | ICD-10-CM | POA: Insufficient documentation

## 2018-05-20 DIAGNOSIS — R293 Abnormal posture: Secondary | ICD-10-CM

## 2018-05-20 DIAGNOSIS — M6281 Muscle weakness (generalized): Secondary | ICD-10-CM

## 2018-05-20 NOTE — Therapy (Signed)
[place], [place], [address] Phone: [phone]   Fax:  [phone]  Patient Details  Name: Danielle Villa MRN: [id number] Date of Birth: 1961-11-06 Referring Provider:  Sela Hilding, MD  Encounter Date: 05/20/2018       Scheduling mishap as she was to be on the schedule of PT that does DN. [facility] will work on getting her in for this.   If not able will ask for  New extension.   Darrel Hoover  PT 05/20/2018, 3:56 PM  [facility] [address] [place], [place], [address] Phone: [phone]   Fax:  [phone]

## 2018-05-22 ENCOUNTER — Ambulatory Visit: Payer: Medicaid Other | Admitting: Physical Therapy

## 2018-05-24 ENCOUNTER — Encounter

## 2018-05-27 ENCOUNTER — Ambulatory Visit: Payer: Medicaid Other

## 2018-05-29 ENCOUNTER — Encounter: Payer: Self-pay | Admitting: Physical Therapy

## 2018-05-29 ENCOUNTER — Ambulatory Visit: Payer: Medicaid Other | Admitting: Physical Therapy

## 2018-05-29 DIAGNOSIS — M6281 Muscle weakness (generalized): Secondary | ICD-10-CM | POA: Diagnosis present

## 2018-05-29 DIAGNOSIS — M25551 Pain in right hip: Secondary | ICD-10-CM | POA: Diagnosis not present

## 2018-05-29 DIAGNOSIS — R293 Abnormal posture: Secondary | ICD-10-CM

## 2018-05-29 DIAGNOSIS — M25552 Pain in left hip: Secondary | ICD-10-CM

## 2018-05-29 NOTE — Therapy (Addendum)
[place], [place], [address] Phone: [phone]   Fax:  [phone]  Physical Therapy Treatment / Re-certification  Patient Details  Name: Danielle Villa MRN: [id number] Date of Birth: [birth year] Referring Provider: Jake Seats, MD   Encounter Date: 05/29/2018  PT End of Session - 05/29/18 0839    Visit Number  4    Number of Visits  12    Date for PT Re-Evaluation  06/26/18    Authorization Type  MCD (reassess at 12th visit)    PT Start Time  0802    PT Stop Time  0852    PT Time Calculation (min)  50 min    Activity Tolerance  Patient tolerated treatment well    Behavior During Therapy  [facility] for tasks assessed/performed       Past Medical History:  Diagnosis Date  . Back pain     History reviewed. No pertinent surgical history.  There were no vitals filed for this visit.  Subjective Assessment - 05/29/18 0804    Subjective  "No specific changes in the hip. I do my exercises about 4 x a throughout the week. I feel like some days the exercises help and some days it feels like it may make it alittle worse it depends"     Patient Stated Goals  improve standing/ walking endurance, decreasing pain,     Currently in Pain?  Yes    Pain Score  7     Pain Location  Hip    Pain Orientation  Right;Lateral    Pain Descriptors / Indicators  Sharp    Pain Onset  More than a month ago    Aggravating Factors   Standing for to long, and getting up in the morning.     Pain Relieving Factors  medication, heat pack         [facility] PT Assessment - 05/29/18 0808      ROM / Strength   AROM / PROM / Strength  Strength;AROM      AROM   Lumbar Flexion  80 end range tightness on the L    Lumbar Extension  15 End range pain R/L    Lumbar - Right Side Bend  20 end range soreness    Lumbar - Left Side Bend  20 end range soreness      Strength   Strength Assessment Site  Hip    Right/Left Hip   Right;Left    Right Hip Extension  3-/5    Right Hip ABduction  3+/5    Left Hip Extension  3-/5    Left Hip ABduction  3+/5                   [facility] Adult PT Treatment/Exercise - 05/29/18 7858      Knee/Hip Exercises: Stretches   Other Knee/Hip Stretches  glute med stretch 2 x 30 sec      Knee/Hip Exercises: Supine   Bridges  2 sets;10 reps      Knee/Hip Exercises: Sidelying   Hip ABduction  -- attempted to perform SLR but pt was unable to so halted    Other Sidelying Knee/Hip Exercises  reverse clam shell 2 x 10     Other Sidelying Knee/Hip Exercises  bent knee clamshell 3 x 10       Moist Heat Therapy   Number Minutes Moist Heat  10 Minutes    Moist Heat Location  Hip in L  sidelying      Manual Therapy   Manual therapy comments  skilled palpation and monitoring of pt throughout TPDN    Soft tissue mobilization  IASTM along the glute med       Trigger Point Dry Needling - 05/29/18 0826    Consent Given?  Yes    Education Handout Provided  Yes given verbally    Muscles Treated Lower Body  Gluteus minimus    Gluteus Minimus Response  Twitch response elicited;Palpable increased muscle length R glute med           PT Education - 05/29/18 0854    Education provided  Yes    Education Details  muscle anatomy, referral patterns. What TPDN is, benefits of treatment, what to expect and after care.     Person(s) Educated  Patient    Methods  Explanation;Verbal cues;Handout    Comprehension  Verbalized understanding;Verbal cues required       PT Short Term Goals - 05/29/18 0836      PT SHORT TERM GOAL #1   Title  She will be independent with initial HEP    Time  2    Period  Weeks    Status  Achieved      PT SHORT TERM GOAL #2   Title  She will report pain decr 10-15% or more in each hip    Baseline  minimal change with exercise    Time  2    Period  Weeks    Status  On-going        PT Long Term Goals - 05/29/18 2130      PT LONG TERM GOAL #1    Title  She will be independent with all HEP issued    Baseline  Independent with current HEP and progressing as tolerated.     Time  4    Period  Weeks    Status  On-going    Target Date  06/26/18      PT LONG TERM GOAL #2   Title  She will report pain decr in each hip 50% or more     Baseline  minimal changes with exercises noted, but for todays assessment 15-20% improvement    Time  4    Period  Weeks    Status  On-going    Target Date  06/26/18      PT LONG TERM GOAL #3   Title  She will report pain as intermittant    Time  6    Period  Weeks    Status  Achieved    Target Date  06/26/18      PT LONG TERM GOAL #4   Title  She will report able to walk 45-60 min  before pain incr    Baseline  38 min before pain increases    Time  4    Period  Weeks    Status  On-going    Target Date  06/26/18      PT LONG TERM GOAL #5   Title  She will report 50% decr pain and stiffness in AM on waking    Baseline  mayb 10% improvement in the morning    Time  4    Period  Weeks    Status  On-going    Target Date  06/26/18            Plan - 05/29/18 0856    Clinical Impression Statement  [name] reports some improvement  with her exercise at home and reports pain as intermittent. She is progressing with goals meeting LTG #3 and progress with remaining goals, lack of progress due to difficulty getting into PT due to scheduling conflicts. Educated and performed TPDN along the glute medius followed with IASTM techniques. She was able to perform exercise with no complaint of pain. end of session she reported soreness from DN reporting pain dropped to 6/10. utilized MHp end of session for soreness. She would benefit from continued physical therapy to reduce muscle soreness, improve hip strength, work toward remaining goals and independent exercise. discussed with pt if no progress is made in the remaining visits plan to refer back to MD for further reassessment.     PT Frequency  2x /  week    PT Duration  4 weeks    PT Treatment/Interventions  Iontophoresis 4mg /ml Dexamethasone;Moist Heat;Ultrasound;Therapeutic exercise;Patient/family education;Manual techniques;Passive range of motion;Taping;Dry needling;ADLs/Self Care Home Management;Cryotherapy;Electrical Stimulation;Neuromuscular re-education;Therapeutic activities    PT Next Visit Plan  response to DN, STW, hip strengthening, progressing endurance, modalities PRN    PT Home Exercise Plan  knee to chest to same and opposite shoulder, ITB stretch , bridge and side hip abduciton, STW with tennis ball    Consulted and Agree with Plan of Care  Patient       Patient will benefit from skilled therapeutic intervention in order to improve the following deficits and impairments:  Pain, Postural dysfunction, Decreased strength, Increased muscle spasms  Visit Diagnosis: Pain in right hip - Plan: PT plan of care cert/re-cert, CANCELED: PT plan of care cert/re-cert  Pain in left hip - Plan: PT plan of care cert/re-cert, CANCELED: PT plan of care cert/re-cert  Abnormal posture - Plan: PT plan of care cert/re-cert, CANCELED: PT plan of care cert/re-cert  Muscle weakness (generalized) - Plan: PT plan of care cert/re-cert, CANCELED: PT plan of care cert/re-cert     Problem List Patient Active Problem List   Diagnosis Date Noted  . Ringworm 05/17/2018  . OA (osteoarthritis) of hip 03/15/2018  . Sleep-wake cycle disorder 03/15/2018  . Trochanteric bursitis of both hips 12/19/2017  . Abdominal pain, left lower quadrant 11/28/2017  . Encounter for disability assessment 11/13/2017  . Chronic hand pain, right 10/16/2017  . Hypertrophic lichen planus 34/19/3790  . Mood disturbance 09/07/2017  . Left knee pain 07/04/2017  . Constipation 05/17/2017  . POLYURIA 12/29/2009  . ONYCHOMYCOSIS, TOENAILS 02/01/2009  . FIBROIDS, UTERUS 04/29/2008  . MENORRHAGIA 03/06/2008  . HYPERCHOLESTEROLEMIA 02/07/2007  . PANIC ATTACKS 02/07/2007   . BACK PAIN, LOW 02/07/2007  . INCONTINENCE, FECAL 02/07/2007   Starr Lake PT, DPT, LAT, ATC  05/29/18  9:26 AM      [facility] [address] [place], [place], [address] Phone: [phone]   Fax:  [phone]  Name: Danielle Villa MRN: [id number] Date of Birth: [birth year]

## 2018-05-29 NOTE — Addendum Note (Signed)
Addended by: Larey Days on: 05/29/2018 09:22 AM   Modules accepted: Orders

## 2018-05-29 NOTE — Patient Instructions (Signed)

## 2018-05-31 ENCOUNTER — Encounter

## 2018-06-03 ENCOUNTER — Ambulatory Visit: Payer: Medicaid Other

## 2018-06-03 DIAGNOSIS — R293 Abnormal posture: Secondary | ICD-10-CM

## 2018-06-03 DIAGNOSIS — M25551 Pain in right hip: Secondary | ICD-10-CM

## 2018-06-03 DIAGNOSIS — M6281 Muscle weakness (generalized): Secondary | ICD-10-CM

## 2018-06-03 DIAGNOSIS — M25552 Pain in left hip: Secondary | ICD-10-CM

## 2018-06-03 NOTE — Therapy (Signed)
[place], [place], [address] Phone: [phone]   Fax:  [phone]  Physical Therapy Treatment  Patient Details  Name: Danielle Villa MRN: [id number] Date of Birth: October 16, 1961 Referring Provider: Jake Seats, MD   Encounter Date: 06/03/2018  PT End of Session - 06/03/18 1536    Visit Number  5    Number of Visits  12    Date for PT Re-Evaluation  06/26/18    Authorization Type  MCD (reassess at 12th visit)    Authorization Time Period  05/24/18    PT Start Time  1536    PT Stop Time  1625    PT Time Calculation (min)  49 min    Activity Tolerance  Patient tolerated treatment well    Behavior During Therapy  [facility] for tasks assessed/performed       Past Medical History:  Diagnosis Date  . Back pain     History reviewed. No pertinent surgical history.  There were no vitals filed for this visit.  Subjective Assessment - 06/03/18 1538    Subjective  "I am doing alittle better at a 6/10 today, and the Dry needling helped some. my l hip is more sore today"     Currently in Pain?  Yes    Pain Score  6     Pain Orientation  Right;Left    Pain Onset  More than a month ago    Pain Frequency  Intermittent    Aggravating Factors   standing                        [facility] Adult PT Treatment/Exercise - 06/03/18 1555      Knee/Hip Exercises: Supine   Bridges  Limitations    Bridges Limitations  Shoulder bridge with cuing for PPT  and segmental lower and raise    Bridges with [employer]  Both with PPT    Bridges with Clamshell  -- 12 reps with PPT     Other Supine Knee/Hip Exercises  PPT with much verbal and tactile during      Knee/Hip Exercises: Sidelying   Hip ABduction  Left 12 reps    Clams  12 reps LT       Moist Heat Therapy   Number Minutes Moist Heat  10 Minutes    Moist Heat Location  Hip LT       Manual Therapy   Manual therapy comments  skilled palpation and  monitoring of pt throughout TPDN Bt Carlus Pavlov, DPT    Soft tissue mobilization  IASTM along the glute med LT       Trigger Point Dry Needling - 06/03/18 1543    Consent Given?  Yes    Education Handout Provided  No given previous    Muscles Treated Lower Body  Gluteus minimus    Gluteus Minimus Response  Twitch response elicited;Palpable increased muscle length L hip today             PT Short Term Goals - 05/29/18 0836      PT SHORT TERM GOAL #1   Title  She will be independent with initial HEP    Time  2    Period  Weeks    Status  Achieved      PT SHORT TERM GOAL #2   Title  She will report pain decr 10-15% or more in each hip    Baseline  minimal  change with exercise    Time  2    Period  Weeks    Status  On-going        PT Long Term Goals - 05/29/18 0836      PT LONG TERM GOAL #1   Title  She will be independent with all HEP issued    Baseline  Independent with current HEP and progressing as tolerated.     Time  4    Period  Weeks    Status  On-going    Target Date  06/26/18      PT LONG TERM GOAL #2   Title  She will report pain decr in each hip 50% or more     Baseline  minimal changes with exercises noted, but for todays assessment 15-20% improvement    Time  4    Period  Weeks    Status  On-going    Target Date  06/26/18      PT LONG TERM GOAL #3   Title  She will report pain as intermittant    Time  6    Period  Weeks    Status  Achieved    Target Date  06/26/18      PT LONG TERM GOAL #4   Title  She will report able to walk 45-60 min  before pain incr    Baseline  38 min before pain increases    Time  4    Period  Weeks    Status  On-going    Target Date  06/26/18      PT LONG TERM GOAL #5   Title  She will report 50% decr pain and stiffness in AM on waking    Baseline  mayb 10% improvement in the morning    Time  4    Period  Weeks    Status  On-going    Target Date  06/26/18            Plan - 06/03/18 1528     Clinical Impression Statement  RT hip better today LT problematic. She was unable to do PPT at first but significantly improved and able to do correctly  with other  core exercises.       Appears improved with DN.   She was very tender hip posterior to trochantor with IASTIML    PT Treatment/Interventions  Iontophoresis 4mg /ml Dexamethasone;Moist Heat;Ultrasound;Therapeutic exercise;Patient/family education;Manual techniques;Passive range of motion;Taping;Dry needling;ADLs/Self Care Home Management;Cryotherapy;Electrical Stimulation;Neuromuscular re-education;Therapeutic activities    PT Next Visit Plan  response to DN, STW, hip strengthening, progressing endurance, modalities PRN    PT Home Exercise Plan  knee to chest to same and opposite shoulder, ITB stretch , bridge and side hip abduciton, STW with tennis ball    Consulted and Agree with Plan of Care  Patient       Patient will benefit from skilled therapeutic intervention in order to improve the following deficits and impairments:  Pain, Postural dysfunction, Decreased strength, Increased muscle spasms  Visit Diagnosis: Pain in right hip  Pain in left hip  Abnormal posture  Muscle weakness (generalized)     Problem List Patient Active Problem List   Diagnosis Date Noted  . Ringworm 05/17/2018  . OA (osteoarthritis) of hip 03/15/2018  . Sleep-wake cycle disorder 03/15/2018  . Trochanteric bursitis of both hips 12/19/2017  . Abdominal pain, left lower quadrant 11/28/2017  . Encounter for disability assessment 11/13/2017  . Chronic hand pain, right 10/16/2017  . Hypertrophic  lichen planus 57/47/3403  . Mood disturbance 09/07/2017  . Left knee pain 07/04/2017  . Constipation 05/17/2017  . POLYURIA 12/29/2009  . ONYCHOMYCOSIS, TOENAILS 02/01/2009  . FIBROIDS, UTERUS 04/29/2008  . MENORRHAGIA 03/06/2008  . HYPERCHOLESTEROLEMIA 02/07/2007  . PANIC ATTACKS 02/07/2007  . BACK PAIN, LOW 02/07/2007  . INCONTINENCE, FECAL  02/07/2007    Darrel Hoover  PT 06/03/2018, 4:25 PM  [facility] [address] [place], [place], [address] Phone: [phone]   Fax:  [phone]  Name: Danielle Villa MRN: [id number] Date of Birth: [birth year]

## 2018-06-11 ENCOUNTER — Ambulatory Visit: Payer: Medicaid Other | Admitting: Physical Therapy

## 2018-06-19 ENCOUNTER — Encounter: Payer: Self-pay | Admitting: Physical Therapy

## 2018-06-19 ENCOUNTER — Ambulatory Visit: Payer: Medicaid Other | Attending: Family Medicine | Admitting: Physical Therapy

## 2018-06-19 DIAGNOSIS — R293 Abnormal posture: Secondary | ICD-10-CM

## 2018-06-19 DIAGNOSIS — M6281 Muscle weakness (generalized): Secondary | ICD-10-CM | POA: Diagnosis not present

## 2018-06-19 DIAGNOSIS — M25552 Pain in left hip: Secondary | ICD-10-CM | POA: Diagnosis not present

## 2018-06-19 DIAGNOSIS — M25551 Pain in right hip: Secondary | ICD-10-CM

## 2018-06-19 DIAGNOSIS — Z7689 Persons encountering health services in other specified circumstances: Secondary | ICD-10-CM | POA: Diagnosis not present

## 2018-06-19 NOTE — Therapy (Signed)
[place], [place], [address] Phone: [phone]   Fax:  [phone]  Physical Therapy Treatment  Patient Details  Name: Danielle Villa MRN: [id number] Date of Birth: 1961/05/26 Referring Provider: Jake Seats, MD   Encounter Date: 06/19/2018  PT End of Session - 06/19/18 0915    Visit Number  6    Number of Visits  12    Date for PT Re-Evaluation  06/26/18    Authorization Type  MCD (reassess at 12th visit)    Authorization Time Period  07/01/2018    PT Start Time  0849    PT Stop Time  0931    PT Time Calculation (min)  42 min    Activity Tolerance  Patient tolerated treatment well       Past Medical History:  Diagnosis Date  . Back pain     History reviewed. No pertinent surgical history.  There were no vitals filed for this visit.  Subjective Assessment - 06/19/18 0852    Subjective  Patient reports the pain in her hip is about the same. it is about a 7/10 in her right hip today.     Limitations  Walking    How long can you walk comfortably?  30 min     Diagnostic tests  Xrays : Mild OA per MD note    Patient Stated Goals  improve standing/ walking endurance, decreasing pain,     Currently in Pain?  Yes    Pain Score  7     Pain Location  Hip    Pain Orientation  Right    Pain Descriptors / Indicators  Aching    Pain Onset  More than a month ago    Pain Frequency  Intermittent    Aggravating Factors   standing     Pain Relieving Factors  meadication , heat    Effect of Pain on Daily Activities  difficulty walking for long periods of time    Multiple Pain Sites  No                       [facility] Adult PT Treatment/Exercise - 06/19/18 0001      Knee/Hip Exercises: Stretches   Active Hamstring Stretch  Right;Left;2 reps;30 seconds    Other Knee/Hip Stretches  glute med stretch 2 x 30 sec    Other Knee/Hip Stretches  lower trunk rotations 2x10       Knee/Hip  Exercises: Standing   Other Standing Knee Exercises  reviewed tennis ball release       Knee/Hip Exercises: Supine   Bridges  2 sets;10 reps    Other Supine Knee/Hip Exercises  PPT with march     Other Supine Knee/Hip Exercises  supine march 2x10       Moist Heat Therapy   Number Minutes Moist Heat  10 Minutes    Moist Heat Location  Hip      Manual Therapy   Manual therapy comments  skilled palpation and monitoring of pt throughout TPDN Bt Carlus Pavlov, DPT       Trigger Point Dry Needling - 06/19/18 1054    Consent Given?  Yes    Gluteus Minimus Response  Twitch response elicited    Piriformis Response  Twitch response elicited left pirifomis            PT Education - 06/19/18 0914    Education provided  Yes    Education  Details  benefits and risks of TPDN; preventing post needle soreness    Person(s) Educated  Patient    Methods  Explanation;Demonstration;Tactile cues;Verbal cues    Comprehension  Verbalized understanding;Returned demonstration;Verbal cues required;Tactile cues required       PT Short Term Goals - 06/19/18 0924      PT SHORT TERM GOAL #1   Title  She will be independent with initial HEP    Baseline  incr frequency to daily    Time  2    Period  Weeks    Status  Achieved      PT SHORT TERM GOAL #2   Title  She will report pain decr 10-15% or more in each hip    Baseline  minimal change with exercise    Time  2    Period  Weeks    Status  On-going        PT Long Term Goals - 05/29/18 6295      PT LONG TERM GOAL #1   Title  She will be independent with all HEP issued    Baseline  Independent with current HEP and progressing as tolerated.     Time  4    Period  Weeks    Status  On-going    Target Date  06/26/18      PT LONG TERM GOAL #2   Title  She will report pain decr in each hip 50% or more     Baseline  minimal changes with exercises noted, but for todays assessment 15-20% improvement    Time  4    Period  Weeks    Status   On-going    Target Date  06/26/18      PT LONG TERM GOAL #3   Title  She will report pain as intermittant    Time  6    Period  Weeks    Status  Achieved    Target Date  06/26/18      PT LONG TERM GOAL #4   Title  She will report able to walk 45-60 min  before pain incr    Baseline  38 min before pain increases    Time  4    Period  Weeks    Status  On-going    Target Date  06/26/18      PT LONG TERM GOAL #5   Title  She will report 50% decr pain and stiffness in AM on waking    Baseline  mayb 10% improvement in the morning    Time  4    Period  Weeks    Status  On-going    Target Date  06/26/18            Plan - 06/19/18 2841    Clinical Impression Statement  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 She would only let therapy do a total of three needles with max encouragement and cuing. Therapy needled her left piriformis and her right glut medius. She would have benefited from her right piriformis being needled as well but she declined. Therapy reviewed strengthening program with her for home. She has one more session scheduled. Assess tolerance to needling.     Clinical Presentation  Stable    Clinical Decision Making  Low    Rehab Potential  Good    PT Frequency  2x / week    PT Duration  4 weeks    PT Treatment/Interventions  Iontophoresis  4mg /ml Dexamethasone;Moist Heat;Ultrasound;Therapeutic exercise;Patient/family education;Manual techniques;Passive range of motion;Taping;Dry needling;ADLs/Self Care Home Management;Cryotherapy;Electrical Stimulation;Neuromuscular re-education;Therapeutic activities    PT Next Visit Plan  response to DN, STW, hip strengthening, progressing endurance, modalities PRN    PT Home Exercise Plan  knee to chest to same and opposite shoulder, ITB stretch , bridge and side hip abduciton, STW with tennis ball       Patient will benefit from skilled therapeutic intervention in order to improve the following deficits and impairments:   Pain, Postural dysfunction, Decreased strength, Increased muscle spasms  Visit Diagnosis: Pain in right hip  Pain in left hip  Abnormal posture  Muscle weakness (generalized)     Problem List Patient Active Problem List   Diagnosis Date Noted  . Ringworm 05/17/2018  . OA (osteoarthritis) of hip 03/15/2018  . Sleep-wake cycle disorder 03/15/2018  . Trochanteric bursitis of both hips 12/19/2017  . Abdominal pain, left lower quadrant 11/28/2017  . Encounter for disability assessment 11/13/2017  . Chronic hand pain, right 10/16/2017  . Hypertrophic lichen planus 36/46/8032  . Mood disturbance 09/07/2017  . Left knee pain 07/04/2017  . Constipation 05/17/2017  . POLYURIA 12/29/2009  . ONYCHOMYCOSIS, TOENAILS 02/01/2009  . FIBROIDS, UTERUS 04/29/2008  . MENORRHAGIA 03/06/2008  . HYPERCHOLESTEROLEMIA 02/07/2007  . PANIC ATTACKS 02/07/2007  . BACK PAIN, LOW 02/07/2007  . INCONTINENCE, FECAL 02/07/2007    Carney Living PT DPT  06/19/2018, 1:00 PM  [facility] [address] [place], [place], [address] Phone: [phone]   Fax:  [phone]  Name: MONE COMMISSO MRN: [id number] Date of Birth: Apr 19, 1961

## 2018-06-24 DIAGNOSIS — S134XXA Sprain of ligaments of cervical spine, initial encounter: Secondary | ICD-10-CM | POA: Diagnosis not present

## 2018-06-24 DIAGNOSIS — M5137 Other intervertebral disc degeneration, lumbosacral region: Secondary | ICD-10-CM | POA: Diagnosis not present

## 2018-06-24 DIAGNOSIS — M9901 Segmental and somatic dysfunction of cervical region: Secondary | ICD-10-CM | POA: Diagnosis not present

## 2018-06-24 DIAGNOSIS — M5414 Radiculopathy, thoracic region: Secondary | ICD-10-CM | POA: Diagnosis not present

## 2018-06-24 DIAGNOSIS — M9902 Segmental and somatic dysfunction of thoracic region: Secondary | ICD-10-CM | POA: Diagnosis not present

## 2018-06-24 DIAGNOSIS — M9903 Segmental and somatic dysfunction of lumbar region: Secondary | ICD-10-CM | POA: Diagnosis not present

## 2018-06-24 DIAGNOSIS — Z7689 Persons encountering health services in other specified circumstances: Secondary | ICD-10-CM | POA: Diagnosis not present

## 2018-06-26 ENCOUNTER — Ambulatory Visit: Payer: Medicaid Other | Admitting: Physical Therapy

## 2018-06-26 ENCOUNTER — Encounter: Payer: Self-pay | Admitting: Physical Therapy

## 2018-06-26 DIAGNOSIS — M6281 Muscle weakness (generalized): Secondary | ICD-10-CM

## 2018-06-26 DIAGNOSIS — R293 Abnormal posture: Secondary | ICD-10-CM | POA: Diagnosis not present

## 2018-06-26 DIAGNOSIS — M25551 Pain in right hip: Secondary | ICD-10-CM | POA: Diagnosis not present

## 2018-06-26 DIAGNOSIS — F4310 Post-traumatic stress disorder, unspecified: Secondary | ICD-10-CM | POA: Diagnosis not present

## 2018-06-26 DIAGNOSIS — M25552 Pain in left hip: Secondary | ICD-10-CM | POA: Diagnosis not present

## 2018-06-26 DIAGNOSIS — Z7689 Persons encountering health services in other specified circumstances: Secondary | ICD-10-CM | POA: Diagnosis not present

## 2018-06-26 NOTE — Therapy (Addendum)
[place], [place], [address] Phone: [phone]   Fax:  [phone]  Physical Therapy Treatment  Patient Details  Name: Danielle Villa MRN: [id number] Date of Birth: 21-Dec-1960 Referring Provider: Jake Seats, MD   Encounter Date: 06/26/2018  PT End of Session - 06/26/18 1158    Visit Number  7    Number of Visits  12    Date for PT Re-Evaluation  06/26/18    Authorization Type  MCD (reassess at 12th visit)    PT Start Time  1148    PT Stop Time  1239    PT Time Calculation (min)  51 min    Activity Tolerance  Patient tolerated treatment well    Behavior During Therapy  [facility] for tasks assessed/performed       Past Medical History:  Diagnosis Date  . Back pain     No past surgical history on file.  There were no vitals filed for this visit.  Subjective Assessment - 06/26/18 1152    Subjective  "I am still having hip pain. I was sore the day of needling, but I felt better after."    Currently in Pain?  Yes    Pain Score  9     Pain Location  Hip    Pain Orientation  Right    Pain Descriptors / Indicators  Aching    Pain Type  Chronic pain    Pain Onset  More than a month ago    Pain Frequency  Intermittent                       [facility] Adult PT Treatment/Exercise - 06/26/18 0001      Knee/Hip Exercises: Stretches   Passive Hamstring Stretch  2 reps;30 seconds;Right    Other Knee/Hip Stretches  glute med stretch 2 x 30 sec      Knee/Hip Exercises: Aerobic   Nustep  LE L3 x 5 min      Moist Heat Therapy   Number Minutes Moist Heat  10 Minutes    Moist Heat Location  Hip      Manual Therapy   Manual therapy comments  skilled palpation and monitoring of pt throughout TPDN    Soft tissue mobilization  IASTM along the glute med       Trigger Point Dry Needling - 06/26/18 1244    Muscles Treated Lower Body  Gluteus minimus right    Gluteus Minimus Response   Twitch response elicited             PT Short Term Goals - 06/19/18 0924      PT SHORT TERM GOAL #1   Title  She will be independent with initial HEP    Baseline  incr frequency to daily    Time  2    Period  Weeks    Status  Achieved      PT SHORT TERM GOAL #2   Title  She will report pain decr 10-15% or more in each hip    Baseline  minimal change with exercise    Time  2    Period  Weeks    Status  On-going        PT Long Term Goals - 05/29/18 6962      PT LONG TERM GOAL #1   Title  She will be independent with all HEP issued    Baseline  Independent with current  HEP and progressing as tolerated.     Time  4    Period  Weeks    Status  On-going    Target Date  06/26/18      PT LONG TERM GOAL #2   Title  She will report pain decr in each hip 50% or more     Baseline  minimal changes with exercises noted, but for todays assessment 15-20% improvement    Time  4    Period  Weeks    Status  On-going    Target Date  06/26/18      PT LONG TERM GOAL #3   Title  She will report pain as intermittant    Time  6    Period  Weeks    Status  Achieved    Target Date  06/26/18      PT LONG TERM GOAL #4   Title  She will report able to walk 45-60 min  before pain incr    Baseline  38 min before pain increases    Time  4    Period  Weeks    Status  On-going    Target Date  06/26/18      PT LONG TERM GOAL #5   Title  She will report 50% decr pain and stiffness in AM on waking    Baseline  mayb 10% improvement in the morning    Time  4    Period  Weeks    Status  On-going    Target Date  06/26/18            Plan - 06/26/18 1233    Clinical Impression Statement  Pt reports 9/10 pain upon arrival. She elected to do TPDN again as this relieved her pain last time. Followed TPDN of R glute med with IASTM of R glute med. Stretching of R hamstring and R glute med following soft tissue mobilization. Pt reports decreased pain following session (7/10).    PT  Treatment/Interventions  Iontophoresis 4mg /ml Dexamethasone;Moist Heat;Ultrasound;Therapeutic exercise;Patient/family education;Manual techniques;Passive range of motion;Taping;Dry needling;ADLs/Self Care Home Management;Cryotherapy;Electrical Stimulation;Neuromuscular re-education;Therapeutic activities    PT Next Visit Plan  reevalution (if no change, consider DC), response to DN, STW, hip strengthening, progressing endurance, modalities PRN    PT Home Exercise Plan  knee to chest to same and opposite shoulder, ITB stretch , bridge and side hip abduciton, STW with tennis ball    Consulted and Agree with Plan of Care  Patient       Patient will benefit from skilled therapeutic intervention in order to improve the following deficits and impairments:  Pain, Postural dysfunction, Decreased strength, Increased muscle spasms  Visit Diagnosis: Pain in right hip  Abnormal posture  Muscle weakness (generalized)     Problem List Patient Active Problem List   Diagnosis Date Noted  . Ringworm 05/17/2018  . OA (osteoarthritis) of hip 03/15/2018  . Sleep-wake cycle disorder 03/15/2018  . Trochanteric bursitis of both hips 12/19/2017  . Abdominal pain, left lower quadrant 11/28/2017  . Encounter for disability assessment 11/13/2017  . Chronic hand pain, right 10/16/2017  . Hypertrophic lichen planus 82/42/3536  . Mood disturbance 09/07/2017  . Left knee pain 07/04/2017  . Constipation 05/17/2017  . POLYURIA 12/29/2009  . ONYCHOMYCOSIS, TOENAILS 02/01/2009  . FIBROIDS, UTERUS 04/29/2008  . MENORRHAGIA 03/06/2008  . HYPERCHOLESTEROLEMIA 02/07/2007  . PANIC ATTACKS 02/07/2007  . BACK PAIN, LOW 02/07/2007  . INCONTINENCE, FECAL 02/07/2007    Worthy Flank, SPT 06/26/18  1:28 PM   [place], [place], [address] Phone: [phone]   Fax:  [phone]  Name: Danielle Villa MRN: [id number] Date of Birth:  1961-07-15

## 2018-06-27 ENCOUNTER — Telehealth: Payer: Self-pay | Admitting: Family Medicine

## 2018-06-27 NOTE — Telephone Encounter (Signed)
LVM to schedule phy. Please assist in doing so

## 2018-07-01 ENCOUNTER — Ambulatory Visit: Payer: Medicaid Other

## 2018-07-01 DIAGNOSIS — M6281 Muscle weakness (generalized): Secondary | ICD-10-CM

## 2018-07-01 DIAGNOSIS — R293 Abnormal posture: Secondary | ICD-10-CM

## 2018-07-01 DIAGNOSIS — M25551 Pain in right hip: Secondary | ICD-10-CM | POA: Diagnosis not present

## 2018-07-01 DIAGNOSIS — M25552 Pain in left hip: Secondary | ICD-10-CM

## 2018-07-01 DIAGNOSIS — Z7689 Persons encountering health services in other specified circumstances: Secondary | ICD-10-CM | POA: Diagnosis not present

## 2018-07-01 NOTE — Therapy (Addendum)
[place], [place], [address] Phone: [phone]   Fax:  [phone]  Physical Therapy Treatment/Discharge  Patient Details  Name: SHIANNA BALLY MRN: [id number] Date of Birth: March 21, 1961 Referring Provider: Jake Seats, MD   Encounter Date: 07/01/2018  PT End of Session - 07/01/18 1532    Visit Number  8    Number of Visits  12    Date for PT Re-Evaluation  06/26/18    Authorization Time Period  07/01/2018    Authorization - Visit Number  3    Authorization - Number of Visits  4    PT Start Time  0300    PT Stop Time  0325    PT Time Calculation (min)  25 min    Activity Tolerance  Patient tolerated treatment well    Behavior During Therapy  [facility] for tasks assessed/performed       Past Medical History:  Diagnosis Date  . Back pain     History reviewed. No pertinent surgical history.  There were no vitals filed for this visit.  Subjective Assessment - 07/01/18 1506    Subjective  She reports done with DN last week and so not as bad.     Will call MD tomorrow. She report that she is not better.      Pain Score  6     Pain Location  Hip    Pain Descriptors / Indicators  Aching    Pain Type  Chronic pain    Pain Onset  More than a month ago    Pain Frequency  Constant    Aggravating Factors   stand    Pain Relieving Factors  meds , heat     Multiple Pain Sites  No                       [facility] Adult PT Treatment/Exercise - 07/01/18 0001      Self-Care   Self-Care  Other Self-Care Comments    Other Self-Care Comments   Disscussed going to gym and how to progress conditioning  and strength and joining a lass at level comfortable for her. Sugested she talsk to instructors and searce out trainers in facility she joins.              PT Education - 07/01/18 1530    Education provided  Yes    Education Details  iscussed benefits of going to gym for exercise and to  speak to gym /class instructors for assistance and to start slow and progress gradually with activity. suggested no TM until better conditioned.     Person(s) Educated  Patient    Methods  Explanation    Comprehension  Verbalized understanding       PT Short Term Goals - 07/01/18 1509      PT SHORT TERM GOAL #1   Title  She will be independent with initial HEP    Status  Achieved      PT SHORT TERM GOAL #2   Title  She will report pain decr 10-15% or more in each hip    Baseline  no     Status  Not Met        PT Long Term Goals - 07/01/18 1511      PT LONG TERM GOAL #1   Title  She will be independent with all HEP issued    Baseline  Independent with current HEP and  progressing as tolerated.     Status  Partially Met      PT LONG TERM GOAL #2   Title  She will report pain decr in each hip 50% or more     Baseline  No improvenment    Status  Not Met      PT LONG TERM GOAL #3   Title  She will report pain as intermittant    Baseline  pain constant     Status  Not Met      PT LONG TERM GOAL #4   Title  She will report able to walk 45-60 min  before pain incr    Baseline  has not walked 30 min in past month except to shop    Status  Not Met      PT LONG TERM GOAL #5   Title  She will report 50% decr pain and stiffness in AM on waking    Baseline  no change    Status  Not Met            Plan - 07/01/18 1533    Clinical Impression Statement  She reports DN helpful but only for short duration . She is not sure meds are cause of less pain at times. She did not acheive any goals except she reports doing HEP.  She agereed to discharge and to investigate geting into a gym or srength and conditioning and access to exercise classes    PT Next Visit Plan  discharge     PT Home Exercise Plan  knee to chest to same and opposite shoulder, ITB stretch , bridge and side hip abduciton, STW with tennis ball    Consulted and Agree with Plan of Care  Patient       Patient will  benefit from skilled therapeutic intervention in order to improve the following deficits and impairments:  Pain, Postural dysfunction, Decreased strength, Increased muscle spasms  Visit Diagnosis: Pain in right hip  Abnormal posture  Muscle weakness (generalized)  Pain in left hip     Problem List Patient Active Problem List   Diagnosis Date Noted  . Ringworm 05/17/2018  . OA (osteoarthritis) of hip 03/15/2018  . Sleep-wake cycle disorder 03/15/2018  . Trochanteric bursitis of both hips 12/19/2017  . Abdominal pain, left lower quadrant 11/28/2017  . Encounter for disability assessment 11/13/2017  . Chronic hand pain, right 10/16/2017  . Hypertrophic lichen planus 81/59/4707  . Mood disturbance 09/07/2017  . Left knee pain 07/04/2017  . Constipation 05/17/2017  . POLYURIA 12/29/2009  . ONYCHOMYCOSIS, TOENAILS 02/01/2009  . FIBROIDS, UTERUS 04/29/2008  . MENORRHAGIA 03/06/2008  . HYPERCHOLESTEROLEMIA 02/07/2007  . PANIC ATTACKS 02/07/2007  . BACK PAIN, LOW 02/07/2007  . INCONTINENCE, FECAL 02/07/2007    Darrel Hoover PT 07/01/2018, 3:39 PM  [facility] [address] [place], [place], [address] Phone: [phone]   Fax:  [phone]  Name: ELLESSE ANTENUCCI MRN: [id number] Date of Birth: [birth year] PHYSICAL THERAPY DISCHARGE SUMMARY  Visits from Start of Care: 8  Current functional level related to goals / functional outcomes: See above . No improvement   Remaining deficits: See above . No improvement   Education / Equipment: HEP Plan: Patient agrees to discharge.  Patient goals were not met. Patient is being discharged due to lack of progress.  ?????

## 2018-07-03 ENCOUNTER — Encounter: Payer: Medicaid Other | Admitting: Physical Therapy

## 2018-07-10 ENCOUNTER — Ambulatory Visit (INDEPENDENT_AMBULATORY_CARE_PROVIDER_SITE_OTHER): Payer: Medicaid Other | Admitting: Psychiatry

## 2018-07-10 ENCOUNTER — Encounter

## 2018-07-10 ENCOUNTER — Other Ambulatory Visit: Payer: Self-pay | Admitting: Family Medicine

## 2018-07-10 ENCOUNTER — Encounter (HOSPITAL_COMMUNITY): Payer: Self-pay | Admitting: Psychiatry

## 2018-07-10 DIAGNOSIS — S134XXA Sprain of ligaments of cervical spine, initial encounter: Secondary | ICD-10-CM | POA: Diagnosis not present

## 2018-07-10 DIAGNOSIS — Z7689 Persons encountering health services in other specified circumstances: Secondary | ICD-10-CM | POA: Diagnosis not present

## 2018-07-10 DIAGNOSIS — M5137 Other intervertebral disc degeneration, lumbosacral region: Secondary | ICD-10-CM | POA: Diagnosis not present

## 2018-07-10 DIAGNOSIS — M9901 Segmental and somatic dysfunction of cervical region: Secondary | ICD-10-CM | POA: Diagnosis not present

## 2018-07-10 DIAGNOSIS — M9902 Segmental and somatic dysfunction of thoracic region: Secondary | ICD-10-CM | POA: Diagnosis not present

## 2018-07-10 DIAGNOSIS — F330 Major depressive disorder, recurrent, mild: Secondary | ICD-10-CM | POA: Diagnosis not present

## 2018-07-10 DIAGNOSIS — Z818 Family history of other mental and behavioral disorders: Secondary | ICD-10-CM

## 2018-07-10 DIAGNOSIS — M5414 Radiculopathy, thoracic region: Secondary | ICD-10-CM | POA: Diagnosis not present

## 2018-07-10 DIAGNOSIS — M9903 Segmental and somatic dysfunction of lumbar region: Secondary | ICD-10-CM | POA: Diagnosis not present

## 2018-07-10 MED ORDER — PAROXETINE HCL 20 MG PO TABS: 20.0000 mg | ORAL_TABLET | Freq: Every day | ORAL | 1 refills | Status: DC

## 2018-07-10 MED ORDER — TRAZODONE HCL 50 MG PO TABS: 50.0000 mg | ORAL_TABLET | Freq: Every evening | ORAL | 1 refills | Status: DC | PRN
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
## 2018-07-11 DIAGNOSIS — Z7689 Persons encountering health services in other specified circumstances: Secondary | ICD-10-CM | POA: Diagnosis not present

## 2018-07-25 ENCOUNTER — Telehealth: Payer: Self-pay | Admitting: Family Medicine

## 2018-07-25 DIAGNOSIS — M160 Bilateral primary osteoarthritis of hip: Secondary | ICD-10-CM

## 2018-07-25 NOTE — Telephone Encounter (Signed)
Pt would like a referral placed for an orthopedic that accepts medicaid. Please let her know once this referral has been placed.

## 2018-07-26 ENCOUNTER — Other Ambulatory Visit: Payer: Self-pay

## 2018-07-26 ENCOUNTER — Emergency Department (HOSPITAL_COMMUNITY)
Admission: EM | Admit: 2018-07-26 | Discharge: 2018-07-27 | Disposition: A | Discharge status: Home / Self Care | Payer: Medicaid Other | Attending: Emergency Medicine | Admitting: Emergency Medicine

## 2018-07-26 ENCOUNTER — Encounter (HOSPITAL_COMMUNITY): Payer: Self-pay | Admitting: Emergency Medicine

## 2018-07-26 ENCOUNTER — Emergency Department (HOSPITAL_COMMUNITY): Payer: Medicaid Other

## 2018-07-26 DIAGNOSIS — M9901 Segmental and somatic dysfunction of cervical region: Secondary | ICD-10-CM | POA: Diagnosis not present

## 2018-07-26 DIAGNOSIS — M9903 Segmental and somatic dysfunction of lumbar region: Secondary | ICD-10-CM | POA: Diagnosis not present

## 2018-07-26 DIAGNOSIS — R6884 Jaw pain: Secondary | ICD-10-CM

## 2018-07-26 DIAGNOSIS — R1084 Generalized abdominal pain: Secondary | ICD-10-CM

## 2018-07-26 DIAGNOSIS — Z79899 Other long term (current) drug therapy: Secondary | ICD-10-CM | POA: Diagnosis not present

## 2018-07-26 DIAGNOSIS — Z7689 Persons encountering health services in other specified circumstances: Secondary | ICD-10-CM | POA: Diagnosis not present

## 2018-07-26 DIAGNOSIS — R509 Fever, unspecified: Secondary | ICD-10-CM | POA: Insufficient documentation

## 2018-07-26 DIAGNOSIS — R000 Tachycardia, unspecified: Secondary | ICD-10-CM | POA: Diagnosis not present

## 2018-07-26 DIAGNOSIS — M9902 Segmental and somatic dysfunction of thoracic region: Secondary | ICD-10-CM | POA: Diagnosis not present

## 2018-07-26 DIAGNOSIS — Z7982 Long term (current) use of aspirin: Secondary | ICD-10-CM | POA: Insufficient documentation

## 2018-07-26 DIAGNOSIS — R079 Chest pain, unspecified: Secondary | ICD-10-CM | POA: Diagnosis not present

## 2018-07-26 DIAGNOSIS — R0602 Shortness of breath: Secondary | ICD-10-CM | POA: Diagnosis not present

## 2018-07-26 DIAGNOSIS — M5137 Other intervertebral disc degeneration, lumbosacral region: Secondary | ICD-10-CM | POA: Diagnosis not present

## 2018-07-26 DIAGNOSIS — S134XXA Sprain of ligaments of cervical spine, initial encounter: Secondary | ICD-10-CM | POA: Diagnosis not present

## 2018-07-26 DIAGNOSIS — R109 Unspecified abdominal pain: Secondary | ICD-10-CM | POA: Diagnosis present

## 2018-07-26 DIAGNOSIS — R52 Pain, unspecified: Secondary | ICD-10-CM | POA: Diagnosis not present

## 2018-07-26 DIAGNOSIS — M5414 Radiculopathy, thoracic region: Secondary | ICD-10-CM | POA: Diagnosis not present

## 2018-07-26 DIAGNOSIS — R197 Diarrhea, unspecified: Secondary | ICD-10-CM | POA: Diagnosis not present

## 2018-07-26 LAB — COMPREHENSIVE METABOLIC PANEL
ALBUMIN: 3.7 g/dL (ref 3.5–5.0)
ALK PHOS: 76 U/L (ref 38–126)
ALT: 11 U/L (ref 0–44)
AST: 16 U/L (ref 15–41)
Anion gap: 12 (ref 5–15)
BILIRUBIN TOTAL: 0.4 mg/dL (ref 0.3–1.2)
BUN: 12 mg/dL (ref 6–20)
CALCIUM: 9.3 mg/dL (ref 8.9–10.3)
CO2: 28 mmol/L (ref 22–32)
CREATININE: 0.7 mg/dL (ref 0.44–1.00)
Chloride: 103 mmol/L (ref 98–111)
GFR calc Af Amer: >60 mL/min (ref >60)
GLUCOSE: 111 mg/dL — AB (ref 70–99)
POTASSIUM: 3.5 mmol/L (ref 3.5–5.1)
Sodium: 143 mmol/L (ref 135–145)
TOTAL PROTEIN: 7.9 g/dL (ref 6.5–8.1)

## 2018-07-26 LAB — CBC
HCT: 38.8 % (ref 36.0–46.0)
Hemoglobin: 12.3 g/dL (ref 12.0–15.0)
MCH: 27.8 pg (ref 26.0–34.0)
MCHC: 31.7 g/dL (ref 30.0–36.0)
MCV: 87.8 fL (ref 78.0–100.0)
PLATELETS: 449 10*3/uL — AB (ref 150–400)
RBC: 4.42 MIL/uL (ref 3.87–5.11)
RDW: 14.3 % (ref 11.5–15.5)
WBC: 11.6 10*3/uL — AB (ref 4.0–10.5)

## 2018-07-26 LAB — URINALYSIS, ROUTINE W REFLEX MICROSCOPIC
BILIRUBIN URINE: NEGATIVE
Bacteria, UA: NONE SEEN
Glucose, UA: NEGATIVE mg/dL
HGB URINE DIPSTICK: NEGATIVE
Ketones, ur: NEGATIVE mg/dL
LEUKOCYTES UA: NEGATIVE
NITRITE: NEGATIVE
PH: 8 (ref 5.0–8.0)
Protein, ur: 30 mg/dL — AB
SPECIFIC GRAVITY, URINE: 1.018 (ref 1.005–1.030)

## 2018-07-26 LAB — POCT I-STAT TROPONIN I: Troponin i, poc: 0 ng/mL (ref 0.00–0.08)

## 2018-07-26 LAB — LIPASE, BLOOD: Lipase: 26 U/L (ref 11–51)

## 2018-07-26 LAB — I-STAT BETA HCG BLOOD, ED (NOT ORDERABLE)

## 2018-07-26 MED ORDER — SODIUM CHLORIDE 0.9 % IV BOLUS
1000.0000 mL | Freq: Once | INTRAVENOUS | Status: AC
  Administered 2018-07-27: 1000 mL via INTRAVENOUS

## 2018-07-26 MED ORDER — ACETAMINOPHEN 500 MG PO TABS
1000.0000 mg | ORAL_TABLET | Freq: Once | ORAL | Status: AC
  Administered 2018-07-27: 1000 mg via ORAL
  Filled 2018-07-26: qty 2

## 2018-07-26 NOTE — Telephone Encounter (Signed)
Pt is calling back to see when and if the doctor will put in a referral for her to see an ortho doctor. jw

## 2018-07-26 NOTE — ED Provider Notes (Signed)
[facility] DEPT Provider Note   CSN: [id number] Arrival date & time: 07/26/18  2018     History   Chief Complaint Chief Complaint  Patient presents with  . Chest Pain  . Abdominal Pain    HPI Danielle Villa is a 57 y.o. female.  57 yo F with multiple complaints.  Going on for the past 3 or 4 days.  She had sharp diffuse abdominal pain that seems to come and go.  Nothing made it better or worse.  It has gotten somewhat better over the past couple days.  She started having pain to the right jaw a couple days ago as well.  Felt like her heart was somewhat racing.  She is a bit lightheaded upon standing.  She states that she has chest pain when asked what it felt like she said like her heart racing.  She also is complaining of tingling to her hands bilaterally and then her right arm and leg.  She felt that she is been having some difficulty with ambulation due to her chronic hip pain.  The history is provided by the patient.  Chest Pain   Associated symptoms include abdominal pain, headaches, nausea and shortness of breath. Pertinent negatives include no dizziness, no fever, no palpitations and no vomiting.  Abdominal Pain   Associated symptoms include diarrhea, nausea and headaches. Pertinent negatives include fever, vomiting, dysuria, arthralgias and myalgias.  Illness  This is a new problem. The current episode started yesterday. The problem occurs constantly. The problem has not changed since onset.Associated symptoms include chest pain, abdominal pain, headaches and shortness of breath. Nothing aggravates the symptoms. Nothing relieves the symptoms. She has tried nothing for the symptoms. The treatment provided no relief.    Past Medical History:  Diagnosis Date  . Back pain     Patient Active Problem List   Diagnosis Date Noted  . Ringworm 05/17/2018  . OA (osteoarthritis) of hip 03/15/2018  . Sleep-wake cycle disorder 03/15/2018  .  Trochanteric bursitis of both hips 12/19/2017  . Abdominal pain, left lower quadrant 11/28/2017  . Encounter for disability assessment 11/13/2017  . Chronic hand pain, right 10/16/2017  . Hypertrophic lichen planus 10/07/2535  . Mood disturbance 09/07/2017  . Left knee pain 07/04/2017  . Constipation 05/17/2017  . POLYURIA 12/29/2009  . ONYCHOMYCOSIS, TOENAILS 02/01/2009  . FIBROIDS, UTERUS 04/29/2008  . MENORRHAGIA 03/06/2008  . HYPERCHOLESTEROLEMIA 02/07/2007  . PANIC ATTACKS 02/07/2007  . BACK PAIN, LOW 02/07/2007  . INCONTINENCE, FECAL 02/07/2007    History reviewed. No pertinent surgical history.   OB History    Gravida  5   Para  4   Term  4   Preterm      AB      Living  4     SAB      TAB      Ectopic      Multiple      Live Births  4            Home Medications    Prior to Admission medications   Medication Sig Start Date End Date Taking? Authorizing Provider  aspirin (ASPIRIN EC) 81 MG EC tablet Take 1 tablet (81 mg total) by mouth daily. 12/31/17  Yes Lovenia Kim, MD  atorvastatin (LIPITOR) 20 MG tablet TAKE 1 TABLET BY MOUTH ONCE DAILY Patient taking differently: Take 20 mg by mouth every evening.  07/11/18  Yes Sela Hilding, MD  Calcium Carbonate-Vitamin D (UYQIHKVQ  600+D) 600-400 MG-UNIT tablet Take 1 tablet by mouth daily. 11/06/17  Yes Lovenia Kim, MD  Melatonin 2.5 MG CAPS Take 1 capsule (2.5 mg total) by mouth at bedtime. 03/13/18  Yes Sela Hilding, MD  PARoxetine (PAXIL) 20 MG tablet Take 1 tablet (20 mg total) by mouth daily. 07/10/18 07/10/19 Yes Cobos, Myer Peer, MD  traMADol (ULTRAM) 50 MG tablet Take 1 tablet (50 mg total) by mouth every 6 (six) hours as needed. 03/13/18  Yes Sela Hilding, MD  traZODone (DESYREL) 50 MG tablet Take 1 tablet (50 mg total) by mouth at bedtime as needed. for sleep 07/10/18  Yes Cobos, Myer Peer, MD    Family History Family History  Problem Relation Age of Onset  . Hypertension  Mother   . Diabetes Mother   . Hypertension Maternal Grandmother     Social History Social History   Tobacco Use  . Smoking status: Never Smoker  . Smokeless tobacco: Never Used  Substance Use Topics  . Alcohol use: Yes    Comment: OCCATIONALLY  . Drug use: No     Allergies   Patient has no known allergies.   Review of Systems Review of Systems  Constitutional: Positive for chills. Negative for fever.  HENT: Positive for congestion. Negative for rhinorrhea.   Eyes: Negative for redness and visual disturbance.  Respiratory: Positive for shortness of breath. Negative for wheezing.   Cardiovascular: Positive for chest pain. Negative for palpitations.  Gastrointestinal: Positive for abdominal pain, diarrhea and nausea. Negative for vomiting.  Genitourinary: Negative for dysuria and urgency.  Musculoskeletal: Negative for arthralgias and myalgias.  Skin: Negative for pallor and wound.  Neurological: Positive for light-headedness and headaches. Negative for dizziness.     Physical Exam Updated Vital Signs BP 98/60 (BP Location: Right Arm)   Pulse 97   Temp 99.1 F (37.3 C) (Oral)   Resp 18   Ht 5\' 2"  (1.575 m)   Wt 61.2 kg   SpO2 97%   BMI 24.69 kg/m   Physical Exam  Constitutional: She is oriented to person, place, and time. She appears well-developed and well-nourished. No distress.  HENT:  Head: Normocephalic and atraumatic.  Right TM is obscured by wax.  The visible upper pole is not red.  Not bulging.  She has pain about the angle of the right jaw.  No intraoral tenderness.  No pain on percussion of her teeth.  She is poor dentition diffusely.  Uvula is midline no tonsillar swelling tolerating secretions without difficulty.  Eyes: Pupils are equal, round, and reactive to light. EOM are normal.  Neck: Normal range of motion. Neck supple.  Cardiovascular: Normal rate and regular rhythm. Exam reveals no gallop and no friction rub.  No murmur  heard. Pulmonary/Chest: Effort normal. She has no wheezes. She has no rales.  Abdominal: Soft. She exhibits no distension and no mass. There is tenderness (mild diffuse). There is no guarding.  Musculoskeletal: She exhibits no edema or tenderness.  Neurological: She is alert and oriented to person, place, and time. She has normal strength. No cranial nerve deficit or sensory deficit. She displays a negative Romberg sign. Coordination and gait normal. She displays no Babinski's sign on the right side. She displays no Babinski's sign on the left side.  Skin: Skin is warm and dry. She is not diaphoretic.  Psychiatric: She has a normal mood and affect. Her behavior is normal.  Nursing note and vitals reviewed.    ED Treatments / Results  Labs (all  labs ordered are listed, but only abnormal results are displayed) Labs Reviewed  COMPREHENSIVE METABOLIC PANEL - Abnormal; Notable for the following components:      Result Value   Glucose, Bld 111 (*)    All other components within normal limits  CBC - Abnormal; Notable for the following components:   WBC 11.6 (*)    Platelets 449 (*)    All other components within normal limits  URINALYSIS, ROUTINE W REFLEX MICROSCOPIC - Abnormal; Notable for the following components:   Protein, ur 30 (*)    All other components within normal limits  LIPASE, BLOOD  I-STAT BETA HCG BLOOD, ED ([facility], [facility], [facility] ONLY)  I-STAT TROPONIN, ED  I-STAT BETA HCG BLOOD, ED (NOT ORDERABLE)  POCT I-STAT TROPONIN I    EKG None  Radiology Dg Chest 2 View  Result Date: 07/26/2018 CLINICAL DATA:  Chest pain EXAM: CHEST - 2 VIEW COMPARISON:  None. FINDINGS: There is no edema or consolidation. The heart size and pulmonary vascularity are normal. No adenopathy. There is mid and lower thoracic levoscoliosis. No pneumothorax. IMPRESSION: No edema or consolidation. Electronically Signed   By: Lowella Grip III M.D.   On: 07/26/2018 20:49    Procedures Procedures (including  critical care time)  Medications Ordered in ED Medications  sodium chloride 0.9 % bolus 1,000 mL (0 mLs Intravenous Stopped 07/27/18 0222)  acetaminophen (TYLENOL) tablet 1,000 mg (1,000 mg Oral Given 07/27/18 0032)     Initial Impression / Assessment and Plan / ED Course  I have reviewed the triage vital signs and the nursing notes.  Pertinent labs & imaging results that were available during my care of the patient were reviewed by me and considered in my medical decision making (see chart for details).     57 yo F with multiple complaints.  Hard to pinpoint a definite source of all her symptoms.  Her abdominal pain is been going on for about 4 days and has improved.  Her exam is very benign for me she has no pain in the right upper quadrant negative Murphy sign no pain at McBurney's point.  She is having pain to the right lateral jaw, there is no lymphadenopathy no intraoral source what is visualized on the right TM is normal.  She has complaints of lightheadedness upon standing which most likely is dehydration based on her history, will give a fluid bolus.  She has documented vital signs of hypoxia and tachypnea though during my exam she had neither.  Patient feeling better post iv fluids. D/c home.   5:30 AM:  I have discussed the diagnosis/risks/treatment options with the patient and family and believe the pt to be eligible for discharge home to follow-up with PCP. We also discussed returning to the ED immediately if new or worsening sx occur. We discussed the sx which are most concerning (e.g., sudden worsening pain, fever, inability to tolerate by mouth) that necessitate immediate return. Medications administered to the patient during their visit and any new prescriptions provided to the patient are listed below.  Medications given during this visit Medications  sodium chloride 0.9 % bolus 1,000 mL (0 mLs Intravenous Stopped 07/27/18 0222)  acetaminophen (TYLENOL) tablet 1,000 mg (1,000  mg Oral Given 07/27/18 0032)      The patient appears reasonably screen and/or stabilized for discharge and I doubt any other medical condition or other [facility] requiring further screening, evaluation, or treatment in the ED at this time prior to discharge.    Final  Clinical Impressions(s) / ED Diagnoses   Final diagnoses:  Generalized abdominal pain  Fever, unspecified fever cause  Jaw pain    ED Discharge Orders    None       Deno Etienne, DO 07/27/18 0530

## 2018-07-26 NOTE — ED Triage Notes (Signed)
Patient is complaining of abdominal pain that is radiating to her chest. Patient states the baking soda with water makes the pain better.

## 2018-07-27 NOTE — Discharge Instructions (Signed)
Take 4 over the counter ibuprofen tablets 3 times a day or 2 over-the-counter naproxen tablets twice a day for pain. Also take tylenol 1000mg(2 extra strength) four times a day.    

## 2018-07-29 NOTE — Telephone Encounter (Signed)
Pt informed of below. Zimmerman Rumple, Tramayne Sebesta D, CMA  

## 2018-07-29 NOTE — Telephone Encounter (Signed)
Patient requests ortho referral for bilateral hip pain, placed referral.

## 2018-07-30 ENCOUNTER — Ambulatory Visit (INDEPENDENT_AMBULATORY_CARE_PROVIDER_SITE_OTHER): Payer: Medicaid Other | Admitting: Psychiatry

## 2018-07-30 ENCOUNTER — Telehealth: Payer: Self-pay | Admitting: Family Medicine

## 2018-07-30 ENCOUNTER — Encounter (HOSPITAL_COMMUNITY): Payer: Self-pay | Admitting: Psychiatry

## 2018-07-30 VITALS — BP 108/64 | HR 80 | Ht 62.0 in | Wt 129.0 lb

## 2018-07-30 DIAGNOSIS — F410 Panic disorder [episodic paroxysmal anxiety] without agoraphobia: Secondary | ICD-10-CM

## 2018-07-30 DIAGNOSIS — F4310 Post-traumatic stress disorder, unspecified: Secondary | ICD-10-CM | POA: Diagnosis not present

## 2018-07-30 DIAGNOSIS — F3341 Major depressive disorder, recurrent, in partial remission: Secondary | ICD-10-CM

## 2018-07-30 DIAGNOSIS — Z7689 Persons encountering health services in other specified circumstances: Secondary | ICD-10-CM | POA: Diagnosis not present

## 2018-07-30 DIAGNOSIS — M545 Low back pain: Principal | ICD-10-CM

## 2018-07-30 DIAGNOSIS — F5104 Psychophysiologic insomnia: Secondary | ICD-10-CM

## 2018-07-30 DIAGNOSIS — G8929 Other chronic pain: Secondary | ICD-10-CM

## 2018-07-30 MED ORDER — TRAZODONE HCL 50 MG PO TABS: 50.0000 mg | ORAL_TABLET | Freq: Every evening | ORAL | 0 refills | Status: DC | PRN

## 2018-07-30 MED ORDER — PAROXETINE HCL 20 MG PO TABS: 20.0000 mg | ORAL_TABLET | Freq: Every day | ORAL | 0 refills | Status: DC
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
## 2018-07-30 NOTE — Telephone Encounter (Signed)
Pt called and said she was reffered to an orthopedic and they called today to schedule and appointment for her hip. She said she asked about being seen for her back as well and the office told her that Dr. Lindell Noe would need to send another referral saying she needed to be seen for her back as well. Pt would like to know if Dr. Lindell Noe could send that referral.

## 2018-07-30 NOTE — Telephone Encounter (Signed)
Added a second referral.

## 2018-08-02 ENCOUNTER — Other Ambulatory Visit: Payer: Self-pay

## 2018-08-02 ENCOUNTER — Ambulatory Visit (INDEPENDENT_AMBULATORY_CARE_PROVIDER_SITE_OTHER): Payer: Medicaid Other | Admitting: Family Medicine

## 2018-08-02 ENCOUNTER — Encounter: Payer: Self-pay | Admitting: Family Medicine

## 2018-08-02 VITALS — BP 100/65 | HR 95 | Temp 98.6°F | Wt 128.0 lb

## 2018-08-02 DIAGNOSIS — M2669 Other specified disorders of temporomandibular joint: Secondary | ICD-10-CM

## 2018-08-02 DIAGNOSIS — Z7689 Persons encountering health services in other specified circumstances: Secondary | ICD-10-CM | POA: Diagnosis not present

## 2018-08-02 DIAGNOSIS — M26609 Unspecified temporomandibular joint disorder, unspecified side: Secondary | ICD-10-CM

## 2018-08-02 NOTE — Patient Instructions (Addendum)
Ask your dentist about a bite guard.  Temporomandibular Joint Syndrome Temporomandibular joint (TMJ) syndrome is a condition that affects the joints between your jaw and your skull. The TMJs are located near your ears and allow your jaw to open and close. These joints and the nearby muscles are involved in all movements of the jaw. People with TMJ syndrome have pain in the area of these joints and muscles. Chewing, biting, or other movements of the jaw can be difficult or painful. TMJ syndrome can be caused by various things. In many cases, the condition is mild and goes away within a few weeks. For some people, the condition can become a long-term problem. What are the causes? Possible causes of TMJ syndrome include:  Grinding your teeth or clenching your jaw. Some people do this when they are under stress.  Arthritis.  Injury to the jaw.  Head or neck injury.  Teeth or dentures that are not aligned well.  In some cases, the cause of TMJ syndrome may not be known. What are the signs or symptoms? The most common symptom is an aching pain on the side of the head in the area of the TMJ. Other symptoms may include:  Pain when moving your jaw, such as when chewing or biting.  Being unable to open your jaw all the way.  Making a clicking sound when you open your mouth.  Headache.  Earache.  Neck or shoulder pain.  How is this diagnosed? Diagnosis can usually be made based on your symptoms, your medical history, and a physical exam. Your health care provider may check the range of motion of your jaw. Imaging tests, such as X-rays or an MRI, are sometimes done. You may need to see your dentist to determine if your teeth and jaw are lined up correctly. How is this treated? TMJ syndrome often goes away on its own. If treatment is needed, the options may include:  Eating soft foods and applying ice or heat.  Medicines to relieve pain or inflammation.  Medicines to relax the  muscles.  A splint, bite plate, or mouthpiece to prevent teeth grinding or jaw clenching.  Relaxation techniques or counseling to help reduce stress.  Transcutaneous electrical nerve stimulation (TENS). This helps to relieve pain by applying an electrical current through the skin.  Acupuncture. This is sometimes helpful to relieve pain.  Jaw surgery. This is rarely needed.  Follow these instructions at home:  Take medicines only as directed by your health care provider.  Eat a soft diet if you are having trouble chewing.  Apply ice to the painful area. ? Put ice in a plastic bag. ? Place a towel between your skin and the bag. ? Leave the ice on for 20 minutes, 2-3 times a day.  Apply a warm compress to the painful area as directed.  Massage your jaw area and perform any jaw stretching exercises as recommended by your health care provider.  If you were given a mouthpiece or bite plate, wear it as directed.  Avoid foods that require a lot of chewing. Do not chew gum.  Keep all follow-up visits as directed by your health care provider. This is important. Contact a health care provider if:  You are having trouble eating.  You have new or worsening symptoms. Get help right away if:  Your jaw locks open or closed. This information is not intended to replace advice given to you by your health care provider. Make sure you discuss any questions  you have with your health care provider. Document Released: 08/22/2001 Document Revised: 07/27/2016 Document Reviewed: 07/02/2014 Elsevier Interactive Patient Education  [employer].

## 2018-08-02 NOTE — Progress Notes (Signed)
    Subjective:  Danielle Villa is a 57 y.o. female who presents to the [facility] today with a chief complaint of right ear pain.   HPI:  Over the last couple of weeks has started to notice some sharp right ear pain.  No hearing loss.  May be some slight ringing in her ears.  No ear drainage. No tooth problems, saw her dentist recently. Has not been sick recently. She does not know if she grinds her teeth at night  ROS: Per HPI  Objective:  Physical Exam: BP 100/65   Pulse 95   Temp 98.6 F (37 C) (Oral)   Wt 128 lb (58.1 kg)   SpO2 99%   BMI 23.41 kg/m   Gen: NAD, resting comfortably HEENT: [place], AT.  Tympanic membranes with good light reflex bilaterally, ear canals healthy and normal.  Oropharynx nonerythematous.  Good dentition.  Subluxation of jaw with clicking at R temporomandibular joint with opening and closing of mouth. Pulm: NWOB on room air Skin: warm, dry Neuro: grossly normal, moves all extremities Psych: Normal affect and thought content    Assessment/Plan:  TMJ (temporomandibular joint disorder) Patient has subluxation of jaw and pain at right TMJ which is the cause of her ear pain.  She has no red flags and her hearing is intact.  Advised patient to discuss mouthguard with her dentist   Bufford Lope, DO PGY-3, [facility] 08/02/2018 4:15 PM

## 2018-08-03 DIAGNOSIS — M26609 Unspecified temporomandibular joint disorder, unspecified side: Secondary | ICD-10-CM | POA: Insufficient documentation

## 2018-08-03 NOTE — Assessment & Plan Note (Signed)
Patient has subluxation of jaw and pain at right TMJ which is the cause of her ear pain.  She has no red flags and her hearing is intact.  Advised patient to discuss mouthguard with her dentist

## 2018-08-04 ENCOUNTER — Emergency Department (HOSPITAL_COMMUNITY)
Admission: EM | Admit: 2018-08-04 | Discharge: 2018-08-04 | Disposition: A | Discharge status: Home / Self Care | Payer: Medicaid Other | Attending: Emergency Medicine | Admitting: Emergency Medicine

## 2018-08-04 ENCOUNTER — Emergency Department (HOSPITAL_COMMUNITY): Payer: Medicaid Other

## 2018-08-04 ENCOUNTER — Encounter (HOSPITAL_COMMUNITY): Payer: Self-pay | Admitting: Emergency Medicine

## 2018-08-04 ENCOUNTER — Other Ambulatory Visit: Payer: Self-pay

## 2018-08-04 DIAGNOSIS — R079 Chest pain, unspecified: Secondary | ICD-10-CM | POA: Diagnosis not present

## 2018-08-04 DIAGNOSIS — I10 Essential (primary) hypertension: Secondary | ICD-10-CM | POA: Diagnosis not present

## 2018-08-04 DIAGNOSIS — R1111 Vomiting without nausea: Secondary | ICD-10-CM | POA: Diagnosis not present

## 2018-08-04 DIAGNOSIS — Z7982 Long term (current) use of aspirin: Secondary | ICD-10-CM | POA: Diagnosis not present

## 2018-08-04 DIAGNOSIS — R110 Nausea: Secondary | ICD-10-CM | POA: Diagnosis not present

## 2018-08-04 DIAGNOSIS — R1013 Epigastric pain: Secondary | ICD-10-CM | POA: Diagnosis not present

## 2018-08-04 DIAGNOSIS — Z79899 Other long term (current) drug therapy: Secondary | ICD-10-CM | POA: Insufficient documentation

## 2018-08-04 DIAGNOSIS — K2100 Gastro-esophageal reflux disease with esophagitis, without bleeding: Secondary | ICD-10-CM

## 2018-08-04 DIAGNOSIS — R0789 Other chest pain: Secondary | ICD-10-CM | POA: Diagnosis not present

## 2018-08-04 DIAGNOSIS — K219 Gastro-esophageal reflux disease without esophagitis: Secondary | ICD-10-CM | POA: Diagnosis not present

## 2018-08-04 LAB — CBC
HEMATOCRIT: 39.9 % (ref 36.0–46.0)
HEMOGLOBIN: 12.5 g/dL (ref 12.0–15.0)
MCH: 27.7 pg (ref 26.0–34.0)
MCHC: 31.3 g/dL (ref 30.0–36.0)
MCV: 88.3 fL (ref 78.0–100.0)
Platelets: 454 10*3/uL — ABNORMAL HIGH (ref 150–400)
RBC: 4.52 MIL/uL (ref 3.87–5.11)
RDW: 14.1 % (ref 11.5–15.5)
WBC: 13.9 10*3/uL — ABNORMAL HIGH (ref 4.0–10.5)

## 2018-08-04 LAB — COMPREHENSIVE METABOLIC PANEL
ALK PHOS: 78 U/L (ref 38–126)
ALT: 12 U/L (ref 0–44)
ANION GAP: 14 (ref 5–15)
AST: 21 U/L (ref 15–41)
Albumin: 3.6 g/dL (ref 3.5–5.0)
BILIRUBIN TOTAL: 0.6 mg/dL (ref 0.3–1.2)
BUN: 7 mg/dL (ref 6–20)
CALCIUM: 9.3 mg/dL (ref 8.9–10.3)
CO2: 20 mmol/L — ABNORMAL LOW (ref 22–32)
Chloride: 107 mmol/L (ref 98–111)
Creatinine, Ser: 0.68 mg/dL (ref 0.44–1.00)
GLUCOSE: 146 mg/dL — AB (ref 70–99)
POTASSIUM: 3.7 mmol/L (ref 3.5–5.1)
Sodium: 141 mmol/L (ref 135–145)
TOTAL PROTEIN: 7.6 g/dL (ref 6.5–8.1)

## 2018-08-04 LAB — LIPASE, BLOOD: LIPASE: 25 U/L (ref 11–51)

## 2018-08-04 LAB — I-STAT TROPONIN, ED
TROPONIN I, POC: 0.02 ng/mL (ref 0.00–0.08)
TROPONIN I, POC: 0.05 ng/mL (ref 0.00–0.08)

## 2018-08-04 LAB — I-STAT BETA HCG BLOOD, ED (MC, WL, AP ONLY): I-stat hCG, quantitative: <5 m[IU]/mL (ref <5)

## 2018-08-04 MED ORDER — GI COCKTAIL ~~LOC~~
30.0000 mL | Freq: Once | ORAL | Status: AC
  Administered 2018-08-04: 30 mL via ORAL
  Filled 2018-08-04: qty 30

## 2018-08-04 MED ORDER — SUCRALFATE 1 G PO TABS: 1.0000 g | ORAL_TABLET | Freq: Four times a day (QID) | ORAL | 0 refills | Status: DC

## 2018-08-04 MED ORDER — DIPHENHYDRAMINE HCL 50 MG/ML IJ SOLN
12.5000 mg | Freq: Once | INTRAMUSCULAR | Status: AC
  Administered 2018-08-04: 12.5 mg via INTRAVENOUS
  Filled 2018-08-04: qty 1

## 2018-08-04 MED ORDER — METOCLOPRAMIDE HCL 5 MG/ML IJ SOLN
5.0000 mg | Freq: Once | INTRAMUSCULAR | Status: AC
  Administered 2018-08-04: 5 mg via INTRAVENOUS
  Filled 2018-08-04: qty 2

## 2018-08-04 MED ORDER — SUCRALFATE 1 G PO TABS
1.0000 g | ORAL_TABLET | Freq: Once | ORAL | Status: AC
  Administered 2018-08-04: 1 g via ORAL
  Filled 2018-08-04: qty 1

## 2018-08-04 MED ORDER — FAMOTIDINE 20 MG PO TABS
40.0000 mg | ORAL_TABLET | Freq: Once | ORAL | Status: AC
  Administered 2018-08-04: 40 mg via ORAL
  Filled 2018-08-04: qty 2

## 2018-08-04 MED ORDER — PANTOPRAZOLE SODIUM 20 MG PO TBEC: 20.0000 mg | DELAYED_RELEASE_TABLET | Freq: Two times a day (BID) | ORAL | 0 refills | Status: DC

## 2018-08-04 NOTE — ED Notes (Signed)
Discharge instructions and prescriptions discussed with Pt. Pt verbalized understanding. Pt stable and ambulatory.   

## 2018-08-04 NOTE — ED Notes (Signed)
Pt ambulated self efficiently to restroom with no difficulty. Pt returned safely to bedside. 

## 2018-08-04 NOTE — ED Notes (Signed)
MD Zenia Resides notified about systolic bp in the 83'E.  Per MD walk pt around unit and do orthostatic VS.  Will continue to monitor.

## 2018-08-04 NOTE — ED Provider Notes (Signed)
[facility] EMERGENCY DEPARTMENT Provider Note   CSN: [id number] Arrival date & time: 08/04/18  1525     History   Chief Complaint Chief Complaint  Patient presents with  . Chest Pain  . Abdominal Pain    HPI Danielle Villa is a 57 y.o. female.  57 year old female presents with epigastric abdominal pain radiating to her chest after drinking vinegar and water.  She is doing this and attempt to lose weight.  Denies any anginal type quality to her symptoms.  Has not had any belching.  Has had some diarrhea.  Pain does radiate to her shoulder blade.  Called EMS was given 3 nitro and aspirin and morphine without relief.  Denies any fever or chills or cough or congestion.     Past Medical History:  Diagnosis Date  . Back pain     Patient Active Problem List   Diagnosis Date Noted  . TMJ (temporomandibular joint disorder) 08/03/2018  . Ringworm 05/17/2018  . OA (osteoarthritis) of hip 03/15/2018  . Sleep-wake cycle disorder 03/15/2018  . Trochanteric bursitis of both hips 12/19/2017  . Abdominal pain, left lower quadrant 11/28/2017  . Encounter for disability assessment 11/13/2017  . Chronic hand pain, right 10/16/2017  . Hypertrophic lichen planus 89/37/3428  . Mood disturbance 09/07/2017  . Left knee pain 07/04/2017  . Constipation 05/17/2017  . POLYURIA 12/29/2009  . ONYCHOMYCOSIS, TOENAILS 02/01/2009  . FIBROIDS, UTERUS 04/29/2008  . MENORRHAGIA 03/06/2008  . HYPERCHOLESTEROLEMIA 02/07/2007  . PANIC ATTACKS 02/07/2007  . BACK PAIN, LOW 02/07/2007  . INCONTINENCE, FECAL 02/07/2007    History reviewed. No pertinent surgical history.   OB History    Gravida  5   Para  4   Term  4   Preterm      AB      Living  4     SAB      TAB      Ectopic      Multiple      Live Births  4            Home Medications    Prior to Admission medications   Medication Sig Start Date End Date Taking? Authorizing Provider  aspirin  (ASPIRIN EC) 81 MG EC tablet Take 1 tablet (81 mg total) by mouth daily. 12/31/17   Lovenia Kim, MD  atorvastatin (LIPITOR) 20 MG tablet TAKE 1 TABLET BY MOUTH ONCE DAILY Patient taking differently: Take 20 mg by mouth every evening.  07/11/18   Sela Hilding, MD  Calcium Carbonate-Vitamin D (CALTRATE 600+D) 600-400 MG-UNIT tablet Take 1 tablet by mouth daily. 11/06/17   Lovenia Kim, MD  Melatonin 2.5 MG CAPS Take 1 capsule (2.5 mg total) by mouth at bedtime. 03/13/18   Sela Hilding, MD  PARoxetine (PAXIL) 20 MG tablet Take 1 tablet (20 mg total) by mouth daily. 07/30/18   Lavella Hammock, MD  traMADol (ULTRAM) 50 MG tablet Take 1 tablet (50 mg total) by mouth every 6 (six) hours as needed. 03/13/18   Sela Hilding, MD  traZODone (DESYREL) 50 MG tablet Take 1 tablet (50 mg total) by mouth at bedtime as needed. for sleep 07/30/18   Lavella Hammock, MD    Family History Family History  Problem Relation Age of Onset  . Hypertension Mother   . Diabetes Mother   . Hypertension Maternal Grandmother     Social History Social History   Tobacco Use  . Smoking status: Never Smoker  .  Smokeless tobacco: Never Used  Substance Use Topics  . Alcohol use: Yes    Comment: occasionaly  . Drug use: No     Allergies   Patient has no known allergies.   Review of Systems Review of Systems  All other systems reviewed and are negative.    Physical Exam Updated Vital Signs BP (!) 142/79   Pulse 87   Temp 98.6 F (37 C) (Oral)   Resp 17   Ht 1.575 m (5\' 2" )   Wt 58.1 kg   SpO2 100%   BMI 23.41 kg/m   Physical Exam  Constitutional: She is oriented to person, place, and time. She appears well-developed and well-nourished.  Non-toxic appearance. No distress.  HENT:  Head: Normocephalic and atraumatic.  Eyes: Pupils are equal, round, and reactive to light. Conjunctivae, EOM and lids are normal.  Neck: Normal range of motion. Neck supple. No tracheal deviation present. No  thyroid mass present.  Cardiovascular: Normal rate, regular rhythm and normal heart sounds. Exam reveals no gallop.  No murmur heard. Pulmonary/Chest: Effort normal and breath sounds normal. No stridor. No respiratory distress. She has no decreased breath sounds. She has no wheezes. She has no rhonchi. She has no rales.  Abdominal: Soft. Normal appearance and bowel sounds are normal. She exhibits no distension and no fluid wave. There is tenderness in the epigastric area. There is no rebound and no CVA tenderness.    Musculoskeletal: Normal range of motion. She exhibits no edema or tenderness.  Neurological: She is alert and oriented to person, place, and time. She has normal strength. No cranial nerve deficit or sensory deficit. GCS eye subscore is 4. GCS verbal subscore is 5. GCS motor subscore is 6.  Skin: Skin is warm and dry. No abrasion and no rash noted.  Psychiatric: She has a normal mood and affect. Her speech is normal and behavior is normal.  Nursing note and vitals reviewed.    ED Treatments / Results  Labs (all labs ordered are listed, but only abnormal results are displayed) Labs Reviewed  CBC  COMPREHENSIVE METABOLIC PANEL  LIPASE, BLOOD  I-STAT TROPONIN, ED  I-STAT BETA HCG BLOOD, ED ([facility], [facility], [facility] ONLY)    EKG EKG Interpretation  Date/Time:  Sunday August 04 2018 15:34:55 EDT Ventricular Rate:  66 PR Interval:    QRS Duration: 92 QT Interval:  433 QTC Calculation: 709 R Axis:   41 Text Interpretation:  Sinus rhythm Confirmed by Lacretia Leigh ([id number]) on 08/04/2018 6:04:01 PM   Radiology No results found.  Procedures Procedures (including critical care time)  Medications Ordered in ED Medications  gi cocktail (Maalox,Lidocaine,Donnatal) (has no administration in time range)  famotidine (PEPCID) tablet 40 mg (has no administration in time range)  sucralfate (CARAFATE) tablet 1 g (has no administration in time range)     Initial Impression / Assessment  and Plan / ED Course  I have reviewed the triage vital signs and the nursing notes.  Pertinent labs & imaging results that were available during my care of the patient were reviewed by me and considered in my medical decision making (see chart for details).     With suspected GERD symptoms.  Given GI cocktail and Pepcid and feels better.  Cardiac markers x2-.  Patient able to eat here and a blood around the department.  Transient decrease in blood pressure was resolved.  Stable for discharge Final Clinical Impressions(s) / ED Diagnoses   Final diagnoses:  None    ED  Discharge Orders    None       Lacretia Leigh, MD 08/04/18 [phone]

## 2018-08-04 NOTE — ED Notes (Signed)
Pt ambulatory around unit, states she feels a little lightheaded but otherwise okay.

## 2018-08-04 NOTE — ED Notes (Signed)
Pt given meal with Sprite.

## 2018-08-04 NOTE — ED Triage Notes (Addendum)
Pt c/o sharp right shoulder blade radiating CP and abd pain.Has had diarrhea a few times this morning. 3 nitro and 324 ASA, 4 mg morphine with no relief. A&O x4.  VS EMS P 65,  SPo2 100% RA, BP 127/79, RR 20.

## 2018-08-06 ENCOUNTER — Encounter (INDEPENDENT_AMBULATORY_CARE_PROVIDER_SITE_OTHER): Payer: Self-pay | Admitting: Orthopaedic Surgery

## 2018-08-06 ENCOUNTER — Ambulatory Visit (INDEPENDENT_AMBULATORY_CARE_PROVIDER_SITE_OTHER): Payer: Medicaid Other | Admitting: Orthopaedic Surgery

## 2018-08-06 DIAGNOSIS — M25551 Pain in right hip: Secondary | ICD-10-CM

## 2018-08-06 DIAGNOSIS — M25552 Pain in left hip: Secondary | ICD-10-CM

## 2018-08-06 DIAGNOSIS — G8929 Other chronic pain: Secondary | ICD-10-CM | POA: Diagnosis not present

## 2018-08-06 DIAGNOSIS — M545 Low back pain: Secondary | ICD-10-CM

## 2018-08-06 DIAGNOSIS — F4310 Post-traumatic stress disorder, unspecified: Secondary | ICD-10-CM | POA: Diagnosis not present

## 2018-08-06 DIAGNOSIS — Z7689 Persons encountering health services in other specified circumstances: Secondary | ICD-10-CM | POA: Diagnosis not present

## 2018-08-06 NOTE — Progress Notes (Signed)
Office Visit Note   Patient: Danielle Villa           Date of Birth: [birth year]           MRN: [id number] Visit Date: 08/06/2018              Requested by: Sela Hilding, MD [address] [place], [place] [address] PCP: Sela Hilding, MD   Assessment & Plan: Visit Diagnoses:  1. Pain of both hip joints   2. Chronic midline low back pain without sciatica     Plan: Impression is bilateral hip trochanteric bursitis with questionable lumbar pathology.  We will proceed with bilateral trochanteric bursa cortisone injections today.  I will also send her to formal physical therapy to work on iliotibial band stretching as well as core strengthening.  She will follow-up with [place] in 6 weeks time for recheck.  If she gets minimal to no relief from the trochanteric bursa injections, we will look further into her back.  Call with concerns or questions in the meantime.  Follow-Up Instructions: Return in about 6 weeks (around 09/17/2018).   Orders:  Orders Placed This Encounter  Procedures  . Large Joint Inj: bilateral greater trochanter   No orders of the defined types were placed in this encounter.     Procedures: Large Joint Inj: bilateral greater trochanter on 08/06/2018 3:21 PM Indications: pain Details: 22 G needle, lateral approach      Clinical Data: No additional findings.   Subjective: Chief Complaint  Patient presents with  . Right Hip - Pain  . Left Hip - Pain    HPI patient is a pleasant 57 year old female who presents to our clinic to have bilateral hip pain right greater than left.  This is been ongoing for the better part of the past 2 years and has progressively worsened.  The pain she has is to the lateral aspect of both sides.  She also notes occasional pain to the middle of her lower back.  She denies any pain to the anterior thigh, groin or down either leg.  Pain is worse going up and down stairs, walking or sleeping on either side.  She is tried  over-the-counter medications as well as pain medicine provided to her by her primary care provider all without relief of symptoms.  She does note occasional numbness, tingling and burning down both lower extremities.  She has recently tried dry needling as well as formal physical therapy with minimal relief.  She does note a history of lumbar pathology when she lived in [place] several years back where she had multiple ESI injections.  These helped, but only lasted 3 weeks at most.  She has recently had x-rays of both hips which revealed minimal degenerative changes.  Review of Systems as detailed in HPI.  All others reviewed and are negative.   Objective: Vital Signs: There were no vitals taken for this visit.  Physical Exam well-developed well-nourished female in no acute distress.  Alert and oriented x3.  Ortho Exam examination of both hips reveals marked tenderness trochanteric bursa.  Negative logroll.  Positive straight leg raise.  Negative Fater.  She is neurovascularly intact distally.  Specialty Comments:  No specialty comments available.  Imaging: No new imaging   PMFS History: Patient Active Problem List   Diagnosis Date Noted  . Pain of both hip joints 08/06/2018  . TMJ (temporomandibular joint disorder) 08/03/2018  . Ringworm 05/17/2018  . OA (osteoarthritis) of hip 03/15/2018  . Sleep-wake  cycle disorder 03/15/2018  . Trochanteric bursitis of both hips 12/19/2017  . Abdominal pain, left lower quadrant 11/28/2017  . Encounter for disability assessment 11/13/2017  . Chronic hand pain, right 10/16/2017  . Hypertrophic lichen planus 50/72/2575  . Mood disturbance 09/07/2017  . Left knee pain 07/04/2017  . Constipation 05/17/2017  . POLYURIA 12/29/2009  . ONYCHOMYCOSIS, TOENAILS 02/01/2009  . FIBROIDS, UTERUS 04/29/2008  . MENORRHAGIA 03/06/2008  . HYPERCHOLESTEROLEMIA 02/07/2007  . PANIC ATTACKS 02/07/2007  . BACK PAIN, LOW 02/07/2007  . INCONTINENCE, FECAL  02/07/2007   Past Medical History:  Diagnosis Date  . Back pain     Family History  Problem Relation Age of Onset  . Hypertension Mother   . Diabetes Mother   . Hypertension Maternal Grandmother     History reviewed. No pertinent surgical history. Social History   Occupational History  . Not on file  Tobacco Use  . Smoking status: Never Smoker  . Smokeless tobacco: Never Used  Substance and Sexual Activity  . Alcohol use: Yes    Comment: occasionaly  . Drug use: No  . Sexual activity: Not Currently

## 2018-08-14 DIAGNOSIS — Z7689 Persons encountering health services in other specified circumstances: Secondary | ICD-10-CM | POA: Diagnosis not present

## 2018-08-16 ENCOUNTER — Encounter (HOSPITAL_COMMUNITY): Payer: Self-pay | Admitting: Emergency Medicine

## 2018-08-16 ENCOUNTER — Emergency Department (HOSPITAL_COMMUNITY)
Admission: EM | Admit: 2018-08-16 | Discharge: 2018-08-17 | Disposition: A | Discharge status: Home / Self Care | Payer: Medicaid Other | Attending: Emergency Medicine | Admitting: Emergency Medicine

## 2018-08-16 ENCOUNTER — Emergency Department (HOSPITAL_COMMUNITY): Payer: Medicaid Other

## 2018-08-16 DIAGNOSIS — Z7982 Long term (current) use of aspirin: Secondary | ICD-10-CM | POA: Insufficient documentation

## 2018-08-16 DIAGNOSIS — I10 Essential (primary) hypertension: Secondary | ICD-10-CM | POA: Diagnosis not present

## 2018-08-16 DIAGNOSIS — M5414 Radiculopathy, thoracic region: Secondary | ICD-10-CM | POA: Diagnosis not present

## 2018-08-16 DIAGNOSIS — M9903 Segmental and somatic dysfunction of lumbar region: Secondary | ICD-10-CM | POA: Diagnosis not present

## 2018-08-16 DIAGNOSIS — M5137 Other intervertebral disc degeneration, lumbosacral region: Secondary | ICD-10-CM | POA: Diagnosis not present

## 2018-08-16 DIAGNOSIS — R0789 Other chest pain: Secondary | ICD-10-CM | POA: Diagnosis not present

## 2018-08-16 DIAGNOSIS — R1013 Epigastric pain: Secondary | ICD-10-CM

## 2018-08-16 DIAGNOSIS — R079 Chest pain, unspecified: Secondary | ICD-10-CM | POA: Diagnosis not present

## 2018-08-16 DIAGNOSIS — M9902 Segmental and somatic dysfunction of thoracic region: Secondary | ICD-10-CM | POA: Diagnosis not present

## 2018-08-16 DIAGNOSIS — Z79899 Other long term (current) drug therapy: Secondary | ICD-10-CM | POA: Insufficient documentation

## 2018-08-16 DIAGNOSIS — R1084 Generalized abdominal pain: Secondary | ICD-10-CM | POA: Diagnosis not present

## 2018-08-16 DIAGNOSIS — R110 Nausea: Secondary | ICD-10-CM | POA: Diagnosis not present

## 2018-08-16 DIAGNOSIS — S134XXA Sprain of ligaments of cervical spine, initial encounter: Secondary | ICD-10-CM | POA: Diagnosis not present

## 2018-08-16 DIAGNOSIS — M9901 Segmental and somatic dysfunction of cervical region: Secondary | ICD-10-CM | POA: Diagnosis not present

## 2018-08-16 DIAGNOSIS — Z7689 Persons encountering health services in other specified circumstances: Secondary | ICD-10-CM | POA: Diagnosis not present

## 2018-08-16 LAB — CBC
HCT: 37.3 % (ref 36.0–46.0)
HEMOGLOBIN: 11.5 g/dL — AB (ref 12.0–15.0)
MCH: 27.1 pg (ref 26.0–34.0)
MCHC: 30.8 g/dL (ref 30.0–36.0)
MCV: 87.8 fL (ref 78.0–100.0)
PLATELETS: 416 10*3/uL — AB (ref 150–400)
RBC: 4.25 MIL/uL (ref 3.87–5.11)
RDW: 14.9 % (ref 11.5–15.5)
WBC: 13.2 10*3/uL — AB (ref 4.0–10.5)

## 2018-08-16 LAB — BASIC METABOLIC PANEL
ANION GAP: 16 — AB (ref 5–15)
BUN: 8 mg/dL (ref 6–20)
CHLORIDE: 100 mmol/L (ref 98–111)
CO2: 25 mmol/L (ref 22–32)
CREATININE: 0.64 mg/dL (ref 0.44–1.00)
Calcium: 9.5 mg/dL (ref 8.9–10.3)
GFR calc non Af Amer: >60 mL/min (ref >60)
Glucose, Bld: 137 mg/dL — ABNORMAL HIGH (ref 70–99)
Potassium: 3.2 mmol/L — ABNORMAL LOW (ref 3.5–5.1)
SODIUM: 141 mmol/L (ref 135–145)

## 2018-08-16 LAB — I-STAT BETA HCG BLOOD, ED (MC, WL, AP ONLY)

## 2018-08-16 LAB — I-STAT TROPONIN, ED: TROPONIN I, POC: 0.07 ng/mL (ref 0.00–0.08)

## 2018-08-16 NOTE — ED Provider Notes (Signed)
Patient placed in Quick Look pathway, seen and evaluated   Chief Complaint: Nausea, chest pain  HPI:   Patient presents today for evalaution of chest pain.  Chest pain is sharp, central.  She has nausea, no vomiting or shortness of breath.  She had abdominal pain earlier but that resolved.   ROS: No cough Physical Exam:   Gen: No distress  Neuro: Awake and Alert  Skin: Warm    Focused Exam: Able to speak in full and clear sentences.    Initiation of care has begun. The patient has been counseled on the process, plan, and necessity for staying for the completion/evaluation, and the remainder of the medical screening examination    Ollen Gross 08/16/18 2034    Pattricia Boss, MD 08/17/18 2106

## 2018-08-16 NOTE — ED Triage Notes (Signed)
Pt presents with CP that woke her from sleep at 11am ; is sharp and central 10/10 pain; pt given 324mg  ASA and 2 SL NTG for pain without relief and also c/o abd pain that improved after she went to the restroom; some nausea, EMS gave 4mg  zofran

## 2018-08-17 LAB — TROPONIN I: Troponin I: <0.03 ng/mL (ref <0.03)

## 2018-08-17 NOTE — ED Provider Notes (Signed)
[facility] EMERGENCY DEPARTMENT Provider Note   CSN: [id number] Arrival date & time: 08/16/18  2029     History   Chief Complaint Chief Complaint  Patient presents with  . Chest Pain  . Abdominal Pain    HPI Danielle Villa is a 57 y.o. female.  Patient returns to the ED with abdominal and chest pain (epigastric, low central chest) that started yesterday morning and woke her from sleep at around 11:00 am. She has had similar symptoms previously and was most recently prescribed protonix and carafate. She has been taking those medications since 08/04/18 when last seen in the ED. She denies cough or fever. No vomiting. She has had nausea without vomiting. She states she feels it might be related to not taking her chronic anxiety medication (benzo) in 4 days and thinks it may be withdrawal. No diarrhea.   The history is provided by the patient. No language interpreter was used.    Past Medical History:  Diagnosis Date  . Back pain     Patient Active Problem List   Diagnosis Date Noted  . Pain of both hip joints 08/06/2018  . TMJ (temporomandibular joint disorder) 08/03/2018  . Ringworm 05/17/2018  . OA (osteoarthritis) of hip 03/15/2018  . Sleep-wake cycle disorder 03/15/2018  . Trochanteric bursitis of both hips 12/19/2017  . Abdominal pain, left lower quadrant 11/28/2017  . Encounter for disability assessment 11/13/2017  . Chronic hand pain, right 10/16/2017  . Hypertrophic lichen planus 36/14/4315  . Mood disturbance 09/07/2017  . Left knee pain 07/04/2017  . Constipation 05/17/2017  . POLYURIA 12/29/2009  . ONYCHOMYCOSIS, TOENAILS 02/01/2009  . FIBROIDS, UTERUS 04/29/2008  . MENORRHAGIA 03/06/2008  . HYPERCHOLESTEROLEMIA 02/07/2007  . PANIC ATTACKS 02/07/2007  . BACK PAIN, LOW 02/07/2007  . INCONTINENCE, FECAL 02/07/2007    History reviewed. No pertinent surgical history.   OB History    Gravida  5   Para  4   Term  4   Preterm        AB      Living  4     SAB      TAB      Ectopic      Multiple      Live Births  4            Home Medications    Prior to Admission medications   Medication Sig Start Date End Date Taking? Authorizing Provider  aspirin (ASPIRIN EC) 81 MG EC tablet Take 1 tablet (81 mg total) by mouth daily. 12/31/17   Lovenia Kim, MD  atorvastatin (LIPITOR) 20 MG tablet TAKE 1 TABLET BY MOUTH ONCE DAILY Patient taking differently: Take 20 mg by mouth every evening.  07/11/18   Sela Hilding, MD  Calcium Carbonate-Vitamin D (CALTRATE 600+D) 600-400 MG-UNIT tablet Take 1 tablet by mouth daily. 11/06/17   Lovenia Kim, MD  Melatonin 2.5 MG CAPS Take 1 capsule (2.5 mg total) by mouth at bedtime. 03/13/18   Sela Hilding, MD  pantoprazole (PROTONIX) 20 MG tablet Take 1 tablet (20 mg total) by mouth 2 (two) times daily. 08/04/18   Lacretia Leigh, MD  PARoxetine (PAXIL) 20 MG tablet Take 1 tablet (20 mg total) by mouth daily. 07/30/18   Lavella Hammock, MD  sucralfate (CARAFATE) 1 g tablet Take 1 tablet (1 g total) by mouth 4 (four) times daily. 08/04/18   Lacretia Leigh, MD  traMADol (ULTRAM) 50 MG tablet Take 1 tablet (50 mg total) by  mouth every 6 (six) hours as needed. 03/13/18   Sela Hilding, MD  traZODone (DESYREL) 50 MG tablet Take 1 tablet (50 mg total) by mouth at bedtime as needed. for sleep 07/30/18   Lavella Hammock, MD    Family History Family History  Problem Relation Age of Onset  . Hypertension Mother   . Diabetes Mother   . Hypertension Maternal Grandmother     Social History Social History   Tobacco Use  . Smoking status: Never Smoker  . Smokeless tobacco: Never Used  Substance Use Topics  . Alcohol use: Yes    Comment: occasionaly  . Drug use: No     Allergies   Patient has no known allergies.   Review of Systems Review of Systems  Constitutional: Negative for chills and fever.  HENT: Negative.   Respiratory: Negative.  Negative for cough  and shortness of breath.   Cardiovascular: Positive for chest pain.  Gastrointestinal: Positive for abdominal pain and nausea.  Musculoskeletal: Negative.   Skin: Negative.   Neurological: Negative.      Physical Exam Updated Vital Signs BP 113/75 (BP Location: Right Arm)   Pulse 79   Temp 98.6 F (37 C) (Oral)   Resp 16   SpO2 96%   Physical Exam  Constitutional: She is oriented to person, place, and time. She appears well-developed and well-nourished.  HENT:  Head: Normocephalic.  Neck: Normal range of motion. Neck supple.  Cardiovascular: Normal rate, regular rhythm and normal pulses.  Pulmonary/Chest: Effort normal and breath sounds normal. She has no wheezes. She has no rhonchi. She has no rales.  Abdominal: Soft. Bowel sounds are normal. There is no tenderness. There is no rebound and no guarding.  Musculoskeletal: Normal range of motion.       Right lower leg: She exhibits no edema.       Left lower leg: She exhibits no edema.  Neurological: She is alert and oriented to person, place, and time.  Skin: Skin is warm and dry. No rash noted.  Psychiatric: She has a normal mood and affect.  Nursing note and vitals reviewed.    ED Treatments / Results  Labs (all labs ordered are listed, but only abnormal results are displayed) Labs Reviewed  BASIC METABOLIC PANEL - Abnormal; Notable for the following components:      Result Value   Potassium 3.2 (*)    Glucose, Bld 137 (*)    Anion gap 16 (*)    All other components within normal limits  CBC - Abnormal; Notable for the following components:   WBC 13.2 (*)    Hemoglobin 11.5 (*)    Platelets 416 (*)    All other components within normal limits  TROPONIN I  I-STAT TROPONIN, ED  I-STAT BETA HCG BLOOD, ED ([facility], [facility], [facility] ONLY)    EKG EKG Interpretation  Date/Time:  Friday August 16 2018 20:41:36 EDT Ventricular Rate:  73 PR Interval:  152 QRS Duration: 80 QT Interval:  378 QTC Calculation: 416 R  Axis:   -11 Text Interpretation:  Normal sinus rhythm Cannot rule out Anterior infarct , age undetermined Confirmed by Dory Horn) on 08/17/2018 12:37:11 AM   Radiology Dg Chest 2 View  Result Date: 08/16/2018 CLINICAL DATA:  Chest pain since 11 a.m. Abdominal pain and some nausea. EXAM: CHEST - 2 VIEW COMPARISON:  08/04/2018 Normal heart size and pulmonary vascularity. No focal airspace disease or consolidation in the lungs. No blunting of costophrenic angles. No pneumothorax.  Mediastinal contours appear intact. FINDINGS: The heart size and mediastinal contours are within normal limits. Both lungs are clear. The visualized skeletal structures are unremarkable. IMPRESSION: No active cardiopulmonary disease. Electronically Signed   By: Lucienne Capers M.D.   On: 08/16/2018 21:08    Procedures Procedures (including critical care time)  Medications Ordered in ED Medications - No data to display   Initial Impression / Assessment and Plan / ED Course  I have reviewed the triage vital signs and the nursing notes.  Pertinent labs & imaging results that were available during my care of the patient were reviewed by me and considered in my medical decision making (see chart for details).     Patient here with recurrent abdominal pain associated with chest pain which are symptoms she has had on recent ED evaluation. She has not followed up yet with her primary care doctor. No vomiting, fever, cough, SOB.   On my evaluation, the patient is comfortable. No symptoms currently. Labs show a mild hypokalemia. She has a mild leukocytosis of 13.2. CXR clear. EKG without acute/ischemic changes.   DDx - benzo withdrawal vs GERD vs less likely ACS. Delta troponin is pending. If negative, ACS felt ruled out. VSS. Plan: if delta negative, can discharge home with GI referral for persistent symptoms. Doubt benzo withdrawal as her symptoms are similar to previous presentation not in the setting of missing  multiple days of her regular dose of benzos.   Repeat troponin is negative. The patient can be discharged home with referral to PCP as well as GI for further evaluation. Return precautions discussed.   Final Clinical Impressions(s) / ED Diagnoses   Final diagnoses:  None   1. Epigastric abdominal pain 2. Nonspecific chest pain  ED Discharge Orders    None       Charlann Lange, PA-C 08/17/18 [place], April, MD 08/17/18 3009

## 2018-08-17 NOTE — Discharge Instructions (Addendum)
Continue your current medications as prescribed. Follow up with your doctor for further outpatient evaluation of recurrent abdominal and chest pain. Return to the emergency department as needed.

## 2018-08-20 ENCOUNTER — Ambulatory Visit (INDEPENDENT_AMBULATORY_CARE_PROVIDER_SITE_OTHER): Payer: Medicaid Other | Admitting: Family Medicine

## 2018-08-20 ENCOUNTER — Other Ambulatory Visit: Payer: Self-pay

## 2018-08-20 ENCOUNTER — Emergency Department (HOSPITAL_COMMUNITY): Payer: Medicaid Other

## 2018-08-20 ENCOUNTER — Encounter (HOSPITAL_COMMUNITY): Payer: Self-pay | Admitting: Emergency Medicine

## 2018-08-20 ENCOUNTER — Emergency Department (HOSPITAL_COMMUNITY)
Admission: EM | Admit: 2018-08-20 | Discharge: 2018-08-20 | Disposition: A | Discharge status: Home / Self Care | Payer: Medicaid Other | Attending: Emergency Medicine | Admitting: Emergency Medicine

## 2018-08-20 DIAGNOSIS — R079 Chest pain, unspecified: Secondary | ICD-10-CM | POA: Diagnosis not present

## 2018-08-20 DIAGNOSIS — R0789 Other chest pain: Secondary | ICD-10-CM | POA: Diagnosis not present

## 2018-08-20 DIAGNOSIS — M25519 Pain in unspecified shoulder: Secondary | ICD-10-CM | POA: Diagnosis not present

## 2018-08-20 DIAGNOSIS — R1084 Generalized abdominal pain: Secondary | ICD-10-CM | POA: Diagnosis not present

## 2018-08-20 DIAGNOSIS — Z79899 Other long term (current) drug therapy: Secondary | ICD-10-CM | POA: Insufficient documentation

## 2018-08-20 DIAGNOSIS — Z7982 Long term (current) use of aspirin: Secondary | ICD-10-CM | POA: Diagnosis not present

## 2018-08-20 DIAGNOSIS — K4090 Unilateral inguinal hernia, without obstruction or gangrene, not specified as recurrent: Secondary | ICD-10-CM | POA: Diagnosis not present

## 2018-08-20 DIAGNOSIS — R1013 Epigastric pain: Secondary | ICD-10-CM | POA: Diagnosis not present

## 2018-08-20 DIAGNOSIS — K5792 Diverticulitis of intestine, part unspecified, without perforation or abscess without bleeding: Secondary | ICD-10-CM

## 2018-08-20 DIAGNOSIS — M25511 Pain in right shoulder: Secondary | ICD-10-CM | POA: Diagnosis not present

## 2018-08-20 DIAGNOSIS — I10 Essential (primary) hypertension: Secondary | ICD-10-CM | POA: Diagnosis not present

## 2018-08-20 LAB — COMPREHENSIVE METABOLIC PANEL
ALT: 15 U/L (ref 0–44)
ANION GAP: 14 (ref 5–15)
AST: 14 U/L — ABNORMAL LOW (ref 15–41)
Albumin: 4.2 g/dL (ref 3.5–5.0)
Alkaline Phosphatase: 85 U/L (ref 38–126)
BUN: 26 mg/dL — ABNORMAL HIGH (ref 6–20)
CALCIUM: 9.9 mg/dL (ref 8.9–10.3)
CO2: 28 mmol/L (ref 22–32)
Chloride: 100 mmol/L (ref 98–111)
Creatinine, Ser: 0.69 mg/dL (ref 0.44–1.00)
GFR calc Af Amer: >60 mL/min (ref >60)
Glucose, Bld: 136 mg/dL — ABNORMAL HIGH (ref 70–99)
Potassium: 3.4 mmol/L — ABNORMAL LOW (ref 3.5–5.1)
Sodium: 142 mmol/L (ref 135–145)
Total Bilirubin: 0.7 mg/dL (ref 0.3–1.2)
Total Protein: 9.1 g/dL — ABNORMAL HIGH (ref 6.5–8.1)

## 2018-08-20 LAB — CBC
HCT: 39.6 % (ref 36.0–46.0)
HEMOGLOBIN: 13 g/dL (ref 12.0–15.0)
MCH: 27.8 pg (ref 26.0–34.0)
MCHC: 32.8 g/dL (ref 30.0–36.0)
MCV: 84.6 fL (ref 78.0–100.0)
PLATELETS: 493 10*3/uL — AB (ref 150–400)
RBC: 4.68 MIL/uL (ref 3.87–5.11)
RDW: 14.9 % (ref 11.5–15.5)
WBC: 13.1 10*3/uL — AB (ref 4.0–10.5)

## 2018-08-20 LAB — TYPE AND SCREEN
ABO/RH(D): O POS
ANTIBODY SCREEN: NEGATIVE

## 2018-08-20 LAB — DIFFERENTIAL
BASOS PCT: 0 %
Basophils Absolute: 0 10*3/uL (ref 0.0–0.1)
EOS ABS: 0 10*3/uL (ref 0.0–0.7)
Eosinophils Relative: 0 %
LYMPHS ABS: 0.8 10*3/uL (ref 0.7–4.0)
Lymphocytes Relative: 6 %
MONOS PCT: 8 %
Monocytes Absolute: 1 10*3/uL (ref 0.1–1.0)
Neutro Abs: 11.3 10*3/uL — ABNORMAL HIGH (ref 1.7–7.7)
Neutrophils Relative %: 86 %

## 2018-08-20 LAB — I-STAT BETA HCG BLOOD, ED (MC, WL, AP ONLY)

## 2018-08-20 LAB — LIPASE, BLOOD: Lipase: 25 U/L (ref 11–51)

## 2018-08-20 MED ORDER — FAMOTIDINE IN NACL 20-0.9 MG/50ML-% IV SOLN
20.0000 mg | Freq: Once | INTRAVENOUS | Status: AC
  Administered 2018-08-20: 20 mg via INTRAVENOUS
  Filled 2018-08-20: qty 50

## 2018-08-20 MED ORDER — HYDROMORPHONE HCL 1 MG/ML IJ SOLN
0.5000 mg | Freq: Once | INTRAMUSCULAR | Status: AC
  Administered 2018-08-20: 0.5 mg via INTRAVENOUS
  Filled 2018-08-20: qty 1

## 2018-08-20 MED ORDER — CIPROFLOXACIN IN D5W 400 MG/200ML IV SOLN
400.0000 mg | Freq: Once | INTRAVENOUS | Status: AC
  Administered 2018-08-20: 400 mg via INTRAVENOUS
  Filled 2018-08-20: qty 200

## 2018-08-20 MED ORDER — IOPAMIDOL (ISOVUE-300) INJECTION 61%
100.0000 mL | Freq: Once | INTRAVENOUS | Status: AC | PRN
  Administered 2018-08-20: 100 mL via INTRAVENOUS

## 2018-08-20 MED ORDER — ONDANSETRON HCL 4 MG/2ML IJ SOLN
4.0000 mg | Freq: Once | INTRAMUSCULAR | Status: AC
  Administered 2018-08-20: 4 mg via INTRAVENOUS
  Filled 2018-08-20: qty 2

## 2018-08-20 MED ORDER — DICYCLOMINE HCL 20 MG PO TABS: 20.0000 mg | ORAL_TABLET | Freq: Every day | ORAL | 0 refills | Status: DC

## 2018-08-20 MED ORDER — METRONIDAZOLE IN NACL 5-0.79 MG/ML-% IV SOLN
500.0000 mg | Freq: Once | INTRAVENOUS | Status: AC
  Administered 2018-08-20: 500 mg via INTRAVENOUS
  Filled 2018-08-20: qty 100

## 2018-08-20 MED ORDER — IOPAMIDOL (ISOVUE-300) INJECTION 61%
INTRAVENOUS | Status: AC
  Filled 2018-08-20: qty 100

## 2018-08-20 MED ORDER — TRAMADOL HCL 50 MG PO TABS: 50.0000 mg | ORAL_TABLET | Freq: Four times a day (QID) | ORAL | 0 refills | Status: DC | PRN

## 2018-08-20 MED ORDER — SODIUM CHLORIDE 0.9 % IV BOLUS
1000.0000 mL | Freq: Once | INTRAVENOUS | Status: AC
  Administered 2018-08-20: 1000 mL via INTRAVENOUS

## 2018-08-20 MED ORDER — METRONIDAZOLE 500 MG PO TABS: 500.0000 mg | ORAL_TABLET | Freq: Two times a day (BID) | ORAL | 0 refills | Status: DC

## 2018-08-20 MED ORDER — CIPROFLOXACIN HCL 500 MG PO TABS: 500.0000 mg | ORAL_TABLET | Freq: Two times a day (BID) | ORAL | 0 refills | Status: DC

## 2018-08-20 MED ORDER — ONDANSETRON 4 MG PO TBDP: ORAL_TABLET | ORAL | 0 refills | Status: DC

## 2018-08-20 NOTE — Progress Notes (Signed)
   Subjective:    Patient ID: Danielle Villa, female    DOB: 02-Nov-1961, 56 y.o.   MRN: [id number]   CC: abdominal pain   HPI: Patient presenting for abdominal pain x3 weeks. Patient reports that pain is mainly epigastric in location.  Patient reports abdominal pain last all day nothing makes it better or worse.  Patient did have diarrhea in the beginning of symptoms but no longer has any diarrhea.  Denies any emesis but does report nausea.  Patient has had limited bowel movements x1 week.  Denies any blood in stool or emesis.  Patient was seen in ED on 8/25, 9/6, and 9/7. CMP on 8/25 showing normal LFTs and bilirubin. Lipase wnl. Troponins negative. No imaging obtained in ED. patient was given Protonix and Carafate on 8/25 and has been taking them since with minimal relief of symptoms.  Patient reports that prior to symptoms starting she spent 4 months drinking vinegar and lemon juice in an attempt to lose weight.  She does note that recently she has had increased gas.    Objective:  BP 118/72 (BP Location: Left Arm, Patient Position: Sitting, Cuff Size: Normal)   Pulse 92   Temp 98.9 F (37.2 C) (Oral)   Ht 5\' 2"  (1.575 m)   Wt 122 lb 9.6 oz (55.6 kg)   SpO2 99%   BMI 22.42 kg/m  Vitals and nursing note reviewed  General: well nourished, in no acute distress HEENT: normocephalic, moist mucous membranes  Cardiac: RRR, clear S1 and S2, no murmurs, rubs, or gallops Respiratory: clear to auscultation bilaterally, no increased work of breathing Abdomen: soft, diffuse tenderness, no rebound, no guarding, nondistended, no masses or organomegaly. Bowel sounds present Extremities: no edema or cyanosis. Warm, well perfused. 2+ radial and PT pulses bilaterally Skin: warm and dry, no rashes noted Neuro: alert and oriented, no focal deficits   Assessment & Plan:    Abdominal pain, epigastric Patient with constant abdominal pain x3 weeks.  Abdominal pain was preceded by drinking  increased amounts of vinegar and lemon juice.  Unclear etiology at this time.  Can consider peptic ulcer disease, however, unable to obtain H. pylori testing as patient has already been taking a PPI for several weeks.  Can consider irritable bowel syndrome as patient does have history of mood disturbance on Paxil.  Chart review reveals that patient may have had abdominal pain in the past as well.  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  Cannot rule out diverticulitis, however no red flag symptoms of fevers or vomiting.  Will obtain CT abdomen with contrast to rule out diverticulitis or other structural abnormalities.  Will give trial of Bentyl to see if this improves abdominal pain.  If Bentyl improves abdominal pain likely IBS diagnosis.  Have also given referral for GI for likely scope as this will take some time for referral to go through.  Encourage patient to use bland diet and increase fluid intake in interim to prevent dehydration.  Strict return precautions given -follow up in 1 month if no improvement -trial of Bentyl 20mg  daily x2 weeks  -CT abd/pelvis (scheduled prior to patient leaving office) -ambulatory referral to GI  Discussed patient with Dr. Lunette Stands     Return in about 4 weeks (around 09/17/2018), or if symptoms worsen or fail to improve.   Caroline More, DO, PGY-2

## 2018-08-20 NOTE — ED Notes (Signed)
Finishing IV abt at this time.

## 2018-08-20 NOTE — Assessment & Plan Note (Addendum)
Patient with constant abdominal pain x3 weeks.  Abdominal pain was preceded by drinking increased amounts of vinegar and lemon juice.  Unclear etiology at this time.  Can consider peptic ulcer disease, however, unable to obtain H. pylori testing as patient has already been taking a PPI for several weeks.  Can consider irritable bowel syndrome as patient does have history of mood disturbance on Paxil.  Chart review reveals that patient may have had abdominal pain in the past as well.  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  Cannot rule out diverticulitis, however no red flag symptoms of fevers or vomiting.  Will obtain CT abdomen with contrast to rule out diverticulitis or other structural abnormalities.  Will give trial of Bentyl to see if this improves abdominal pain.  If Bentyl improves abdominal pain likely IBS diagnosis.  Have also given referral for GI for likely scope as this will take some time for referral to go through.  Encourage patient to use bland diet and increase fluid intake in interim to prevent dehydration.  Strict return precautions given -follow up in 1 month if no improvement -trial of Bentyl 20mg  daily x2 weeks  -CT abd/pelvis (scheduled prior to patient leaving office) -ambulatory referral to GI  Discussed patient with Dr. Lunette Stands

## 2018-08-20 NOTE — Discharge Instructions (Addendum)
Follow-up with the [facility] gastroenterology next week.  Call for an appointment tomorrow.  Return to the emergency department if pain becoming severe or you develop fever or vomiting

## 2018-08-20 NOTE — Patient Instructions (Addendum)
Bland Diet A bland diet consists of foods that do not have a lot of fat or fiber. Foods without fat or fiber are easier for the body to digest. They are also less likely to irritate your mouth, throat, stomach, and other parts of your gastrointestinal tract. A bland diet is sometimes called a BRAT diet. What is my plan? Your health care provider or dietitian may recommend specific changes to your diet to prevent and treat your symptoms, such as:  Eating small meals often.  Cooking food until it is soft enough to chew easily.  Chewing your food well.  Drinking fluids slowly.  Not eating foods that are very spicy, sour, or fatty.  Not eating citrus fruits, such as oranges and grapefruit.  What do I need to know about this diet?  Eat a variety of foods from the bland diet food list.  Do not follow a bland diet longer than you have to.  Ask your health care provider whether you should take vitamins. What foods can I eat? Grains  Hot cereals, such as cream of wheat. Bread, crackers, or tortillas made from refined white flour. Rice. Vegetables Canned or cooked vegetables. Mashed or boiled potatoes. Fruits Bananas. Applesauce. Other types of cooked or canned fruit with the skin and seeds removed, such as canned peaches or pears. Meats and Other Protein Sources Scrambled eggs. Creamy peanut butter or other nut butters. Lean, well-cooked meats, such as chicken or fish. Tofu. Soups or broths. Dairy Low-fat dairy products, such as milk, cottage cheese, or yogurt. Beverages Water. Herbal tea. Apple juice. Sweets and Desserts Pudding. Custard. Fruit gelatin. Ice cream. Fats and Oils Mild salad dressings. Canola or olive oil. The items listed above may not be a complete list of allowed foods or beverages. Contact your dietitian for more options. What foods are not recommended? Foods and ingredients that are often not recommended include:  Spicy foods, such as hot sauce or  salsa.  Fried foods.  Sour foods, such as pickled or fermented foods.  Raw vegetables or fruits, especially citrus or berries.  Caffeinated drinks.  Alcohol.  Strongly flavored seasonings or condiments.  The items listed above may not be a complete list of foods and beverages that are not allowed. Contact your dietitian for more information. This information is not intended to replace advice given to you by your health care provider. Make sure you discuss any questions you have with your health care provider. Document Released: 03/20/2016 Document Revised: 05/04/2016 Document Reviewed: 12/09/2014 Elsevier Interactive Patient Education  [employer].   It was a pleasure seeing you today.   Today we discussed your abdominal pain  For your abdominal pain: I have given you a new medicine called bentyl. Use this daily for 2 weeks. If your pain gets worse stop the medication. I have also ordered a CT of your abdomen. I have referred you to GI.  Please follow up in 1 month if no improvement or sooner if symptoms persist or worsen. Please call the clinic immediately if you have any concerns.   Our clinic's number is [phone]. Please call with questions or concerns.   Please go to the emergency room if you have worsening abdominal pain, blood in your stool or vomit, or fevers.   Thank you,  Caroline More, DO

## 2018-08-20 NOTE — ED Triage Notes (Signed)
Per [facility] pt c/o right shoulder pain and abd pain that been on going for week. Today noticed bright blood in her stools.

## 2018-08-20 NOTE — ED Provider Notes (Signed)
[facility] DEPT Provider Note   CSN: [id number] Arrival date & time: 08/20/18  1435     History   Chief Complaint Chief Complaint  Patient presents with  . Abdominal Pain  . Shoulder Pain  . Rectal Bleeding    HPI Danielle Villa is a 57 y.o. female.  Patient complains of abdominal pain some nausea for few weeks which seems to be getting worse and worse.  No fever  The history is provided by the patient. No language interpreter was used.  Abdominal Pain   This is a new problem. The current episode started more than 2 days ago. The problem occurs constantly. The problem has not changed since onset.The pain is associated with an unknown factor. The pain is located in the generalized abdominal region. The quality of the pain is aching. The pain is at a severity of 5/10. The pain is moderate. Pertinent negatives include anorexia, diarrhea, frequency, hematuria and headaches. Nothing aggravates the symptoms.    Past Medical History:  Diagnosis Date  . Back pain     Patient Active Problem List   Diagnosis Date Noted  . Abdominal pain, epigastric 08/20/2018  . Pain of both hip joints 08/06/2018  . TMJ (temporomandibular joint disorder) 08/03/2018  . Ringworm 05/17/2018  . OA (osteoarthritis) of hip 03/15/2018  . Sleep-wake cycle disorder 03/15/2018  . Trochanteric bursitis of both hips 12/19/2017  . Abdominal pain, left lower quadrant 11/28/2017  . Encounter for disability assessment 11/13/2017  . Chronic hand pain, right 10/16/2017  . Hypertrophic lichen planus 00/92/3300  . Mood disturbance 09/07/2017  . Left knee pain 07/04/2017  . Constipation 05/17/2017  . POLYURIA 12/29/2009  . ONYCHOMYCOSIS, TOENAILS 02/01/2009  . FIBROIDS, UTERUS 04/29/2008  . MENORRHAGIA 03/06/2008  . HYPERCHOLESTEROLEMIA 02/07/2007  . PANIC ATTACKS 02/07/2007  . BACK PAIN, LOW 02/07/2007  . INCONTINENCE, FECAL 02/07/2007    History reviewed. No pertinent  surgical history.   OB History    Gravida  5   Para  4   Term  4   Preterm      AB      Living  4     SAB      TAB      Ectopic      Multiple      Live Births  4            Home Medications    Prior to Admission medications   Medication Sig Start Date End Date Taking? Authorizing Provider  aspirin (ASPIRIN EC) 81 MG EC tablet Take 1 tablet (81 mg total) by mouth daily. 12/31/17  Yes Lovenia Kim, MD  atorvastatin (LIPITOR) 20 MG tablet TAKE 1 TABLET BY MOUTH ONCE DAILY Patient taking differently: Take 20 mg by mouth every evening.  07/11/18  Yes Sela Hilding, MD  Calcium Carbonate-Vitamin D (CALTRATE 600+D) 600-400 MG-UNIT tablet Take 1 tablet by mouth daily. 11/06/17  Yes Lovenia Kim, MD  Melatonin 2.5 MG CAPS Take 1 capsule (2.5 mg total) by mouth at bedtime. 03/13/18  Yes Sela Hilding, MD  pantoprazole (PROTONIX) 20 MG tablet Take 1 tablet (20 mg total) by mouth 2 (two) times daily. 08/04/18  Yes Lacretia Leigh, MD  PARoxetine (PAXIL) 20 MG tablet Take 1 tablet (20 mg total) by mouth daily. 07/30/18  Yes Lavella Hammock, MD  sucralfate (CARAFATE) 1 g tablet Take 1 tablet (1 g total) by mouth 4 (four) times daily. 08/04/18  Yes Lacretia Leigh, MD  traZODone (  DESYREL) 50 MG tablet Take 1 tablet (50 mg total) by mouth at bedtime as needed. for sleep 07/30/18  Yes Lavella Hammock, MD  ciprofloxacin (CIPRO) 500 MG tablet Take 1 tablet (500 mg total) by mouth 2 (two) times daily. 08/20/18   Milton Ferguson, MD  dicyclomine (BENTYL) 20 MG tablet Take 1 tablet (20 mg total) by mouth daily. 08/20/18   Caroline More, DO  metroNIDAZOLE (FLAGYL) 500 MG tablet Take 1 tablet (500 mg total) by mouth 2 (two) times daily. One po bid x 7 days 08/20/18   Milton Ferguson, MD  ondansetron (ZOFRAN ODT) 4 MG disintegrating tablet 4mg  ODT q4 hours prn nausea/vomit 08/20/18   Milton Ferguson, MD  traMADol (ULTRAM) 50 MG tablet Take 1 tablet (50 mg total) by mouth every 6 (six) hours  as needed. 08/20/18   Milton Ferguson, MD    Family History Family History  Problem Relation Age of Onset  . Hypertension Mother   . Diabetes Mother   . Hypertension Maternal Grandmother     Social History Social History   Tobacco Use  . Smoking status: Never Smoker  . Smokeless tobacco: Never Used  Substance Use Topics  . Alcohol use: Yes    Comment: occasionaly  . Drug use: No     Allergies   Patient has no known allergies.   Review of Systems Review of Systems  Constitutional: Negative for appetite change and fatigue.  HENT: Negative for congestion, ear discharge and sinus pressure.   Eyes: Negative for discharge.  Respiratory: Negative for cough.   Cardiovascular: Negative for chest pain.  Gastrointestinal: Positive for abdominal pain. Negative for anorexia and diarrhea.  Genitourinary: Negative for frequency and hematuria.  Musculoskeletal: Negative for back pain.  Skin: Negative for rash.  Neurological: Negative for seizures and headaches.  Psychiatric/Behavioral: Negative for hallucinations.     Physical Exam Updated Vital Signs BP 118/72 (BP Location: Right Arm)   Pulse 74   Temp 99 F (37.2 C) (Oral)   Resp 17   SpO2 100%   Physical Exam  Constitutional: She is oriented to person, place, and time. She appears well-developed.  HENT:  Head: Normocephalic.  Eyes: Conjunctivae and EOM are normal. No scleral icterus.  Neck: Neck supple. No thyromegaly present.  Cardiovascular: Normal rate and regular rhythm. Exam reveals no gallop and no friction rub.  No murmur heard. Pulmonary/Chest: No stridor. She has no wheezes. She has no rales. She exhibits no tenderness.  Abdominal: She exhibits no distension. There is tenderness. There is no rebound.  Mild tenderness throughout  Musculoskeletal: Normal range of motion. She exhibits no edema.  Lymphadenopathy:    She has no cervical adenopathy.  Neurological: She is oriented to person, place, and time. She  exhibits normal muscle tone. Coordination normal.  Skin: No rash noted. No erythema.  Psychiatric: She has a normal mood and affect. Her behavior is normal.     ED Treatments / Results  Labs (all labs ordered are listed, but only abnormal results are displayed) Labs Reviewed  COMPREHENSIVE METABOLIC PANEL - Abnormal; Notable for the following components:      Result Value   Potassium 3.4 (*)    Glucose, Bld 136 (*)    BUN 26 (*)    Total Protein 9.1 (*)    AST 14 (*)    All other components within normal limits  CBC - Abnormal; Notable for the following components:   WBC 13.1 (*)    Platelets 493 (*)  All other components within normal limits  DIFFERENTIAL - Abnormal; Notable for the following components:   Neutro Abs 11.3 (*)    All other components within normal limits  LIPASE, BLOOD  I-STAT BETA HCG BLOOD, ED ([facility], [facility], [facility] ONLY)  POC OCCULT BLOOD, ED  TYPE AND SCREEN  ABO/RH    EKG None  Radiology Dg Shoulder Right  Result Date: 08/20/2018 CLINICAL DATA:  Pain for 1 week EXAM: RIGHT SHOULDER - 2+ VIEW COMPARISON:  None. FINDINGS: Frontal, oblique, and Y scapular images obtained. There is no evident fracture or dislocation. Joint spaces appear normal. No erosive change. Visualized right lung clear. IMPRESSION: No fracture or dislocation.  No appreciable arthropathy. Electronically Signed   By: Lowella Grip III M.D.   On: 08/20/2018 16:56   Ct Abdomen Pelvis W Contrast  Result Date: 08/20/2018 CLINICAL DATA:  Abdominal pain with blood in stool EXAM: CT ABDOMEN AND PELVIS WITH CONTRAST TECHNIQUE: Multidetector CT imaging of the abdomen and pelvis was performed using the standard protocol following bolus administration of intravenous contrast. CONTRAST:  18mL ISOVUE-300 IOPAMIDOL (ISOVUE-300) INJECTION 61% COMPARISON:  None. FINDINGS: Lower chest: There is atelectatic change in the posterior right base. Lung bases otherwise are clear. Hepatobiliary: No focal liver  lesions are appreciable. Gallbladder wall is not appreciably thickened. There is no biliary duct dilatation. Pancreas: There is no pancreatic mass or inflammatory focus. Spleen: No splenic lesions are evident. Adrenals/Urinary Tract: Adrenals bilaterally appear normal. There is a cyst arising from the upper pole of the right kidney measuring 1.5 x 1.5 cm. There is no hydronephrosis on either side. There is no evident renal or ureteral calculus on either side. Urinary bladder is midline with wall thickness within normal limits. Stomach/Bowel: There is wall thickening throughout much of the sigmoid colon. There is mild adjacent mesenteric thickening in this area as well as fluid tracking to the left of the lower sigmoid colon. These findings are felt to represent a degree of diverticulitis. Multiple diverticula are noted throughout this area. No perforation or abscess is noted in the sigmoid colon region. Elsewhere, there is moderate stool in the colon. No bowel wall thickening is noted elsewhere in the abdomen or pelvis. No small bowel obstruction evident. No free air or portal venous air evident. Vascular/Lymphatic: There are foci of calcification in both common iliac arteries. No aneurysm. Major mesenteric arterial vessels appear patent. There is no adenopathy in the abdomen or pelvis. Reproductive: The uterus is anteverted. There are multiple calcifications in the uterus with irregularity to the contour of the uterus, likely due to leiomyomatous change. There is no extrauterine pelvic mass. Note that inflammation from the diverticulitis abuts the leftward aspect of the uterus. Other: Appendix appears normal. No abscess or ascites evident in the abdomen or pelvis. There is a minimal ventral hernia containing only fat. Musculoskeletal: There is vacuum phenomenon at L5-S1. There are no blastic or lytic bone lesions. No intramuscular lesions are evident. IMPRESSION: 1. Evidence of sigmoid colonic diverticulitis. No  abscess or perforation in this area. 2. Uterus is irregular in contour and contains multiple foci of calcification. Suspect leiomyomatous change throughout the uterus. Nonemergent pelvic ultrasound in this regard may be advisable. 3. No evident bowel obstruction. No abscess evident in the abdomen or pelvis. Appendix region appears normal. 4.  No evident renal or ureteral calculus.  No hydronephrosis. 5.  Minimal ventral hernia containing only fat. Electronically Signed   By: Lowella Grip III M.D.   On: 08/20/2018 17:19  Procedures Procedures (including critical care time)  Medications Ordered in ED Medications  iopamidol (ISOVUE-300) 61 % injection (has no administration in time range)  metroNIDAZOLE (FLAGYL) IVPB 500 mg (500 mg Intravenous New Bag/Given 08/20/18 1930)  famotidine (PEPCID) IVPB 20 mg premix (0 mg Intravenous Stopped 08/20/18 1721)  HYDROmorphone (DILAUDID) injection 0.5 mg (0.5 mg Intravenous Given 08/20/18 1601)  ondansetron (ZOFRAN) injection 4 mg (4 mg Intravenous Given 08/20/18 1601)  sodium chloride 0.9 % bolus 1,000 mL (1,000 mLs Intravenous New Bag/Given 08/20/18 1717)  iopamidol (ISOVUE-300) 61 % injection 100 mL (100 mLs Intravenous Contrast Given 08/20/18 1647)  ciprofloxacin (CIPRO) IVPB 400 mg (400 mg Intravenous New Bag/Given 08/20/18 1827)     Initial Impression / Assessment and Plan / ED Course  I have reviewed the triage vital signs and the nursing notes.  Pertinent labs & imaging results that were available during my care of the patient were reviewed by me and considered in my medical decision making (see chart for details).     CT scan shows diverticulitis.  Patient is nontoxic.  She will placed on Cipro and Flagyl also given Ultram and Zofran and will follow up with GI next week.  She will return sooner if problems Final Clinical Impressions(s) / ED Diagnoses   Final diagnoses:  Diverticulitis    ED Discharge Orders         Ordered     ciprofloxacin (CIPRO) 500 MG tablet  2 times daily     08/20/18 1930    metroNIDAZOLE (FLAGYL) 500 MG tablet  2 times daily     08/20/18 1930    ondansetron (ZOFRAN ODT) 4 MG disintegrating tablet     08/20/18 1930    traMADol (ULTRAM) 50 MG tablet  Every 6 hours PRN     08/20/18 1930           Milton Ferguson, MD 08/20/18 1933

## 2018-08-21 ENCOUNTER — Telehealth: Payer: Self-pay

## 2018-08-21 LAB — ABO/RH: ABO/RH(D): O POS

## 2018-08-21 NOTE — Telephone Encounter (Signed)
Please call and cancel CT. I had ordered the CT and patient went to ED after the appointment with me.   Thank you,  Caroline More, DO, PGY-2 [facility] 08/21/2018 1:39 PM

## 2018-08-21 NOTE — Telephone Encounter (Signed)
Katharine Look informed and appt canceled. Dashea Mcmullan, Salome Spotted, CMA

## 2018-08-21 NOTE — Telephone Encounter (Signed)
Katharine Look, CT at [facility], called asking about pts upcoming CT apt. Katharine Look stated pt just had this same CT done in ER on 9/10. Does this need to be cancelled? Or proceed as scheduled? Please call her back at [phone].

## 2018-08-22 ENCOUNTER — Ambulatory Visit (HOSPITAL_COMMUNITY): Admission: RE | Admit: 2018-08-22 | Payer: Medicaid Other | Source: Ambulatory Visit

## 2018-08-28 DIAGNOSIS — K5732 Diverticulitis of large intestine without perforation or abscess without bleeding: Secondary | ICD-10-CM | POA: Diagnosis not present

## 2018-09-05 DIAGNOSIS — Z7689 Persons encountering health services in other specified circumstances: Secondary | ICD-10-CM | POA: Diagnosis not present

## 2018-09-12 DIAGNOSIS — S134XXA Sprain of ligaments of cervical spine, initial encounter: Secondary | ICD-10-CM | POA: Diagnosis not present

## 2018-09-12 DIAGNOSIS — Z7689 Persons encountering health services in other specified circumstances: Secondary | ICD-10-CM | POA: Diagnosis not present

## 2018-09-12 DIAGNOSIS — M5137 Other intervertebral disc degeneration, lumbosacral region: Secondary | ICD-10-CM | POA: Diagnosis not present

## 2018-09-12 DIAGNOSIS — M9901 Segmental and somatic dysfunction of cervical region: Secondary | ICD-10-CM | POA: Diagnosis not present

## 2018-09-12 DIAGNOSIS — M9903 Segmental and somatic dysfunction of lumbar region: Secondary | ICD-10-CM | POA: Diagnosis not present

## 2018-09-12 DIAGNOSIS — M5414 Radiculopathy, thoracic region: Secondary | ICD-10-CM | POA: Diagnosis not present

## 2018-09-12 DIAGNOSIS — M9902 Segmental and somatic dysfunction of thoracic region: Secondary | ICD-10-CM | POA: Diagnosis not present

## 2018-09-17 ENCOUNTER — Encounter: Payer: Self-pay | Admitting: Family Medicine

## 2018-09-17 ENCOUNTER — Ambulatory Visit (INDEPENDENT_AMBULATORY_CARE_PROVIDER_SITE_OTHER): Payer: Medicaid Other | Admitting: Family Medicine

## 2018-09-17 ENCOUNTER — Other Ambulatory Visit: Payer: Self-pay

## 2018-09-17 VITALS — BP 138/64 | HR 67 | Temp 98.2°F | Ht 62.0 in | Wt 118.0 lb

## 2018-09-17 DIAGNOSIS — R1013 Epigastric pain: Secondary | ICD-10-CM

## 2018-09-17 DIAGNOSIS — F4310 Post-traumatic stress disorder, unspecified: Secondary | ICD-10-CM | POA: Diagnosis not present

## 2018-09-17 DIAGNOSIS — R531 Weakness: Secondary | ICD-10-CM

## 2018-09-17 DIAGNOSIS — Z23 Encounter for immunization: Secondary | ICD-10-CM | POA: Diagnosis not present

## 2018-09-17 DIAGNOSIS — Z7689 Persons encountering health services in other specified circumstances: Secondary | ICD-10-CM | POA: Diagnosis not present

## 2018-09-17 DIAGNOSIS — Z1239 Encounter for other screening for malignant neoplasm of breast: Secondary | ICD-10-CM | POA: Diagnosis not present

## 2018-09-17 MED ORDER — TRAMADOL HCL 50 MG PO TABS: 50.0000 mg | ORAL_TABLET | Freq: Four times a day (QID) | ORAL | 0 refills | Status: DC | PRN

## 2018-09-17 MED ORDER — ASPIRIN 81 MG PO TBEC: 81.0000 mg | DELAYED_RELEASE_TABLET | Freq: Every day | ORAL | 3 refills | Status: AC

## 2018-09-17 MED ORDER — ATORVASTATIN CALCIUM 20 MG PO TABS: 20.0000 mg | ORAL_TABLET | Freq: Every evening | ORAL | 6 refills | Status: DC

## 2018-09-17 NOTE — Progress Notes (Signed)
   CC: f/u ED   HPI  Abd pain - was having bleeding with Bms prior to ED visit. Was using a youtube weight cleanse and doing more than instructed (vinegar and lemon). Thinks this caused her weight loss and GI sxs. Stopped the vinegar after Ed.   Since that time, she has been having weak spells. Improved with eating a small piece of candy, had to hold on to the wall. Wondered if we could test her diabetes or her sugar.   Wt Readings from Last 3 Encounters:  09/17/18 118 lb (53.5 kg)  08/20/18 122 lb 9.6 oz (55.6 kg)  08/04/18 128 lb (58.1 kg)    ROS: Denies CP, SOB, abdominal pain, dysuria, changes in BMs.   CC, SH/smoking status, and VS noted  Objective: BP 138/64   Pulse 67   Temp 98.2 F (36.8 C) (Oral)   Ht 5\' 2"  (1.575 m)   Wt 118 lb (53.5 kg)   SpO2 99%   BMI 21.58 kg/m  Gen: NAD, alert, cooperative, and pleasant. HEENT: NCAT, EOMI, PERRL CV: RRR, no murmur Resp: CTAB, no wheezes, non-labored Abd: SNTND, BS present, no guarding or organomegaly Ext: No edema, warm Neuro: Alert and oriented, Speech clear, No gross deficits  Assessment and plan:  F/u diverticulitis: resolved. Patient wondered whether she needed additional abx, reassured her that she does not. She says she has had multiple colonoscopies in the past, we don't have records, will request.   Concern for hypoglycemia - no good way to test this. Her CBGs in ED visits have been normal. Encouraged her to eat high protein meals and snacks to prevent hypoglycemia.     Orders Placed This Encounter  Procedures  . MM Digital Screening    Standing Status:   Future    Standing Expiration Date:   11/18/2019    Order Specific Question:   Reason for Exam (SYMPTOM  OR DIAGNOSIS REQUIRED)    Answer:   screening for breast cancer    Order Specific Question:   Is the patient pregnant?    Answer:   No    Order Specific Question:   Preferred imaging location?    Answer:   [facility]  . Flu Vaccine QUAD 36+ mos  IM    Meds ordered this encounter  Medications  . aspirin (ASPIRIN EC) 81 MG EC tablet    Sig: Take 1 tablet (81 mg total) by mouth daily.    Dispense:  30 tablet    Refill:  3  . atorvastatin (LIPITOR) 20 MG tablet    Sig: Take 1 tablet (20 mg total) by mouth every evening.    Dispense:  30 tablet    Refill:  6    Please consider 90 day supplies to promote better adherence  . DISCONTD: traMADol (ULTRAM) 50 MG tablet    Sig: Take 1 tablet (50 mg total) by mouth every 6 (six) hours as needed.    Dispense:  15 tablet    Refill:  0  . traMADol (ULTRAM) 50 MG tablet    Sig: Take 1 tablet (50 mg total) by mouth every 6 (six) hours as needed.    Dispense:  30 tablet    Refill:  0    Health Maintenance reviewed - mammo ordered, flu shot given, colonoscopy requested.  Ralene Ok, MD, PGY3 09/20/2018 6:16 AM

## 2018-09-17 NOTE — Patient Instructions (Signed)
It was a pleasure to see you today! Thank you for choosing [facility] for your primary care. Danielle Villa was seen for ED follow up, mole.   Our plans for today were:  Please schedule with derm clinic in the next few weeks.   I sent refills.   You can keep going to the ortho office for your injections as needed.   Try to eat high protein meals to help with your blood sugar.   Best,  Dr. Lindell Noe

## 2018-09-18 ENCOUNTER — Encounter (INDEPENDENT_AMBULATORY_CARE_PROVIDER_SITE_OTHER): Payer: Self-pay | Admitting: Orthopaedic Surgery

## 2018-09-18 ENCOUNTER — Ambulatory Visit (INDEPENDENT_AMBULATORY_CARE_PROVIDER_SITE_OTHER): Payer: Medicaid Other | Admitting: Orthopaedic Surgery

## 2018-09-18 DIAGNOSIS — M25552 Pain in left hip: Secondary | ICD-10-CM

## 2018-09-18 DIAGNOSIS — M25551 Pain in right hip: Secondary | ICD-10-CM

## 2018-09-18 DIAGNOSIS — G8929 Other chronic pain: Secondary | ICD-10-CM | POA: Diagnosis not present

## 2018-09-18 DIAGNOSIS — M545 Low back pain: Secondary | ICD-10-CM

## 2018-09-18 DIAGNOSIS — Z7689 Persons encountering health services in other specified circumstances: Secondary | ICD-10-CM | POA: Diagnosis not present

## 2018-09-18 NOTE — Progress Notes (Signed)
   Office Visit Note   Patient: Danielle Villa           Date of Birth: 1961/07/31           MRN: [id number] Visit Date: 09/18/2018              Requested by: Sela Hilding, MD [address] [place], [place] [address] PCP: Sela Hilding, MD   Assessment & Plan: Visit Diagnoses:  1. Chronic midline low back pain without sciatica   2. Pain of both hip joints     Plan: At this point patient has only had partial relief from the injection.  I think her symptoms are more consistent with lumbar pathology.  Therefore we will order a lumbar spine MRI to rule out structural abnormalities.  Follow-up after the MRI.  Patient in agreement.  Follow-Up Instructions: Return in about 2 weeks (around 10/02/2018).   Orders:  Orders Placed This Encounter  Procedures  . MR Lumbar Spine w/o contrast   No orders of the defined types were placed in this encounter.     Procedures: No procedures performed   Clinical Data: No additional findings.   Subjective: Chief Complaint  Patient presents with  . Right Hip - Pain, Follow-up  . Left Hip - Pain, Follow-up    Sherran follows up today for bilateral hip pain.  Trochanteric injections helped only partially.  She still has what sounds like radicular symptoms in both legs.   Review of Systems   Objective: Vital Signs: There were no vitals taken for this visit.  Physical Exam  Ortho Exam Stable exam. Specialty Comments:  No specialty comments available.  Imaging: No results found.   PMFS History: Patient Active Problem List   Diagnosis Date Noted  . Abdominal pain, epigastric 08/20/2018  . Pain of both hip joints 08/06/2018  . TMJ (temporomandibular joint disorder) 08/03/2018  . Ringworm 05/17/2018  . OA (osteoarthritis) of hip 03/15/2018  . Sleep-wake cycle disorder 03/15/2018  . Trochanteric bursitis of both hips 12/19/2017  . Abdominal pain, left lower quadrant 11/28/2017  . Encounter for disability  assessment 11/13/2017  . Chronic hand pain, right 10/16/2017  . Hypertrophic lichen planus 82/95/6213  . Mood disturbance 09/07/2017  . Left knee pain 07/04/2017  . Constipation 05/17/2017  . POLYURIA 12/29/2009  . ONYCHOMYCOSIS, TOENAILS 02/01/2009  . FIBROIDS, UTERUS 04/29/2008  . MENORRHAGIA 03/06/2008  . HYPERCHOLESTEROLEMIA 02/07/2007  . PANIC ATTACKS 02/07/2007  . BACK PAIN, LOW 02/07/2007  . INCONTINENCE, FECAL 02/07/2007   Past Medical History:  Diagnosis Date  . Back pain     Family History  Problem Relation Age of Onset  . Hypertension Mother   . Diabetes Mother   . Hypertension Maternal Grandmother     History reviewed. No pertinent surgical history. Social History   Occupational History  . Not on file  Tobacco Use  . Smoking status: Never Smoker  . Smokeless tobacco: Never Used  Substance and Sexual Activity  . Alcohol use: Yes    Comment: occasionaly  . Drug use: No  . Sexual activity: Not Currently

## 2018-09-27 ENCOUNTER — Ambulatory Visit
Admission: RE | Admit: 2018-09-27 | Discharge: 2018-09-27 | Disposition: A | Discharge status: Home / Self Care | Payer: Medicaid Other | Source: Ambulatory Visit | Attending: Orthopaedic Surgery | Admitting: Orthopaedic Surgery

## 2018-09-27 DIAGNOSIS — Z7689 Persons encountering health services in other specified circumstances: Secondary | ICD-10-CM | POA: Diagnosis not present

## 2018-09-27 DIAGNOSIS — M545 Low back pain, unspecified: Secondary | ICD-10-CM

## 2018-09-27 DIAGNOSIS — M5136 Other intervertebral disc degeneration, lumbar region: Secondary | ICD-10-CM | POA: Diagnosis not present

## 2018-09-27 DIAGNOSIS — G8929 Other chronic pain: Secondary | ICD-10-CM

## 2018-10-01 ENCOUNTER — Ambulatory Visit (INDEPENDENT_AMBULATORY_CARE_PROVIDER_SITE_OTHER): Payer: Medicaid Other | Admitting: Orthopaedic Surgery

## 2018-10-01 ENCOUNTER — Encounter (INDEPENDENT_AMBULATORY_CARE_PROVIDER_SITE_OTHER): Payer: Self-pay | Admitting: Orthopaedic Surgery

## 2018-10-01 DIAGNOSIS — G8929 Other chronic pain: Secondary | ICD-10-CM

## 2018-10-01 DIAGNOSIS — M545 Low back pain: Secondary | ICD-10-CM | POA: Diagnosis not present

## 2018-10-01 DIAGNOSIS — Z7689 Persons encountering health services in other specified circumstances: Secondary | ICD-10-CM | POA: Diagnosis not present

## 2018-10-01 DIAGNOSIS — K5732 Diverticulitis of large intestine without perforation or abscess without bleeding: Secondary | ICD-10-CM | POA: Diagnosis not present

## 2018-10-01 DIAGNOSIS — R1032 Left lower quadrant pain: Secondary | ICD-10-CM | POA: Diagnosis not present

## 2018-10-01 NOTE — Progress Notes (Signed)
Office Visit Note   Patient: Danielle Villa           Date of Birth: March 27, 1961           MRN: [id number] Visit Date: 10/01/2018              Requested by: Sela Hilding, MD [address] [place], [place] [address] PCP: Sela Hilding, MD   Assessment & Plan: Visit Diagnoses:  1. Chronic midline low back pain without sciatica     Plan: MRI findings reviewed with the patient today which show small disc herniation of L4-5 and L5-S1.  No significant stenosis.  Recommend referring her to Dr. Ernestina Patches for lumbar spine ESI.  Follow up as needed.  Follow-Up Instructions: Return if symptoms worsen or fail to improve.   Orders:  No orders of the defined types were placed in this encounter.  No orders of the defined types were placed in this encounter.     Procedures: No procedures performed   Clinical Data: No additional findings.   Subjective: Chief Complaint  Patient presents with  . Lower Back - Follow-up    Danielle Villa returns today for MRI review.  She's doing about the same.  Still    Review of Systems  Constitutional: Negative.   HENT: Negative.   Eyes: Negative.   Respiratory: Negative.   Cardiovascular: Negative.   Endocrine: Negative.   Musculoskeletal: Negative.   Neurological: Negative.   Hematological: Negative.   Psychiatric/Behavioral: Negative.   All other systems reviewed and are negative.    Objective: Vital Signs: There were no vitals taken for this visit.  Physical Exam  Constitutional: She is oriented to person, place, and time. She appears well-developed and well-nourished.  Pulmonary/Chest: Effort normal.  Neurological: She is alert and oriented to person, place, and time.  Skin: Skin is warm. Capillary refill takes less than 2 seconds.  Psychiatric: She has a normal mood and affect. Her behavior is normal. Judgment and thought content normal.  Nursing note and vitals reviewed.   Ortho Exam Stable exam. Specialty  Comments:  No specialty comments available.  Imaging: No results found.   PMFS History: Patient Active Problem List   Diagnosis Date Noted  . Abdominal pain, epigastric 08/20/2018  . Pain of both hip joints 08/06/2018  . TMJ (temporomandibular joint disorder) 08/03/2018  . Ringworm 05/17/2018  . OA (osteoarthritis) of hip 03/15/2018  . Sleep-wake cycle disorder 03/15/2018  . Trochanteric bursitis of both hips 12/19/2017  . Abdominal pain, left lower quadrant 11/28/2017  . Encounter for disability assessment 11/13/2017  . Chronic hand pain, right 10/16/2017  . Hypertrophic lichen planus 09/62/8366  . Mood disturbance 09/07/2017  . Left knee pain 07/04/2017  . Constipation 05/17/2017  . POLYURIA 12/29/2009  . ONYCHOMYCOSIS, TOENAILS 02/01/2009  . FIBROIDS, UTERUS 04/29/2008  . MENORRHAGIA 03/06/2008  . HYPERCHOLESTEROLEMIA 02/07/2007  . PANIC ATTACKS 02/07/2007  . BACK PAIN, LOW 02/07/2007  . INCONTINENCE, FECAL 02/07/2007   Past Medical History:  Diagnosis Date  . Back pain     Family History  Problem Relation Age of Onset  . Hypertension Mother   . Diabetes Mother   . Hypertension Maternal Grandmother     History reviewed. No pertinent surgical history. Social History   Occupational History  . Not on file  Tobacco Use  . Smoking status: Never Smoker  . Smokeless tobacco: Never Used  Substance and Sexual Activity  . Alcohol use: Yes    Comment: occasionaly  . Drug  use: No  . Sexual activity: Not Currently       

## 2018-10-01 NOTE — Addendum Note (Signed)
Addended byLaurann Montana on: 10/01/2018 01:03 PM   Modules accepted: Orders

## 2018-10-02 ENCOUNTER — Ambulatory Visit (INDEPENDENT_AMBULATORY_CARE_PROVIDER_SITE_OTHER): Payer: Medicaid Other | Admitting: Orthopaedic Surgery

## 2018-10-02 ENCOUNTER — Encounter (HOSPITAL_COMMUNITY): Payer: Self-pay | Admitting: Psychiatry

## 2018-10-02 ENCOUNTER — Ambulatory Visit (INDEPENDENT_AMBULATORY_CARE_PROVIDER_SITE_OTHER): Payer: Medicaid Other | Admitting: Psychiatry

## 2018-10-02 VITALS — BP 107/72 | HR 82 | Ht 62.0 in | Wt 119.0 lb

## 2018-10-02 DIAGNOSIS — F4310 Post-traumatic stress disorder, unspecified: Secondary | ICD-10-CM

## 2018-10-02 DIAGNOSIS — M9901 Segmental and somatic dysfunction of cervical region: Secondary | ICD-10-CM | POA: Diagnosis not present

## 2018-10-02 DIAGNOSIS — M9903 Segmental and somatic dysfunction of lumbar region: Secondary | ICD-10-CM | POA: Diagnosis not present

## 2018-10-02 DIAGNOSIS — M5414 Radiculopathy, thoracic region: Secondary | ICD-10-CM | POA: Diagnosis not present

## 2018-10-02 DIAGNOSIS — F3341 Major depressive disorder, recurrent, in partial remission: Secondary | ICD-10-CM

## 2018-10-02 DIAGNOSIS — F410 Panic disorder [episodic paroxysmal anxiety] without agoraphobia: Secondary | ICD-10-CM

## 2018-10-02 DIAGNOSIS — M9902 Segmental and somatic dysfunction of thoracic region: Secondary | ICD-10-CM | POA: Diagnosis not present

## 2018-10-02 DIAGNOSIS — M5137 Other intervertebral disc degeneration, lumbosacral region: Secondary | ICD-10-CM | POA: Diagnosis not present

## 2018-10-02 DIAGNOSIS — S134XXA Sprain of ligaments of cervical spine, initial encounter: Secondary | ICD-10-CM | POA: Diagnosis not present

## 2018-10-02 DIAGNOSIS — Z7689 Persons encountering health services in other specified circumstances: Secondary | ICD-10-CM | POA: Diagnosis not present

## 2018-10-02 MED ORDER — TRAZODONE HCL 50 MG PO TABS: 50.0000 mg | ORAL_TABLET | Freq: Every evening | ORAL | 0 refills | Status: DC | PRN

## 2018-10-02 MED ORDER — PAROXETINE HCL 20 MG PO TABS: 20.0000 mg | ORAL_TABLET | Freq: Every day | ORAL | 0 refills | Status: DC
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
## 2018-10-04 ENCOUNTER — Other Ambulatory Visit: Payer: Self-pay | Admitting: Gastroenterology

## 2018-10-04 DIAGNOSIS — K5732 Diverticulitis of large intestine without perforation or abscess without bleeding: Secondary | ICD-10-CM

## 2018-10-04 DIAGNOSIS — R1032 Left lower quadrant pain: Secondary | ICD-10-CM

## 2018-10-08 ENCOUNTER — Other Ambulatory Visit: Payer: Self-pay

## 2018-10-10 ENCOUNTER — Other Ambulatory Visit: Payer: Self-pay

## 2018-10-15 ENCOUNTER — Ambulatory Visit (INDEPENDENT_AMBULATORY_CARE_PROVIDER_SITE_OTHER): Payer: Self-pay

## 2018-10-15 ENCOUNTER — Ambulatory Visit (INDEPENDENT_AMBULATORY_CARE_PROVIDER_SITE_OTHER): Payer: Medicaid Other | Admitting: Physical Medicine and Rehabilitation

## 2018-10-15 ENCOUNTER — Encounter (INDEPENDENT_AMBULATORY_CARE_PROVIDER_SITE_OTHER): Payer: Self-pay | Admitting: Physical Medicine and Rehabilitation

## 2018-10-15 ENCOUNTER — Other Ambulatory Visit: Payer: Self-pay

## 2018-10-15 VITALS — BP 142/85 | HR 92 | Temp 98.1°F

## 2018-10-15 DIAGNOSIS — Z7689 Persons encountering health services in other specified circumstances: Secondary | ICD-10-CM | POA: Diagnosis not present

## 2018-10-15 DIAGNOSIS — F4310 Post-traumatic stress disorder, unspecified: Secondary | ICD-10-CM | POA: Diagnosis not present

## 2018-10-15 DIAGNOSIS — M5416 Radiculopathy, lumbar region: Secondary | ICD-10-CM

## 2018-10-15 DIAGNOSIS — M5116 Intervertebral disc disorders with radiculopathy, lumbar region: Secondary | ICD-10-CM

## 2018-10-15 MED ORDER — METHYLPREDNISOLONE ACETATE 80 MG/ML IJ SUSP
80.0000 mg | Freq: Once | INTRAMUSCULAR | Status: AC
  Administered 2018-10-15: 80 mg

## 2018-10-15 NOTE — Progress Notes (Signed)
Danielle Villa - 57 y.o. female MRN [id number]  Date of birth: [birth year]  Office Visit Note: Visit Date: 10/15/2018 PCP: Sela Hilding, MD Referred by: Sela Hilding, MD  Subjective: Chief Complaint  Patient presents with  . Lower Back - Pain  . Left Thigh - Pain  . Right Thigh - Pain   HPI: Danielle Villa is a 57 y.o. female who comes in today For planned right L4-5 interlaminar epidural steroid injection.  She is followed closely by Dr. Eduard Roux in our office.  She failed conservative care of management of her chronic lower back pain which radiates more on the right leg.  This is worse with standing and sitting for prolonged periods.  He did obtain MRI of the lumbar spine which is reviewed with the patient with images and a spine model.  She has small disc herniations at L4-5 L5-S1 more right paracentral more right paracentral without nerve compression.  We are going to complete a diagnostic and hopefully therapeutic epidural injection with fluoroscopic guidance.  Depending on relief would repeat the injection versus trying a transforaminal approach.  Patient unlikely to be spine surgical candidate although there are small disc herniations.  ROS Otherwise per HPI.  Assessment & Plan: Visit Diagnoses:  1. Lumbar radiculopathy   2. Radiculopathy due to lumbar intervertebral disc disorder     Plan: No additional findings.   Meds & Orders:  Meds ordered this encounter  Medications  . methylPREDNISolone acetate (DEPO-MEDROL) injection 80 mg    Orders Placed This Encounter  Procedures  . XR C-ARM NO REPORT  . Epidural Steroid injection    Follow-up: Return if symptoms worsen or fail to improve.   Procedures: No procedures performed  Lumbar Epidural Steroid Injection - Interlaminar Approach with Fluoroscopic Guidance  Patient: Danielle Villa      Date of Birth: December 23, 1960 MRN: [id number] PCP: Sela Hilding, MD      Visit Date: 10/15/2018     Universal Protocol:     Consent Given By: the patient  Position: PRONE  Additional Comments: Vital signs were monitored before and after the procedure. Patient was prepped and draped in the usual sterile fashion. The correct patient, procedure, and site was verified.   Injection Procedure Details:  Procedure Site One Meds Administered:  Meds ordered this encounter  Medications  . methylPREDNISolone acetate (DEPO-MEDROL) injection 80 mg     Laterality: Right  Location/Site:  L4-L5  Needle size: 20 G  Needle type: Tuohy  Needle Placement: Paramedian epidural  Findings:   -Comments: Excellent flow of contrast into the epidural space.  Procedure Details: Using a paramedian approach from the side mentioned above, the region overlying the inferior lamina was localized under fluoroscopic visualization and the soft tissues overlying this structure were infiltrated with 4 ml. of 1% Lidocaine without Epinephrine. The Tuohy needle was inserted into the epidural space using a paramedian approach.   The epidural space was localized using loss of resistance along with lateral and bi-planar fluoroscopic views.  After negative aspirate for air, blood, and CSF, a 2 ml. volume of Isovue-250 was injected into the epidural space and the flow of contrast was observed. Radiographs were obtained for documentation purposes.    The injectate was administered into the level noted above.   Additional Comments:  The patient tolerated the procedure well Dressing: Band-Aid    Post-procedure details: Patient was observed during the procedure. Post-procedure instructions were reviewed.  Patient left the clinic in stable condition.  Clinical History: MRI LUMBAR SPINE WITHOUT CONTRAST  TECHNIQUE: Multiplanar, multisequence MR imaging of the lumbar spine was performed. No intravenous contrast was administered.  COMPARISON:  CT Abdomen and Pelvis 08/20/2018. Lumbar  radiographs 05/09/2016.  FINDINGS: Segmentation:  Normal on the comparisons.  Alignment:  Preserved lumbar lordosis with no spondylolisthesis.  Vertebrae: No marrow edema or evidence of acute osseous abnormality. Visualized bone marrow signal is within normal limits. Intact visible sacrum and SI joints.  Conus medullaris and cauda equina: Conus extends to the L2 level. No lower spinal cord or conus signal abnormality.  Paraspinal and other soft tissues: Negative.  Disc levels:  T10-T11: Negative.  T11-T12: Negative.  T12-L1:  Negative.  L1-L2:  Negative.  L2-L3:  Negative.  L3-L4: Minimal disc desiccation and disc bulging. Borderline to mild facet and ligament flavum hypertrophy. No stenosis.  L4-L5: Mild disc desiccation. Small broad-based central to slightly right paracentral disc protrusion (series 7, image 26). Disc material is in proximity to the descending right L5 nerve roots in the lateral recess, but there is no spinal stenosis or convincing neural impingement.  L5-S1: Disc space loss with vacuum disc. Mild circumferential disc bulge and endplate spurring. Small superimposed central disc protrusion (series 7, image 32). Disc material in proximity to the descending S1 nerve roots in the lateral recesses. Mild facet hypertrophy. No spinal stenosis or convincing neural impingement.  IMPRESSION: 1. Lumbar disc degeneration at L4-L5 and L5-S1 with small disc herniations. These could be a source of L5 and S1 radiculitis, although there is no associated spinal stenosis or convincing neural impingement. 2. Minimal lumbar spine degeneration otherwise.   Electronically Signed   By: Genevie Ann M.D.   On: 09/27/2018 14:59   She reports that she has never smoked. She has never used smokeless tobacco. No results for input(s): HGBA1C, LABURIC in the last 8760 hours.  Objective:  VS:  HT:    WT:   BMI:     BP:(!) 142/85  HR:92bpm  TEMP:98.1 F  (36.7 C)(Oral)  RESP:  Physical Exam  Ortho Exam Imaging: No results found.  Past Medical/Family/Surgical/Social History: Medications & Allergies reviewed per EMR, new medications updated. Patient Active Problem List   Diagnosis Date Noted  . Abdominal pain, epigastric 08/20/2018  . Pain of both hip joints 08/06/2018  . TMJ (temporomandibular joint disorder) 08/03/2018  . Ringworm 05/17/2018  . OA (osteoarthritis) of hip 03/15/2018  . Sleep-wake cycle disorder 03/15/2018  . Trochanteric bursitis of both hips 12/19/2017  . Abdominal pain, left lower quadrant 11/28/2017  . Encounter for disability assessment 11/13/2017  . Chronic hand pain, right 10/16/2017  . Hypertrophic lichen planus 64/33/2951  . Mood disturbance 09/07/2017  . Left knee pain 07/04/2017  . Constipation 05/17/2017  . POLYURIA 12/29/2009  . ONYCHOMYCOSIS, TOENAILS 02/01/2009  . FIBROIDS, UTERUS 04/29/2008  . MENORRHAGIA 03/06/2008  . HYPERCHOLESTEROLEMIA 02/07/2007  . PANIC ATTACKS 02/07/2007  . BACK PAIN, LOW 02/07/2007  . INCONTINENCE, FECAL 02/07/2007   Past Medical History:  Diagnosis Date  . Back pain    Family History  Problem Relation Age of Onset  . Hypertension Mother   . Diabetes Mother   . Hypertension Maternal Grandmother    History reviewed. No pertinent surgical history. Social History   Occupational History  . Not on file  Tobacco Use  . Smoking status: Never Smoker  . Smokeless tobacco: Never Used  Substance and Sexual Activity  . Alcohol use: Yes    Comment: occasionaly  . Drug use: No  .  Sexual activity: Not Currently

## 2018-10-15 NOTE — Patient Instructions (Signed)

## 2018-10-15 NOTE — Progress Notes (Signed)
 .  Numeric Pain Rating Scale and Functional Assessment Average Pain 7   In the last MONTH (on 0-10 scale) has pain interfered with the following?  1. General activity like being  able to carry out your everyday physical activities such as walking, climbing stairs, carrying groceries, or moving a chair?  Rating(4)   +Driver, -BT, -Dye Allergies.  

## 2018-10-15 NOTE — Telephone Encounter (Signed)
Meloxicam not on current med list. Danley Danker, RN [facility] RN)

## 2018-10-16 DIAGNOSIS — Z7689 Persons encountering health services in other specified circumstances: Secondary | ICD-10-CM | POA: Diagnosis not present

## 2018-10-17 MED ORDER — TRAMADOL HCL 50 MG PO TABS: 50.0000 mg | ORAL_TABLET | Freq: Four times a day (QID) | ORAL | 0 refills | Status: DC | PRN

## 2018-10-18 ENCOUNTER — Ambulatory Visit
Admission: RE | Admit: 2018-10-18 | Discharge: 2018-10-18 | Disposition: A | Discharge status: Home / Self Care | Payer: Medicaid Other | Source: Ambulatory Visit | Attending: Gastroenterology | Admitting: Gastroenterology

## 2018-10-18 DIAGNOSIS — M9902 Segmental and somatic dysfunction of thoracic region: Secondary | ICD-10-CM | POA: Diagnosis not present

## 2018-10-18 DIAGNOSIS — Z7689 Persons encountering health services in other specified circumstances: Secondary | ICD-10-CM | POA: Diagnosis not present

## 2018-10-18 DIAGNOSIS — M9901 Segmental and somatic dysfunction of cervical region: Secondary | ICD-10-CM | POA: Diagnosis not present

## 2018-10-18 DIAGNOSIS — R1032 Left lower quadrant pain: Secondary | ICD-10-CM

## 2018-10-18 DIAGNOSIS — S134XXA Sprain of ligaments of cervical spine, initial encounter: Secondary | ICD-10-CM | POA: Diagnosis not present

## 2018-10-18 DIAGNOSIS — M9903 Segmental and somatic dysfunction of lumbar region: Secondary | ICD-10-CM | POA: Diagnosis not present

## 2018-10-18 DIAGNOSIS — K5732 Diverticulitis of large intestine without perforation or abscess without bleeding: Secondary | ICD-10-CM

## 2018-10-18 DIAGNOSIS — M5414 Radiculopathy, thoracic region: Secondary | ICD-10-CM | POA: Diagnosis not present

## 2018-10-18 DIAGNOSIS — K5792 Diverticulitis of intestine, part unspecified, without perforation or abscess without bleeding: Secondary | ICD-10-CM | POA: Diagnosis not present

## 2018-10-18 DIAGNOSIS — M5137 Other intervertebral disc degeneration, lumbosacral region: Secondary | ICD-10-CM | POA: Diagnosis not present

## 2018-10-18 MED ORDER — IOPAMIDOL (ISOVUE-300) INJECTION 61%
100.0000 mL | Freq: Once | INTRAVENOUS | Status: AC | PRN
  Administered 2018-10-18: 100 mL via INTRAVENOUS

## 2018-10-21 NOTE — Telephone Encounter (Signed)
Will not be filling this as a new med.

## 2018-10-21 NOTE — Telephone Encounter (Signed)
To white team.

## 2018-10-21 NOTE — Telephone Encounter (Signed)
Pt informed. Pt did not request a refill. Pt asked pharmacy to request any medication that is due. Ottis Stain, CMA

## 2018-10-28 NOTE — Procedures (Signed)
Lumbar Epidural Steroid Injection - Interlaminar Approach with Fluoroscopic Guidance  Patient: Danielle Villa      Date of Birth: 1961/10/19 MRN: [id number] PCP: Sela Hilding, MD      Visit Date: 10/15/2018   Universal Protocol:     Consent Given By: the patient  Position: PRONE  Additional Comments: Vital signs were monitored before and after the procedure. Patient was prepped and draped in the usual sterile fashion. The correct patient, procedure, and site was verified.   Injection Procedure Details:  Procedure Site One Meds Administered:  Meds ordered this encounter  Medications  . methylPREDNISolone acetate (DEPO-MEDROL) injection 80 mg     Laterality: Right  Location/Site:  L4-L5  Needle size: 20 G  Needle type: Tuohy  Needle Placement: Paramedian epidural  Findings:   -Comments: Excellent flow of contrast into the epidural space.  Procedure Details: Using a paramedian approach from the side mentioned above, the region overlying the inferior lamina was localized under fluoroscopic visualization and the soft tissues overlying this structure were infiltrated with 4 ml. of 1% Lidocaine without Epinephrine. The Tuohy needle was inserted into the epidural space using a paramedian approach.   The epidural space was localized using loss of resistance along with lateral and bi-planar fluoroscopic views.  After negative aspirate for air, blood, and CSF, a 2 ml. volume of Isovue-250 was injected into the epidural space and the flow of contrast was observed. Radiographs were obtained for documentation purposes.    The injectate was administered into the level noted above.   Additional Comments:  The patient tolerated the procedure well Dressing: Band-Aid    Post-procedure details: Patient was observed during the procedure. Post-procedure instructions were reviewed.  Patient left the clinic in stable condition.

## 2018-11-12 DIAGNOSIS — M9901 Segmental and somatic dysfunction of cervical region: Secondary | ICD-10-CM | POA: Diagnosis not present

## 2018-11-12 DIAGNOSIS — S134XXA Sprain of ligaments of cervical spine, initial encounter: Secondary | ICD-10-CM | POA: Diagnosis not present

## 2018-11-12 DIAGNOSIS — M9903 Segmental and somatic dysfunction of lumbar region: Secondary | ICD-10-CM | POA: Diagnosis not present

## 2018-11-12 DIAGNOSIS — F4310 Post-traumatic stress disorder, unspecified: Secondary | ICD-10-CM | POA: Diagnosis not present

## 2018-11-12 DIAGNOSIS — M5137 Other intervertebral disc degeneration, lumbosacral region: Secondary | ICD-10-CM | POA: Diagnosis not present

## 2018-11-12 DIAGNOSIS — M5414 Radiculopathy, thoracic region: Secondary | ICD-10-CM | POA: Diagnosis not present

## 2018-11-12 DIAGNOSIS — Z7689 Persons encountering health services in other specified circumstances: Secondary | ICD-10-CM | POA: Diagnosis not present

## 2018-11-12 DIAGNOSIS — M9902 Segmental and somatic dysfunction of thoracic region: Secondary | ICD-10-CM | POA: Diagnosis not present

## 2018-11-20 DIAGNOSIS — Z7689 Persons encountering health services in other specified circumstances: Secondary | ICD-10-CM | POA: Diagnosis not present

## 2018-11-20 DIAGNOSIS — K5732 Diverticulitis of large intestine without perforation or abscess without bleeding: Secondary | ICD-10-CM | POA: Diagnosis not present

## 2018-11-25 NOTE — Progress Notes (Signed)
Subjective:   Patient ID: Danielle Villa    DOB: 02-29-1961, 57 y.o. female   MRN: [id number]  Danielle Villa is a 57 y.o. female with a history of OA, TMJ, uterine fibroids, HLD, panic attacks, chronic hand pain here for   LLQ Pain Patient presents for ongoing persistent LLQ pain that she has experienced off and on since September when she started a vinegar and lemon juice cleanse in order to lose weight.  Has had prior CT Abd/Pelvis with diverticulitis 9/10, 11/08 with most recent concerning for possible abscess formation.  She has followed up with [facility] 12/11 and was prescribed antibiotics at that time (unsure of the name).  She was told that she would need an additional CT scan after completion of antibiotic course to determine if she has developed worsening of her abscess.  She denies acute worsening of her LLQ pain but has noticed some chills and dizziness with positional changes that are new in the last week.  She denies fevers, blood in her stool, or inability to do her normal activities.  HEADACHE Has h/o headaches for the past 6 months that alternate in location over her right and left eyes.  She describes the pain as sharp and does not radiate.  Headache typically lasts 3-4 hours.  Has been getting them 3-4 times per week.  She has tried Tylenol and tramadol with some relief.  Denies recent head trauma, sudden onset, blurred or double vision or loss of vision.  The pain limits her normal activities such as cooking.  The patient is retired.  Going to sleep helps relieve the pain.  Takes 81 mg aspirin.  Denies history of cancer.  Pain is associated with nausea, sensitivity to light and sound, visual aura (will sometimes see spots when she gets a headache).  Denies fever, vomiting, difficulty walking or speaking.  Will occasionally experience nasal congestion.  Review of Systems:  Per HPI.  [place], medications and smoking status reviewed.  Objective:   BP (!)  112/52   Pulse 79   Temp 98.5 F (36.9 C) (Oral)   Ht 5\' 2"  (1.575 m)   Wt 122 lb 4 oz (55.5 kg)   SpO2 98%   BMI 22.36 kg/m  Vitals and nursing note reviewed.  General: well nourished, well developed, in no acute distress with non-toxic appearance Neck: supple, non-tender without lymphadenopathy CV: regular rate and rhythm without murmurs, rubs, or gallops, no lower extremity edema Lungs: clear to auscultation bilaterally with normal work of breathing Abdomen: soft, TTP over LLQ with some rebound, no guarding. Non-distended, no masses or organomegaly palpable, normoactive bowel sounds Skin: warm, dry, no rashes or lesions Extremities: warm and well perfused, normal tone MSK: Full ROM, strength 5/5 to U/LE bilaterally, normal gait.  No edema.  Neuro: Alert and oriented, speech normal. Optic field normal. PERRL, Extraocular movements intact.  Intact symmetric sensation to light touch of face bilaterally.  Hearing grossly intact bilaterally.  Tongue protrudes normally with no deviation.  Shoulder shrug, smile symmetric. Finger to nose normal.  Assessment & Plan:   Diverticulitis of colon Noted on CT abdomen x2 with most recent 11/08.  Previously evaluated by surgery and currently on antibiotics with 1 week left with plans for repeat CT after finishing course.  Given recent chills and dizziness and rebound tenderness, some concern for worsening of abscess despite antibiotic treatment.  Vitals stable today without fever, low suspicion for developing systemic infection, however given concern for worsening abscess, instructed  patient to call surgeon today for reevaluation today or tomorrow.  Advised continuing antibiotic course.  Reviewed red flags and indications for ED presentation, patient verbalized understanding.  Strict return precautions given.  Headache Symptoms consistent with migraine headache, although would be unusual for initial presentation at her age.  No red flags in history or  exam.  Neuro exam within normal limits.  Advised continuation of Tylenol for pain.  If headaches continue after resolution of diverticulitis, could consider head imaging to rule out intracranial process and initiation of prophylactic therapy.  Symptom diary would likely be beneficial.  Patient agreeable to plan.  No orders of the defined types were placed in this encounter.  No orders of the defined types were placed in this encounter.   Rory Percy, DO PGY-2, [facility] 11/26/2018 9:51 PM

## 2018-11-26 ENCOUNTER — Other Ambulatory Visit: Payer: Self-pay | Admitting: Family Medicine

## 2018-11-26 ENCOUNTER — Ambulatory Visit (INDEPENDENT_AMBULATORY_CARE_PROVIDER_SITE_OTHER): Payer: Medicaid Other | Admitting: Family Medicine

## 2018-11-26 ENCOUNTER — Other Ambulatory Visit: Payer: Self-pay

## 2018-11-26 DIAGNOSIS — R51 Headache: Secondary | ICD-10-CM | POA: Diagnosis not present

## 2018-11-26 DIAGNOSIS — Z7689 Persons encountering health services in other specified circumstances: Secondary | ICD-10-CM | POA: Diagnosis not present

## 2018-11-26 DIAGNOSIS — K5732 Diverticulitis of large intestine without perforation or abscess without bleeding: Secondary | ICD-10-CM

## 2018-11-26 DIAGNOSIS — R519 Headache, unspecified: Secondary | ICD-10-CM | POA: Insufficient documentation

## 2018-11-26 NOTE — Patient Instructions (Addendum)
It was great to see you!  Our plans for today:  - Call the surgeon TODAY for an appointment sometime this week (preferrably today or tomorrow). Continue to take your antibiotics. - If you develop fever, worsened pain, vomiting go straight to the ED. - Take tylenol or tramadol as needed for headache.   Take care and seek immediate care sooner if you develop any concerns.   Dr. [facility]

## 2018-11-26 NOTE — Assessment & Plan Note (Signed)
Noted on CT abdomen x2 with most recent 11/08.  Previously evaluated by surgery and currently on antibiotics with 1 week left with plans for repeat CT after finishing course.  Given recent chills and dizziness and rebound tenderness, some concern for worsening of abscess despite antibiotic treatment.  Vitals stable today without fever, low suspicion for developing systemic infection, however given concern for worsening abscess, instructed patient to call surgeon today for reevaluation today or tomorrow.  Advised continuing antibiotic course.  Reviewed red flags and indications for ED presentation, patient verbalized understanding.  Strict return precautions given.

## 2018-11-26 NOTE — Assessment & Plan Note (Signed)
Symptoms consistent with migraine headache, although would be unusual for initial presentation at her age.  No red flags in history or exam.  Neuro exam within normal limits.  Advised continuation of Tylenol for pain.  If headaches continue after resolution of diverticulitis, could consider head imaging to rule out intracranial process and initiation of prophylactic therapy.  Symptom diary would likely be beneficial.  Patient agreeable to plan.

## 2018-11-27 ENCOUNTER — Other Ambulatory Visit: Payer: Self-pay | Admitting: Surgery

## 2018-11-27 DIAGNOSIS — K5732 Diverticulitis of large intestine without perforation or abscess without bleeding: Secondary | ICD-10-CM

## 2018-11-27 NOTE — Telephone Encounter (Signed)
Will provide 1 month refill - looks like patient is overdue for visit with me to discuss pain and further refills. Please schedule her in January for this.

## 2018-11-28 NOTE — Telephone Encounter (Signed)
Pt has appointment scheduled to see PCP in January. Katharina Caper, Shaylynn Nulty D, [place]

## 2018-11-29 DIAGNOSIS — Z7689 Persons encountering health services in other specified circumstances: Secondary | ICD-10-CM | POA: Diagnosis not present

## 2018-12-02 ENCOUNTER — Other Ambulatory Visit: Payer: Self-pay

## 2018-12-06 ENCOUNTER — Ambulatory Visit
Admission: RE | Admit: 2018-12-06 | Discharge: 2018-12-06 | Disposition: A | Discharge status: Home / Self Care | Payer: Medicaid Other | Source: Ambulatory Visit | Attending: Surgery | Admitting: Surgery

## 2018-12-06 DIAGNOSIS — K5792 Diverticulitis of intestine, part unspecified, without perforation or abscess without bleeding: Secondary | ICD-10-CM | POA: Diagnosis not present

## 2018-12-06 DIAGNOSIS — K5732 Diverticulitis of large intestine without perforation or abscess without bleeding: Secondary | ICD-10-CM

## 2018-12-06 MED ORDER — IOPAMIDOL (ISOVUE-300) INJECTION 61%
100.0000 mL | Freq: Once | INTRAVENOUS | Status: AC | PRN
  Administered 2018-12-06: 100 mL via INTRAVENOUS

## 2018-12-27 ENCOUNTER — Ambulatory Visit: Payer: Self-pay | Admitting: Surgery

## 2018-12-27 DIAGNOSIS — K5732 Diverticulitis of large intestine without perforation or abscess without bleeding: Secondary | ICD-10-CM | POA: Diagnosis not present

## 2018-12-27 DIAGNOSIS — Z7689 Persons encountering health services in other specified circumstances: Secondary | ICD-10-CM | POA: Diagnosis not present

## 2018-12-27 NOTE — H&P (View-Only) (Signed)
Surgical H&P  CC: abdominal pain  HPI: she returns today for follow-up of diverticulitis. When we last met, she was treated with a course of antibiotics and had a follow-up CT scan which as is outlined below, really did not show any improvement.  She continues to have left lower quadrant pain, complains of significant flatus and gassiness.  She states her appetite is good and she is not losing any weight, no fevers.  Initial visit 12/11/9: This is a very pleasant 58 year old woman who presents with a consultation for diverticulitis. She states that her symptoms began about 4 months ago, she was doing a diet where she drank water containing either lemon juice or vinegar for weight loss, she was able to lose weight from 150 down to 120 pounds. She then started having left-sided pain and radiates across the lower abdomen, she called the paramedics couple times and had urinary ER for evaluation and was found to have diverticulitis on a CT scan done in September. She was treated with antibiotics and followed up with Dr. Therisa Villa who has been following her, reviewed and CT scan about a month ago that demonstrated ongoing diverticulitis with a 2 x 3 cm fluid collection anterior to the sigmoid colon. Also had a fair amount of stool burden in the colon. She was subsequently treated with another 2 week course of Augmentin. She continues to have mild left lower quadrant pain. She reports frequent loose bowel movements and is taking Metamucil regularly. No fevers but she does occasionally feel hot, she reports that she is maintaining her weight but her appetite is not great.  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 and is waiting to start her disability. She helps care for her granddaughter, [name] who is here with her today. She does not smoke.  Given the duration of symptoms and failure to  resolve despite multiple courses of antibiotics, we discussed that she would likely benefit from sigmoid colectomy. We discussed the surgery including technical details and risks which include but are not limited to bleeding, infection, pain, scarring, injury to intra-abdominal structures or ureters or bladder, anastomotic leak or intra-abdominal abscess, need for colostomy or diverting ileostomy, ileus or incisional hernia as well as general risks of DVT/PE, pneumonia, stroke, heart attack, death. Given that she did have a small fluid collection on her most recently has been treated with antibiotics, I would like to repeat a CT scan to assess for resolution of that. If abscess is gotten larger, potentially we could treat with a percutaneous drain at this time.  No Known Allergies  Past Medical History:  Diagnosis Date  . Back pain     No past surgical history on file.  Family History  Problem Relation Age of Onset  . Hypertension Mother   . Diabetes Mother   . Hypertension Maternal Grandmother     Social History   Socioeconomic History  . Marital status: Divorced    Spouse name: Not on file  . Number of children: Not on file  . Years of education: Not on file  . Highest education level: Not on file  Occupational History  . Not on file  Social Needs  . Financial resource strain: Not on file  . Food insecurity:    Worry: Not on file    Inability: Not on file  . Transportation needs:    Medical: Not on file  Non-medical: Not on file  Tobacco Use  . Smoking status: Never Smoker  . Smokeless tobacco: Never Used  Substance and Sexual Activity  . Alcohol use: Yes    Comment: occasionaly  . Drug use: No  . Sexual activity: Not Currently  Lifestyle  . Physical activity:    Days per week: Not on file    Minutes per session: Not on file  . Stress: Not on file  Relationships  . Social connections:    Talks on phone: Not on file    Gets together: Not on file    Attends  religious service: Not on file    Active member of club or organization: Not on file    Attends meetings of clubs or organizations: Not on file    Relationship status: Not on file  Other Topics Concern  . Not on file  Social History Narrative  . Not on file    Current Outpatient Medications on File Prior to Visit  Medication Sig Dispense Refill  . aspirin (ASPIRIN EC) 81 MG EC tablet Take 1 tablet (81 mg total) by mouth daily. 30 tablet 3  . atorvastatin (LIPITOR) 20 MG tablet Take 1 tablet (20 mg total) by mouth every evening. 30 tablet 6  . Calcium Carbonate-Vitamin D (CALTRATE 600+D) 600-400 MG-UNIT tablet Take 1 tablet by mouth daily. 30 tablet 3  . Melatonin 2.5 MG CAPS Take 1 capsule (2.5 mg total) by mouth at bedtime. 90 capsule 3  . pantoprazole (PROTONIX) 20 MG tablet Take 1 tablet (20 mg total) by mouth 2 (two) times daily. 60 tablet 0  . PARoxetine (PAXIL) 20 MG tablet Take 1 tablet (20 mg total) by mouth daily. 90 tablet 0  . traMADol (ULTRAM) 50 MG tablet TAKE 1 TABLET BY MOUTH EVERY 6 HOURS AS NEEDED 30 tablet 0  . traZODone (DESYREL) 50 MG tablet Take 1 tablet (50 mg total) by mouth at bedtime as needed. for sleep 90 tablet 0   No current facility-administered medications on file prior to visit.     Review of Systems: a complete, 10pt review of systems was completed with pertinent positives and negatives as documented in the HPI  Physical Exam: There were no vitals filed for this visit. Gen: A&Ox3, no distress  Head: normocephalic, atraumatic Eyes: extraocular motions intact, anicteric.  Neck: supple without mass or thyromegaly Chest: unlabored respirations, symmetrical air entry, clear bilaterally   Cardiovascular: RRR with palpable distal pulses, no pedal edema Abdomen: soft, nondistended, mildly tender in left lower quadrant. No mass or organomegaly.  Extremities: warm, without edema, no deformities  Neuro: grossly intact Psych: appropriate mood and affect,  normal insight  Skin: warm and dry   CBC Latest Ref Rng & Units 08/20/2018 08/16/2018 08/04/2018  WBC 4.0 - 10.5 K/uL 13.1(H) 13.2(H) 13.9(H)  Hemoglobin 12.0 - 15.0 g/dL 13.0 11.5(L) 12.5  Hematocrit 36.0 - 46.0 % 39.6 37.3 39.9  Platelets 150 - 400 K/uL 493(H) 416(H) 454(H)    CMP Latest Ref Rng & Units 08/20/2018 08/16/2018 08/04/2018  Glucose 70 - 99 mg/dL 136(H) 137(H) 146(H)  BUN 6 - 20 mg/dL 26(H) 8 7  Creatinine 0.44 - 1.00 mg/dL 0.69 0.64 0.68  Sodium 135 - 145 mmol/L 142 141 141  Potassium 3.5 - 5.1 mmol/L 3.4(L) 3.2(L) 3.7  Chloride 98 - 111 mmol/L 100 100 107  CO2 22 - 32 mmol/L 28 25 20(L)  Calcium 8.9 - 10.3 mg/dL 9.9 9.5 9.3  Total Protein 6.5 - 8.1 g/dL 9.1(H) -  7.6  Total Bilirubin 0.3 - 1.2 mg/dL 0.7 - 0.6  Alkaline Phos 38 - 126 U/L 85 - 78  AST 15 - 41 U/L 14(L) - 21  ALT 0 - 44 U/L 15 - 12    Lab Results  Component Value Date   INR 1.0 03/06/2008    Imaging: CT 12/06/18: CLINICAL DATA:  Follow-up in diverticulitis. Patient has completed antibiotics but has residual discomfort and pain on left.  EXAM: CT ABDOMEN AND PELVIS WITH CONTRAST  TECHNIQUE: Multidetector CT imaging of the abdomen and pelvis was performed using the standard protocol following bolus administration of intravenous contrast.  CONTRAST:  162m ISOVUE-300 IOPAMIDOL (ISOVUE-300) INJECTION 61%  COMPARISON:  08/31/2018 and 10/18/2018 CT  FINDINGS: Lower chest: No acute abnormality.  Hepatobiliary: No focal liver abnormality is seen. No gallstones, gallbladder wall thickening, or biliary dilatation.  Pancreas: Unremarkable. No pancreatic ductal dilatation or surrounding inflammatory changes.  Spleen: Normal in size without focal abnormality.  Adrenals/Urinary Tract: Stable 1.5 cm simple right upper pole renal cyst. No solid enhancing mass. Symmetric cortical enhancement both kidneys with symmetric pyelograms on repeat delayed imaging. Normal bilateral adrenal glands. No  obstructive uropathy. The urinary bladder is unremarkable for the degree of distention.  Stomach/Bowel: Redemonstration of sigmoid diverticulitis with pericolonic inflammation and trace fluid in the left hemipelvis and cul-de-sac. Redemonstration of a partially cystic and partially air containing abnormality along side the left lateral aspect of the proximal sigmoid measuring 2.9 x 2.1 x 2.9 cm, previously approximately 3.2 x 2.2 x 2.2 cm. Findings would suggest a relatively unchanged pericolonic abscess. Differential considerations might include a large giant diverticulum though when compared with the more remote study from 08/20/2018, this is larger than any of the identified diverticula seen on that exam and therefore abscess is more likely. No new abscess or abnormal fluid collection. Moderate marked stool retention within the more proximal colon consistent constipation. No small bowel obstruction. The stomach is unremarkable.  Vascular/Lymphatic: No significant vascular findings are present. No enlarged abdominal or pelvic lymph nodes.  Reproductive: Calcified uterine fibroids. No adnexal mass.  Other: Abdominal wall hernia. No free air.  Musculoskeletal: No acute or significant osseous findings.  IMPRESSION: 1. Redemonstration of sigmoid diverticulitis with pericolonic inflammation and trace free fluid in the left hemipelvis and cul-de-sac. Findings appear relatively stable. 2. Redemonstration of a partially cystic and partially air containing abnormality along the left lateral aspect of the proximal sigmoid measuring 2.9 x 2.1 x 2.9 cm, previously approximately 3.2 x 2.2 x 2.2 cm. Findings would suggest a relatively unchanged pericolonic abscess. Differential considerations might include a large giant diverticulum though when compared with the more remote study from 08/20/2018, this is larger than any of the identified diverticula seen on that exam and therefore  abscess is more likely. 3. Stable 1.5 cm simple right upper pole renal cyst. 4. Fibroid uterus.   Electronically Signed   By: DAshley RoyaltyM.D.   On: 12/06/2018 14:51  A/P: Diverticulitis. She has not really progressed in either direction despite it another course of antibiotics.  She continues to have left lower quadrant pain although fortunately has not developed any evidence of systemic illness.  I do recommend at this point proceeding with sigmoid colectomy.  We discussed the surgery including technical details and risks which include but are not limited to bleeding, infection, pain, scarring, injury to intra-abdominal structures or ureters or bladder, anastomotic leak or intra-abdominal abscess, need for colostomy or diverting ileostomy, ileus or incisional hernia as  well as general risks of DVT/PE, pneumonia, stroke, heart attack, death.  Questions were welcomed and answered to her satisfaction.  She is agreeable to proceed with scheduling surgery.  I advised that if her symptoms worsen in the interim specifically she develops fevers, vomiting, or worsening pain, that she will need to go to the emergency room and be admitted and likely undergo a more emergent procedure.    Danielle Juniper, MD [facility], [place] Pager [phone]

## 2018-12-27 NOTE — H&P (Signed)
Surgical H&P  CC: abdominal pain  HPI: she returns today for follow-up of diverticulitis. When we last met, she was treated with a course of antibiotics and had a follow-up CT scan which as is outlined below, really did not show any improvement.  She continues to have left lower quadrant pain, complains of significant flatus and gassiness.  She states her appetite is good and she is not losing any weight, no fevers.  Initial visit 12/11/9: This is a very pleasant 57-year-old woman who presents with a consultation for diverticulitis. She states that her symptoms began about 4 months ago, she was doing a diet where she drank water containing either lemon juice or vinegar for weight loss, she was able to lose weight from 150 down to 120 pounds. She then started having left-sided pain and radiates across the lower abdomen, she called the paramedics couple times and had urinary ER for evaluation and was found to have diverticulitis on a CT scan done in September. She was treated with antibiotics and followed up with Dr. Karki who has been following her, reviewed and CT scan about a month ago that demonstrated ongoing diverticulitis with a 2 x 3 cm fluid collection anterior to the sigmoid colon. Also had a fair amount of stool burden in the colon. She was subsequently treated with another 2 week course of Augmentin. She continues to have mild left lower quadrant pain. She reports frequent loose bowel movements and is taking Metamucil regularly. No fevers but she does occasionally feel hot, she reports that she is maintaining her weight but her appetite is not great.  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 and is waiting to start her disability. She helps care for her granddaughter, Kinsley who is here with her today. She does not smoke.  Given the duration of symptoms and failure to  resolve despite multiple courses of antibiotics, we discussed that she would likely benefit from sigmoid colectomy. We discussed the surgery including technical details and risks which include but are not limited to bleeding, infection, pain, scarring, injury to intra-abdominal structures or ureters or bladder, anastomotic leak or intra-abdominal abscess, need for colostomy or diverting ileostomy, ileus or incisional hernia as well as general risks of DVT/PE, pneumonia, stroke, heart attack, death. Given that she did have a small fluid collection on her most recently has been treated with antibiotics, I would like to repeat a CT scan to assess for resolution of that. If abscess is gotten larger, potentially we could treat with a percutaneous drain at this time.  No Known Allergies  Past Medical History:  Diagnosis Date  . Back pain     No past surgical history on file.  Family History  Problem Relation Age of Onset  . Hypertension Mother   . Diabetes Mother   . Hypertension Maternal Grandmother     Social History   Socioeconomic History  . Marital status: Divorced    Spouse name: Not on file  . Number of children: Not on file  . Years of education: Not on file  . Highest education level: Not on file  Occupational History  . Not on file  Social Needs  . Financial resource strain: Not on file  . Food insecurity:    Worry: Not on file    Inability: Not on file  . Transportation needs:    Medical: Not on file      Non-medical: Not on file  Tobacco Use  . Smoking status: Never Smoker  . Smokeless tobacco: Never Used  Substance and Sexual Activity  . Alcohol use: Yes    Comment: occasionaly  . Drug use: No  . Sexual activity: Not Currently  Lifestyle  . Physical activity:    Days per week: Not on file    Minutes per session: Not on file  . Stress: Not on file  Relationships  . Social connections:    Talks on phone: Not on file    Gets together: Not on file    Attends  religious service: Not on file    Active member of club or organization: Not on file    Attends meetings of clubs or organizations: Not on file    Relationship status: Not on file  Other Topics Concern  . Not on file  Social History Narrative  . Not on file    Current Outpatient Medications on File Prior to Visit  Medication Sig Dispense Refill  . aspirin (ASPIRIN EC) 81 MG EC tablet Take 1 tablet (81 mg total) by mouth daily. 30 tablet 3  . atorvastatin (LIPITOR) 20 MG tablet Take 1 tablet (20 mg total) by mouth every evening. 30 tablet 6  . Calcium Carbonate-Vitamin D (CALTRATE 600+D) 600-400 MG-UNIT tablet Take 1 tablet by mouth daily. 30 tablet 3  . Melatonin 2.5 MG CAPS Take 1 capsule (2.5 mg total) by mouth at bedtime. 90 capsule 3  . pantoprazole (PROTONIX) 20 MG tablet Take 1 tablet (20 mg total) by mouth 2 (two) times daily. 60 tablet 0  . PARoxetine (PAXIL) 20 MG tablet Take 1 tablet (20 mg total) by mouth daily. 90 tablet 0  . traMADol (ULTRAM) 50 MG tablet TAKE 1 TABLET BY MOUTH EVERY 6 HOURS AS NEEDED 30 tablet 0  . traZODone (DESYREL) 50 MG tablet Take 1 tablet (50 mg total) by mouth at bedtime as needed. for sleep 90 tablet 0   No current facility-administered medications on file prior to visit.     Review of Systems: a complete, 10pt review of systems was completed with pertinent positives and negatives as documented in the HPI  Physical Exam: There were no vitals filed for this visit. Gen: A&Ox3, no distress  Head: normocephalic, atraumatic Eyes: extraocular motions intact, anicteric.  Neck: supple without mass or thyromegaly Chest: unlabored respirations, symmetrical air entry, clear bilaterally   Cardiovascular: RRR with palpable distal pulses, no pedal edema Abdomen: soft, nondistended, mildly tender in left lower quadrant. No mass or organomegaly.  Extremities: warm, without edema, no deformities  Neuro: grossly intact Psych: appropriate mood and affect,  normal insight  Skin: warm and dry   CBC Latest Ref Rng & Units 08/20/2018 08/16/2018 08/04/2018  WBC 4.0 - 10.5 K/uL 13.1(H) 13.2(H) 13.9(H)  Hemoglobin 12.0 - 15.0 g/dL 13.0 11.5(L) 12.5  Hematocrit 36.0 - 46.0 % 39.6 37.3 39.9  Platelets 150 - 400 K/uL 493(H) 416(H) 454(H)    CMP Latest Ref Rng & Units 08/20/2018 08/16/2018 08/04/2018  Glucose 70 - 99 mg/dL 136(H) 137(H) 146(H)  BUN 6 - 20 mg/dL 26(H) 8 7  Creatinine 0.44 - 1.00 mg/dL 0.69 0.64 0.68  Sodium 135 - 145 mmol/L 142 141 141  Potassium 3.5 - 5.1 mmol/L 3.4(L) 3.2(L) 3.7  Chloride 98 - 111 mmol/L 100 100 107  CO2 22 - 32 mmol/L 28 25 20(L)  Calcium 8.9 - 10.3 mg/dL 9.9 9.5 9.3  Total Protein 6.5 - 8.1 g/dL 9.1(H) -   7.6  Total Bilirubin 0.3 - 1.2 mg/dL 0.7 - 0.6  Alkaline Phos 38 - 126 U/L 85 - 78  AST 15 - 41 U/L 14(L) - 21  ALT 0 - 44 U/L 15 - 12    Lab Results  Component Value Date   INR 1.0 03/06/2008    Imaging: CT 12/06/18: CLINICAL DATA:  Follow-up in diverticulitis. Patient has completed antibiotics but has residual discomfort and pain on left.  EXAM: CT ABDOMEN AND PELVIS WITH CONTRAST  TECHNIQUE: Multidetector CT imaging of the abdomen and pelvis was performed using the standard protocol following bolus administration of intravenous contrast.  CONTRAST:  100mL ISOVUE-300 IOPAMIDOL (ISOVUE-300) INJECTION 61%  COMPARISON:  08/31/2018 and 10/18/2018 CT  FINDINGS: Lower chest: No acute abnormality.  Hepatobiliary: No focal liver abnormality is seen. No gallstones, gallbladder wall thickening, or biliary dilatation.  Pancreas: Unremarkable. No pancreatic ductal dilatation or surrounding inflammatory changes.  Spleen: Normal in size without focal abnormality.  Adrenals/Urinary Tract: Stable 1.5 cm simple right upper pole renal cyst. No solid enhancing mass. Symmetric cortical enhancement both kidneys with symmetric pyelograms on repeat delayed imaging. Normal bilateral adrenal glands. No  obstructive uropathy. The urinary bladder is unremarkable for the degree of distention.  Stomach/Bowel: Redemonstration of sigmoid diverticulitis with pericolonic inflammation and trace fluid in the left hemipelvis and cul-de-sac. Redemonstration of a partially cystic and partially air containing abnormality along side the left lateral aspect of the proximal sigmoid measuring 2.9 x 2.1 x 2.9 cm, previously approximately 3.2 x 2.2 x 2.2 cm. Findings would suggest a relatively unchanged pericolonic abscess. Differential considerations might include a large giant diverticulum though when compared with the more remote study from 08/20/2018, this is larger than any of the identified diverticula seen on that exam and therefore abscess is more likely. No new abscess or abnormal fluid collection. Moderate marked stool retention within the more proximal colon consistent constipation. No small bowel obstruction. The stomach is unremarkable.  Vascular/Lymphatic: No significant vascular findings are present. No enlarged abdominal or pelvic lymph nodes.  Reproductive: Calcified uterine fibroids. No adnexal mass.  Other: Abdominal wall hernia. No free air.  Musculoskeletal: No acute or significant osseous findings.  IMPRESSION: 1. Redemonstration of sigmoid diverticulitis with pericolonic inflammation and trace free fluid in the left hemipelvis and cul-de-sac. Findings appear relatively stable. 2. Redemonstration of a partially cystic and partially air containing abnormality along the left lateral aspect of the proximal sigmoid measuring 2.9 x 2.1 x 2.9 cm, previously approximately 3.2 x 2.2 x 2.2 cm. Findings would suggest a relatively unchanged pericolonic abscess. Differential considerations might include a large giant diverticulum though when compared with the more remote study from 08/20/2018, this is larger than any of the identified diverticula seen on that exam and therefore  abscess is more likely. 3. Stable 1.5 cm simple right upper pole renal cyst. 4. Fibroid uterus.   Electronically Signed   By: David  Kwon M.D.   On: 12/06/2018 14:51  A/P: Diverticulitis. She has not really progressed in either direction despite it another course of antibiotics.  She continues to have left lower quadrant pain although fortunately has not developed any evidence of systemic illness.  I do recommend at this point proceeding with sigmoid colectomy.  We discussed the surgery including technical details and risks which include but are not limited to bleeding, infection, pain, scarring, injury to intra-abdominal structures or ureters or bladder, anastomotic leak or intra-abdominal abscess, need for colostomy or diverting ileostomy, ileus or incisional hernia as   well as general risks of DVT/PE, pneumonia, stroke, heart attack, death.  Questions were welcomed and answered to her satisfaction.  She is agreeable to proceed with scheduling surgery.  I advised that if her symptoms worsen in the interim specifically she develops fevers, vomiting, or worsening pain, that she will need to go to the emergency room and be admitted and likely undergo a more emergent procedure.    Draysen Weygandt, MD [facility] Pager [phone]  

## 2018-12-30 ENCOUNTER — Other Ambulatory Visit: Payer: Self-pay | Admitting: Urology

## 2019-01-01 ENCOUNTER — Encounter: Payer: Self-pay | Admitting: Family Medicine

## 2019-01-01 ENCOUNTER — Ambulatory Visit (INDEPENDENT_AMBULATORY_CARE_PROVIDER_SITE_OTHER): Payer: Medicaid Other | Admitting: Family Medicine

## 2019-01-01 ENCOUNTER — Other Ambulatory Visit: Payer: Self-pay

## 2019-01-01 VITALS — BP 110/60 | HR 97 | Temp 98.3°F | Wt 122.2 lb

## 2019-01-01 DIAGNOSIS — M545 Low back pain, unspecified: Secondary | ICD-10-CM

## 2019-01-01 DIAGNOSIS — R151 Fecal smearing: Secondary | ICD-10-CM | POA: Diagnosis not present

## 2019-01-01 DIAGNOSIS — G8929 Other chronic pain: Secondary | ICD-10-CM

## 2019-01-01 DIAGNOSIS — R519 Headache, unspecified: Secondary | ICD-10-CM

## 2019-01-01 DIAGNOSIS — R51 Headache: Secondary | ICD-10-CM

## 2019-01-01 DIAGNOSIS — Z7689 Persons encountering health services in other specified circumstances: Secondary | ICD-10-CM | POA: Diagnosis not present

## 2019-01-01 MED ORDER — TRAMADOL HCL 50 MG PO TABS: 50.0000 mg | ORAL_TABLET | Freq: Four times a day (QID) | ORAL | 0 refills | Status: DC | PRN

## 2019-01-01 NOTE — Progress Notes (Signed)
   CC: multiple   HPI  Fecal urgency - says this for a long time, but not sure if this was near her children's birth or before. She did have some tears with 4 vaginal births, unsure if she had 4th degree tears but did have tears, didn't go to the OR. She says that she have a tear through her bottom. No constant drainage, but has to keep tissue there in case certain movements or coughing causes smearing.   Migraines - 4-5 months, around her eye but alternates sides. Sometimes all day, usually tylnol helps. Migraines off and on throughout her life. No weakness of speech changes.   Upcoming surgery - she reports she needs some blood drawn before her surgery - I see the future orders but can't tell whether they are for day of surgery or before.   Back pain - unchanged, no new neuro sxs. She has using tramadol, feels this helps her with more mobility during the day.  ROS: Denies CP, SOB, abdominal pain, dysuria, changes in BMs.   CC, SH/smoking status, and VS noted  Objective: BP 110/60   Pulse 97   Temp 98.3 F (36.8 C) (Oral)   Wt 122 lb 4 oz (55.5 kg)   SpO2 98%   BMI 22.36 kg/m  Gen: NAD, alert, cooperative, and pleasant. HEENT: NCAT, EOMI, PERRL CV: RRR, no murmur Resp: CTAB, no wheezes, non-labored GU exam deferred by patient.  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  She deferred rectal exam today, but based on history I wonder whether she has impaired sphincter tone from what sounds like her previous fourth degree tear.  She is already established with a general surgeon, Dr. Kae Heller.  Dr. Kae Heller is doing her sigmoidectomy in fact next week.  As this has been going on for years and is unchanged, I recommended that she follow-up with Dr. Kae Heller to discuss this.  BACK PAIN, LOW Refilled tramadol.  Explicitly instructed  patient that she is not to use tramadol with any other pain medications that she may be prescribed due to her upcoming abdominal surgery.  She voiced understanding.  No changes in back pain.  Headache  Patient reports a history of off-and-on headaches over her lifetime, she suggests that these have become more Bothersome lately.  Instructed patient to trial conservative measures with regular meals, good sleep, appropriate hydration during the day.  Continue Tylenol as needed.  We will reevaluate once she recovers from her surgery about whether she may need a controller migraine medicine.  Patient was given the phone number for her surgeon's office to call and clarify about having labs drawn.   No orders of the defined types were placed in this encounter.   Meds ordered this encounter  Medications  . traMADol (ULTRAM) 50 MG tablet    Sig: Take 1 tablet (50 mg total) by mouth every 6 (six) hours as needed.    Dispense:  30 tablet    Refill:  0     Ralene Ok, MD, PGY3 01/02/2019 3:01 PM

## 2019-01-01 NOTE — Patient Instructions (Addendum)
It was a pleasure to see you today! Thank you for choosing [facility] for your primary care. Danielle Villa was seen for check up.  Our plans for today were:  Call Dr. Ron Parker office at [phone] to check to see when they want your labs drawn.   Next time you see Dr. Kae Heller, have her evaluate the accidents/incontinence that you were having, this may be due to your tears at delivery.   For your migraines, please try getting 3 regular meals per day, 80oz of water, and 8 hrs of sleep. Let us know if things are getting worse.   I sent your back pain refills, but DO NOT TAKE THIS with narcotics that you may get after your surgery.   Best,  Dr. Lindell Noe

## 2019-01-02 ENCOUNTER — Ambulatory Visit (HOSPITAL_COMMUNITY): Payer: Self-pay | Admitting: Psychiatry

## 2019-01-02 NOTE — Assessment & Plan Note (Signed)
Chart review suggests this has been present since 2008, patient is not sure.  She deferred rectal exam today, but based on history I wonder whether she has impaired sphincter tone from what sounds like her previous fourth degree tear.  She is already established with a general surgeon, Dr. Kae Heller.  Dr. Kae Heller is doing her sigmoidectomy in fact next week.  As this has been going on for years and is unchanged, I recommended that she follow-up with Dr. Kae Heller to discuss this.

## 2019-01-02 NOTE — Assessment & Plan Note (Signed)
Patient reports a history of off-and-on headaches over her lifetime, she suggests that these have become more Bothersome lately.  Instructed patient to trial conservative measures with regular meals, good sleep, appropriate hydration during the day.  Continue Tylenol as needed.  We will reevaluate once she recovers from her surgery about whether she may need a controller migraine medicine.

## 2019-01-02 NOTE — Assessment & Plan Note (Signed)
Refilled tramadol.  Explicitly instructed patient that she is not to use tramadol with any other pain medications that she may be prescribed due to her upcoming abdominal surgery.  She voiced understanding.  No changes in back pain.

## 2019-01-02 NOTE — Patient Instructions (Addendum)
Danielle Villa  01/02/2019   Your procedure is scheduled on: 01/06/2019  Report to [facility] Main  Entrance  Report to admitting at 1030  AM    Call this number if you have problems the morning of surgery [phone]   Remember: Do not eat food or drink liquids :After Midnight. BRUSH YOUR TEETH MORNING OF SURGERY AND RINSE YOUR MOUTH OUT, NO CHEWING GUM CANDY OR MINTS.  DRINK 2 PRESURGERY ENSURE DRINKS THE NIGHT BEFORE SURGERY AT  1000 PM AND 1 PRESURGERY DRINK THE DAY OF THE PROCEDURE 3 HOURS PRIOR TO SCHEDULED SURGERY. NO SOLIDS AFTER MIDNIGHT THE DAY PRIOR TO THE SURGERY. NOTHING BY MOUTH EXCEPT CLEAR LIQUIDS UNTIL THREE HOURS PRIOR TO SCHEDULED SURGERY. PLEASE FINISH PRESURGERY ENSURE DRINK PER SURGEON ORDER 3 HOURS PRIOR TO SCHEDULED SURGERY TIME WHICH NEEDS TO BE COMPLETED AT ___0930am ______.   On day of bowel prep follow clear liquid diet .  CLEAR LIQUID DIET   Foods Allowed                                                                     Foods Excluded  Coffee and tea, regular and decaf                             liquids that you cannot  Plain Jell-O in any flavor                                             see through such as: Fruit ices (not with fruit pulp)                                     milk, soups, orange juice  Iced Popsicles                                    All solid food Carbonated beverages, regular and diet                                    Cranberry, grape and apple juices Sports drinks like Gatorade Lightly seasoned clear broth or consume(fat free) Sugar, honey syrup  Sample Menu Breakfast                                Lunch                                     Supper Cranberry juice                    Beef broth  Chicken broth Jell-O                                     Grape juice                           Apple juice Coffee or tea                        Jell-O                                       Popsicle                                                Coffee or tea                        Coffee or tea  _____________________________________________________________________     Take these medicines the morning of surgery with A SIP OF WATER: protonix, Paxil                                 You may not have any metal on your body including hair pins and              piercings  Do not wear jewelry, make-up, lotions, powders or perfumes, deodorant             Do not wear nail polish.  Do not shave  48 hours prior to surgery.                 Do not bring valuables to the hospital. [place].  Contacts, dentures or bridgework may not be worn into surgery.  Leave suitcase in the car. After surgery it may be brought to your room.                 Please read over the following fact sheets you were given: _____________________________________________________________________             [facility] - Preparing for Surgery Before surgery, you can play an important role.  Because skin is not sterile, your skin needs to be as free of germs as possible.  You can reduce the number of germs on your skin by washing with CHG (chlorahexidine gluconate) soap before surgery.  CHG is an antiseptic cleaner which kills germs and bonds with the skin to continue killing germs even after washing. Please DO NOT use if you have an allergy to CHG or antibacterial soaps.  If your skin becomes reddened/irritated stop using the CHG and inform your nurse when you arrive at Short Stay. Do not shave (including legs and underarms) for at least 48 hours prior to the first CHG shower.  You may shave your face/neck. Please follow these instructions carefully:  1.  Shower with CHG Soap the night before surgery and the  morning of Surgery.  2.  If you choose to wash your hair, wash  your hair first as usual with your  normal  shampoo.  3.  After you shampoo, rinse your  hair and body thoroughly to remove the  shampoo.                           4.  Use CHG as you would any other liquid soap.  You can apply chg directly  to the skin and wash                       Gently with a scrungie or clean washcloth.  5.  Apply the CHG Soap to your body ONLY FROM THE NECK DOWN.   Do not use on face/ open                           Wound or open sores. Avoid contact with eyes, ears mouth and genitals (private parts).                       Wash face,  Genitals (private parts) with your normal soap.             6.  Wash thoroughly, paying special attention to the area where your surgery  will be performed.  7.  Thoroughly rinse your body with warm water from the neck down.  8.  DO NOT shower/wash with your normal soap after using and rinsing off  the CHG Soap.                9.  Pat yourself dry with a clean towel.            10.  Wear clean pajamas.            11.  Place clean sheets on your bed the night of your first shower and do not  sleep with pets. Day of Surgery : Do not apply any lotions/deodorants the morning of surgery.  Please wear clean clothes to the hospital/surgery center.  FAILURE TO FOLLOW THESE INSTRUCTIONS MAY RESULT IN THE CANCELLATION OF YOUR SURGERY PATIENT SIGNATURE_________________________________  NURSE SIGNATURE__________________________________  ________________________________________________________________________   Danielle Villa  An incentive spirometer is a tool that can help keep your lungs clear and active. This tool measures how well you are filling your lungs with each breath. Taking long deep breaths may help reverse or decrease the chance of developing breathing (pulmonary) problems (especially infection) following:  A long period of time when you are unable to move or be active. BEFORE THE PROCEDURE   If the spirometer includes an indicator to show your best effort, your nurse or respiratory therapist will set it to a  desired goal.  If possible, sit up straight or lean slightly forward. Try not to slouch.  Hold the incentive spirometer in an upright position. INSTRUCTIONS FOR USE  1. Sit on the edge of your bed if possible, or sit up as far as you can in bed or on a chair. 2. Hold the incentive spirometer in an upright position. 3. Breathe out normally. 4. Place the mouthpiece in your mouth and seal your lips tightly around it. 5. Breathe in slowly and as deeply as possible, raising the piston or the ball toward the top of the column. 6. Hold your breath for 3-5 seconds or for as long as possible. Allow the piston or ball to fall to the bottom of the column.  7. Remove the mouthpiece from your mouth and breathe out normally. 8. Rest for a few seconds and repeat Steps 1 through 7 at least 10 times every 1-2 hours when you are awake. Take your time and take a few normal breaths between deep breaths. 9. The spirometer may include an indicator to show your best effort. Use the indicator as a goal to work toward during each repetition. 10. After each set of 10 deep breaths, practice coughing to be sure your lungs are clear. If you have an incision (the cut made at the time of surgery), support your incision when coughing by placing a pillow or rolled up towels firmly against it. Once you are able to get out of bed, walk around indoors and cough well. You may stop using the incentive spirometer when instructed by your caregiver.  RISKS AND COMPLICATIONS  Take your time so you do not get dizzy or light-headed.  If you are in pain, you may need to take or ask for pain medication before doing incentive spirometry. It is harder to take a deep breath if you are having pain. AFTER USE  Rest and breathe slowly and easily.  It can be helpful to keep track of a log of your progress. Your caregiver can provide you with a simple table to help with this. If you are using the spirometer at home, follow these  instructions: [place] IF:   You are having difficultly using the spirometer.  You have trouble using the spirometer as often as instructed.  Your pain medication is not giving enough relief while using the spirometer.  You develop fever of 100.5 F (38.1 C) or higher. SEEK IMMEDIATE MEDICAL CARE IF:   You cough up bloody sputum that had not been present before.  You develop fever of 102 F (38.9 C) or greater.  You develop worsening pain at or near the incision site. MAKE SURE YOU:   Understand these instructions.  Will watch your condition.  Will get help right away if you are not doing well or get worse. Document Released: 04/09/2007 Document Revised: 02/19/2012 Document Reviewed: 06/10/2007 [facility] Patient Information 2014 [place], [place].   ________________________________________________________________________

## 2019-01-03 ENCOUNTER — Encounter (HOSPITAL_COMMUNITY): Payer: Self-pay

## 2019-01-03 ENCOUNTER — Other Ambulatory Visit: Payer: Self-pay

## 2019-01-03 ENCOUNTER — Encounter (HOSPITAL_COMMUNITY)
Admission: RE | Admit: 2019-01-03 | Discharge: 2019-01-03 | Disposition: A | Discharge status: Home / Self Care | Payer: Medicaid Other | Source: Ambulatory Visit | Attending: Surgery | Admitting: Surgery

## 2019-01-03 DIAGNOSIS — Z01818 Encounter for other preprocedural examination: Secondary | ICD-10-CM | POA: Insufficient documentation

## 2019-01-03 DIAGNOSIS — K5732 Diverticulitis of large intestine without perforation or abscess without bleeding: Secondary | ICD-10-CM | POA: Diagnosis not present

## 2019-01-03 HISTORY — DX: Unspecified osteoarthritis, unspecified site: M19.90

## 2019-01-03 HISTORY — DX: Headache, unspecified: R51.9

## 2019-01-03 HISTORY — DX: Headache: R51

## 2019-01-03 HISTORY — DX: Major depressive disorder, single episode, unspecified: F32.9

## 2019-01-03 HISTORY — DX: Anxiety disorder, unspecified: F41.9

## 2019-01-03 HISTORY — DX: Depression, unspecified: F32.A

## 2019-01-03 LAB — CBC WITH DIFFERENTIAL/PLATELET
Abs Immature Granulocytes: 0.02 10*3/uL (ref 0.00–0.07)
Basophils Absolute: 0.1 10*3/uL (ref 0.0–0.1)
Basophils Relative: 1 %
Eosinophils Absolute: 0.1 10*3/uL (ref 0.0–0.5)
Eosinophils Relative: 2 %
HCT: 37.2 % (ref 36.0–46.0)
Hemoglobin: 11.3 g/dL — ABNORMAL LOW (ref 12.0–15.0)
Immature Granulocytes: 0 %
Lymphocytes Relative: 20 %
Lymphs Abs: 1.1 10*3/uL (ref 0.7–4.0)
MCH: 27.2 pg (ref 26.0–34.0)
MCHC: 30.4 g/dL (ref 30.0–36.0)
MCV: 89.6 fL (ref 80.0–100.0)
Monocytes Absolute: 0.4 10*3/uL (ref 0.1–1.0)
Monocytes Relative: 8 %
Neutro Abs: 3.6 10*3/uL (ref 1.7–7.7)
Neutrophils Relative %: 69 %
Platelets: 390 10*3/uL (ref 150–400)
RBC: 4.15 MIL/uL (ref 3.87–5.11)
RDW: 15.5 % (ref 11.5–15.5)
WBC: 5.2 10*3/uL (ref 4.0–10.5)
nRBC: 0 % (ref 0.0–0.2)

## 2019-01-03 LAB — COMPREHENSIVE METABOLIC PANEL
ALT: 15 U/L (ref 0–44)
AST: 19 U/L (ref 15–41)
Albumin: 3.8 g/dL (ref 3.5–5.0)
Alkaline Phosphatase: 67 U/L (ref 38–126)
Anion gap: 10 (ref 5–15)
BUN: 18 mg/dL (ref 6–20)
CALCIUM: 8.8 mg/dL — AB (ref 8.9–10.3)
CO2: 24 mmol/L (ref 22–32)
Chloride: 105 mmol/L (ref 98–111)
Creatinine, Ser: 0.57 mg/dL (ref 0.44–1.00)
GFR calc non Af Amer: >60 mL/min (ref >60)
Glucose, Bld: 95 mg/dL (ref 70–99)
Potassium: 3.5 mmol/L (ref 3.5–5.1)
SODIUM: 139 mmol/L (ref 135–145)
Total Bilirubin: 0.8 mg/dL (ref 0.3–1.2)
Total Protein: 7.6 g/dL (ref 6.5–8.1)

## 2019-01-03 LAB — HEMOGLOBIN A1C
Hgb A1c MFr Bld: 5.8 % — ABNORMAL HIGH (ref 4.8–5.6)
Mean Plasma Glucose: 119.76 mg/dL

## 2019-01-03 LAB — NO BLOOD PRODUCTS

## 2019-01-03 NOTE — Progress Notes (Signed)
Patient in today for preop appt.  .  Patient states she has not yet received any preop bowel prep instructions per [facility] Surgery.  Called office and spoke with Armen, Triage at office who will fax over preop bowel prep instructions.  Received 2 sets of bowel preop instructions by fax.  Called office back for clarification of which bowel prep instructions patient is to receive.  Per Sunday Spillers in Triage at [place] patient is to receive bowel prep instructions with antibiotics.  [facility] [address] per patient is her pharmacy.  Info given to [facility].  Reviewed bowel prep instructions with patient and given to her.  Copy also on chart.

## 2019-01-03 NOTE — Progress Notes (Signed)
Patient seen at preop by Pecolia Ades RN - [facility] Coordinator and by Ostomy Nurse- Orma Flaming.

## 2019-01-03 NOTE — Consult Note (Signed)
[place] Nurse requested for preoperative stoma site marking.    Discussed surgical procedure and stoma creation with patient and family.  Explained role of the [place] nurse team.  Provided the patient with educational booklet and provided samples of pouching options.  Answered patient and family questions.   Examined patient  sitting, and standing in order to place the marking in the patient's visual field, away from any creases or abdominal contour issues and within the rectus muscle.Patient had rapid weight loss with sagging skin noted to abdomen near umbilicus.  Will mark above umbilicus due to patient practice of wearing leggings most days that stop at her waist/umblical line. She is able to visualize this area and is free of creases and wrinkles that are noted at the umbilicus.    Marked for colostomy in the LLQ  4 cm to the left of the umbilicus and 3 _cm above the umbilicus.  Marked for ileostomy in the RLQ 4 _cm to the right of the umbilicus and 3  cm above the umbilicus.   Patient's abdomen cleansed with CHG wipes at site markings, allowed to air dry prior to marking.Covered mark with thin film transparent dressing to preserve mark until date of surgery.  Explained role of ostomy nurse and creation of stoma   Education on emptying when 1/3 to 1/2 full and how to empty  Discussed bathing, diet, gas, medication use, constipation Discussed risk of peristomal hernia  Answered patient/family questions:  Patient had concerns about uncontrollable bowel movements, odor and being out of her home.  We discussed the pouching and that we would identify and place her in an ideal pouch for long wear time.  [place] Nurse team will follow up with patient after surgery for continue ostomy care and teaching.   Domenic Moras MSN, RN, FNP-BC [facility] Wound, Ostomy, Continence Nurse Pager [phone]

## 2019-01-03 NOTE — Progress Notes (Signed)
Blood Refusal Consent form faxed to Dr Kae Heller( [place]), [facility] ( Dr Gloriann Loan) and to [facility] with fax confirmations on chart.  Blood Refusal placed in FYI in Epic , Allergies , Special Needs Column on OR Schedule and in MD orders. Progress note routed to Dr Kae Heller and to Dr Gloriann Loan regarding Blood Refusal , also.

## 2019-01-03 NOTE — Progress Notes (Signed)
Blood Product Refusal form signed at preop on 01/03/2019.

## 2019-01-05 ENCOUNTER — Other Ambulatory Visit: Payer: Self-pay

## 2019-01-05 ENCOUNTER — Emergency Department (HOSPITAL_COMMUNITY): Payer: Medicaid Other

## 2019-01-05 ENCOUNTER — Emergency Department (HOSPITAL_COMMUNITY)
Admission: EM | Admit: 2019-01-05 | Discharge: 2019-01-06 | Disposition: A | Discharge status: Home / Self Care | Payer: Medicaid Other | Source: Home / Self Care | Attending: Emergency Medicine | Admitting: Emergency Medicine

## 2019-01-05 ENCOUNTER — Encounter (HOSPITAL_COMMUNITY): Payer: Self-pay

## 2019-01-05 DIAGNOSIS — R7989 Other specified abnormal findings of blood chemistry: Secondary | ICD-10-CM | POA: Diagnosis not present

## 2019-01-05 DIAGNOSIS — R197 Diarrhea, unspecified: Secondary | ICD-10-CM | POA: Diagnosis not present

## 2019-01-05 DIAGNOSIS — R0789 Other chest pain: Secondary | ICD-10-CM | POA: Diagnosis not present

## 2019-01-05 DIAGNOSIS — Z79899 Other long term (current) drug therapy: Secondary | ICD-10-CM

## 2019-01-05 DIAGNOSIS — R079 Chest pain, unspecified: Secondary | ICD-10-CM | POA: Diagnosis not present

## 2019-01-05 DIAGNOSIS — E876 Hypokalemia: Secondary | ICD-10-CM | POA: Insufficient documentation

## 2019-01-05 DIAGNOSIS — R1030 Lower abdominal pain, unspecified: Secondary | ICD-10-CM | POA: Insufficient documentation

## 2019-01-05 DIAGNOSIS — Z87891 Personal history of nicotine dependence: Secondary | ICD-10-CM | POA: Insufficient documentation

## 2019-01-05 DIAGNOSIS — Z7982 Long term (current) use of aspirin: Secondary | ICD-10-CM | POA: Insufficient documentation

## 2019-01-05 DIAGNOSIS — R110 Nausea: Secondary | ICD-10-CM | POA: Diagnosis not present

## 2019-01-05 LAB — COMPREHENSIVE METABOLIC PANEL
ALT: 15 U/L (ref 0–44)
AST: 21 U/L (ref 15–41)
Albumin: 4.5 g/dL (ref 3.5–5.0)
Alkaline Phosphatase: 77 U/L (ref 38–126)
Anion gap: 13 (ref 5–15)
BUN: 13 mg/dL (ref 6–20)
CO2: 23 mmol/L (ref 22–32)
Calcium: 9.6 mg/dL (ref 8.9–10.3)
Chloride: 101 mmol/L (ref 98–111)
Creatinine, Ser: 0.48 mg/dL (ref 0.44–1.00)
GFR calc Af Amer: >60 mL/min (ref >60)
GFR calc non Af Amer: >60 mL/min (ref >60)
Glucose, Bld: 155 mg/dL — ABNORMAL HIGH (ref 70–99)
Potassium: 3.2 mmol/L — ABNORMAL LOW (ref 3.5–5.1)
Sodium: 137 mmol/L (ref 135–145)
Total Bilirubin: 0.5 mg/dL (ref 0.3–1.2)
Total Protein: 9 g/dL — ABNORMAL HIGH (ref 6.5–8.1)

## 2019-01-05 LAB — CBC
HCT: 40.8 % (ref 36.0–46.0)
Hemoglobin: 12.4 g/dL (ref 12.0–15.0)
MCH: 26.8 pg (ref 26.0–34.0)
MCHC: 30.4 g/dL (ref 30.0–36.0)
MCV: 88.3 fL (ref 80.0–100.0)
Platelets: 383 10*3/uL (ref 150–400)
RBC: 4.62 MIL/uL (ref 3.87–5.11)
RDW: 15.6 % — AB (ref 11.5–15.5)
WBC: 13.8 10*3/uL — ABNORMAL HIGH (ref 4.0–10.5)
nRBC: 0 % (ref 0.0–0.2)

## 2019-01-05 LAB — I-STAT TROPONIN, ED: Troponin i, poc: 0 ng/mL (ref 0.00–0.08)

## 2019-01-05 LAB — URINALYSIS, ROUTINE W REFLEX MICROSCOPIC
Bacteria, UA: NONE SEEN
Bilirubin Urine: NEGATIVE
Glucose, UA: 50 mg/dL — AB
Ketones, ur: 20 mg/dL — AB
Leukocytes, UA: NEGATIVE
Nitrite: NEGATIVE
Protein, ur: 30 mg/dL — AB
Specific Gravity, Urine: 1.017 (ref 1.005–1.030)
pH: 8 (ref 5.0–8.0)

## 2019-01-05 LAB — D-DIMER, QUANTITATIVE: D-Dimer, Quant: 1.41 ug/mL-FEU — ABNORMAL HIGH (ref 0.00–0.50)

## 2019-01-05 LAB — LIPASE, BLOOD: LIPASE: 26 U/L (ref 11–51)

## 2019-01-05 MED ORDER — MORPHINE SULFATE (PF) 4 MG/ML IV SOLN
4.0000 mg | Freq: Once | INTRAVENOUS | Status: AC
  Administered 2019-01-05: 4 mg via INTRAVENOUS
  Filled 2019-01-05: qty 1

## 2019-01-05 MED ORDER — IOPAMIDOL (ISOVUE-370) INJECTION 76%
100.0000 mL | Freq: Once | INTRAVENOUS | Status: AC | PRN
  Administered 2019-01-05: 100 mL via INTRAVENOUS

## 2019-01-05 MED ORDER — ONDANSETRON HCL 4 MG/2ML IJ SOLN
4.0000 mg | Freq: Once | INTRAMUSCULAR | Status: AC
  Administered 2019-01-05: 4 mg via INTRAVENOUS
  Filled 2019-01-05: qty 2

## 2019-01-05 MED ORDER — SODIUM CHLORIDE (PF) 0.9 % IJ SOLN
INTRAMUSCULAR | Status: AC
  Filled 2019-01-05: qty 50

## 2019-01-05 MED ORDER — SODIUM CHLORIDE 0.9 % IV BOLUS
1000.0000 mL | Freq: Once | INTRAVENOUS | Status: AC
  Administered 2019-01-05: 1000 mL via INTRAVENOUS

## 2019-01-05 MED ORDER — POTASSIUM CHLORIDE CRYS ER 20 MEQ PO TBCR
40.0000 meq | EXTENDED_RELEASE_TABLET | Freq: Once | ORAL | Status: AC
  Administered 2019-01-05: 40 meq via ORAL
  Filled 2019-01-05: qty 2

## 2019-01-05 MED ORDER — SODIUM CHLORIDE 0.9% FLUSH
3.0000 mL | Freq: Once | INTRAVENOUS | Status: AC
  Administered 2019-01-05: 3 mL via INTRAVENOUS

## 2019-01-05 MED ORDER — BUPIVACAINE LIPOSOME 1.3 % IJ SUSP
20.0000 mL | Freq: Once | INTRAMUSCULAR | Status: DC
  Filled 2019-01-05: qty 20

## 2019-01-05 MED ORDER — IOPAMIDOL (ISOVUE-370) INJECTION 76%
INTRAVENOUS | Status: AC
  Filled 2019-01-05: qty 100

## 2019-01-05 NOTE — ED Notes (Signed)
Pt has asked for pain meds due to her chest pain.

## 2019-01-05 NOTE — ED Notes (Signed)
Patient transported to X-ray 

## 2019-01-05 NOTE — ED Provider Notes (Signed)
[facility] DEPT Provider Note   CSN: [id number] Arrival date & time: 01/05/19  1933     History   Chief Complaint Chief Complaint  Patient presents with  . Abdominal Pain  . Chest Pain    HPI Danielle Villa is a 58 y.o. female.  HPI  58 year old female presents with severe chest pain and back pain.  Started around 5 PM tonight.  She is due for surgery for her diverticular disease tomorrow and has been taking Dulcolax and antibiotics.  She thinks that all of the diarrhea has elicited this pain.  It is sharp and goes through to her right back.  No cough or dyspnea.  It is worse with taking deep breaths.  It is severe.  She also has a little bit of lower abdominal pain and has noticed a little bit of blood when going to the bathroom.  Past Medical History:  Diagnosis Date  . Anxiety   . Arthritis   . Back pain   . Depression   . Headache     Patient Active Problem List   Diagnosis Date Noted  . Diverticulitis of colon 11/26/2018  . Headache 11/26/2018  . Abdominal pain, epigastric 08/20/2018  . Pain of both hip joints 08/06/2018  . TMJ (temporomandibular joint disorder) 08/03/2018  . Ringworm 05/17/2018  . OA (osteoarthritis) of hip 03/15/2018  . Sleep-wake cycle disorder 03/15/2018  . Trochanteric bursitis of both hips 12/19/2017  . Abdominal pain, left lower quadrant 11/28/2017  . Encounter for disability assessment 11/13/2017  . Chronic hand pain, right 10/16/2017  . Hypertrophic lichen planus 51/01/5851  . Mood disturbance 09/07/2017  . Left knee pain 07/04/2017  . Constipation 05/17/2017  . POLYURIA 12/29/2009  . ONYCHOMYCOSIS, TOENAILS 02/01/2009  . FIBROIDS, UTERUS 04/29/2008  . MENORRHAGIA 03/06/2008  . HYPERCHOLESTEROLEMIA 02/07/2007  . PANIC ATTACKS 02/07/2007  . BACK PAIN, LOW 02/07/2007  . Incontinence of feces 02/07/2007    Past Surgical History:  Procedure Laterality Date  . cyst removed from ovary     .  TONSILLECTOMY       OB History    Gravida  5   Para  4   Term  4   Preterm      AB      Living  4     SAB      TAB      Ectopic      Multiple      Live Births  4            Home Medications    Prior to Admission medications   Medication Sig Start Date End Date Taking? Authorizing Provider  aspirin (ASPIRIN EC) 81 MG EC tablet Take 1 tablet (81 mg total) by mouth daily. Patient taking differently: Take 81 mg by mouth at bedtime.  09/17/18   Sela Hilding, MD  atorvastatin (LIPITOR) 20 MG tablet Take 1 tablet (20 mg total) by mouth every evening. 09/17/18   Sela Hilding, MD  Calcium Carbonate-Vitamin D (CALTRATE 600+D) 600-400 MG-UNIT tablet Take 1 tablet by mouth daily. Patient not taking: Reported on 12/31/2018 11/06/17   Lovenia Kim, MD  Melatonin 2.5 MG CAPS Take 1 capsule (2.5 mg total) by mouth at bedtime. Patient not taking: Reported on 12/31/2018 03/13/18   Sela Hilding, MD  pantoprazole (PROTONIX) 20 MG tablet Take 1 tablet (20 mg total) by mouth 2 (two) times daily. 08/04/18   Lacretia Leigh, MD  PARoxetine (PAXIL) 20 MG tablet Take  1 tablet (20 mg total) by mouth daily. 10/02/18   Arfeen, Arlyce Harman, MD  traMADol (ULTRAM) 50 MG tablet Take 1 tablet (50 mg total) by mouth every 6 (six) hours as needed. 01/01/19   Sela Hilding, MD  traZODone (DESYREL) 50 MG tablet Take 1 tablet (50 mg total) by mouth at bedtime as needed. for sleep Patient taking differently: Take 50 mg by mouth at bedtime. for sleep 10/02/18   Arfeen, Arlyce Harman, MD    Family History Family History  Problem Relation Age of Onset  . Hypertension Mother   . Diabetes Mother   . Hypertension Maternal Grandmother     Social History Social History   Tobacco Use  . Smoking status: Former Research scientist (life sciences)  . Smokeless tobacco: Never Used  Substance Use Topics  . Alcohol use: Yes    Comment: occasionaly  . Drug use: No     Allergies   Other   Review of Systems Review of  Systems  Constitutional: Negative for fever.  Respiratory: Negative for cough and shortness of breath.   Cardiovascular: Positive for chest pain.  Gastrointestinal: Positive for abdominal pain, blood in stool, diarrhea and nausea. Negative for vomiting.  Musculoskeletal: Positive for back pain.  All other systems reviewed and are negative.    Physical Exam Updated Vital Signs BP 114/68 (BP Location: Left Arm)   Pulse 90   Temp 98.4 F (36.9 C) (Oral)   Resp 19   Ht 5\' 2"  (1.575 m)   Wt 55.3 kg   SpO2 99%   BMI 22.31 kg/m   Physical Exam Vitals signs and nursing note reviewed.  Constitutional:      Appearance: She is well-developed. She is not ill-appearing or diaphoretic.  HENT:     Head: Normocephalic and atraumatic.     Right Ear: External ear normal.     Left Ear: External ear normal.     Nose: Nose normal.  Eyes:     General:        Right eye: No discharge.        Left eye: No discharge.  Cardiovascular:     Rate and Rhythm: Normal rate and regular rhythm.     Heart sounds: Normal heart sounds.  Pulmonary:     Effort: Pulmonary effort is normal.     Breath sounds: Normal breath sounds.  Chest:     Chest wall: Tenderness present.    Abdominal:     Palpations: Abdomen is soft.     Tenderness: There is abdominal tenderness (mild, generalized, worst in lower abdomen).  Musculoskeletal:     Thoracic back: She exhibits tenderness.       Back:  Skin:    General: Skin is warm and dry.     Findings: No rash.  Neurological:     Mental Status: She is alert.  Psychiatric:        Mood and Affect: Mood is not anxious.      ED Treatments / Results  Labs (all labs ordered are listed, but only abnormal results are displayed) Labs Reviewed  COMPREHENSIVE METABOLIC PANEL - Abnormal; Notable for the following components:      Result Value   Potassium 3.2 (*)    Glucose, Bld 155 (*)    Total Protein 9.0 (*)    All other components within normal limits  CBC -  Abnormal; Notable for the following components:   WBC 13.8 (*)    RDW 15.6 (*)    All other components  within normal limits  URINALYSIS, ROUTINE W REFLEX MICROSCOPIC - Abnormal; Notable for the following components:   Glucose, UA 50 (*)    Hgb urine dipstick SMALL (*)    Ketones, ur 20 (*)    Protein, ur 30 (*)    All other components within normal limits  D-DIMER, QUANTITATIVE (NOT AT [facility]) - Abnormal; Notable for the following components:   D-Dimer, Quant 1.41 (*)    All other components within normal limits  LIPASE, BLOOD  I-STAT TROPONIN, ED  I-STAT TROPONIN, ED    EKG EKG Interpretation  Date/Time:  Sunday January 05 2019 20:45:14 EST Ventricular Rate:  62 PR Interval:    QRS Duration: 105 QT Interval:  405 QTC Calculation: 412 R Axis:   55 Text Interpretation:  Sinus rhythm Artifact no acute ST/T changes Confirmed by Sherwood Gambler [phone]) on 01/05/2019 8:50:15 PM   EKG Interpretation  Date/Time:  Monday January 06 2019 00:16:22 EST Ventricular Rate:  81 PR Interval:    QRS Duration: 91 QT Interval:  401 QTC Calculation: 466 R Axis:   31 Text Interpretation:  Sinus rhythm no acute ST/T changes no significant change since yesterday. Confirmed by Sherwood Gambler [phone]) on 01/06/2019 12:19:11 AM        Radiology Dg Chest 2 View  Result Date: 01/05/2019 CLINICAL DATA:  Chest and back pain EXAM: CHEST - 2 VIEW COMPARISON:  08/16/2018 FINDINGS: The heart size and mediastinal contours are within normal limits. Both lungs are clear. The visualized skeletal structures are unremarkable. IMPRESSION: No active cardiopulmonary disease. Electronically Signed   By: Donavan Foil M.D.   On: 01/05/2019 21:30   Ct Angio Chest Pe W And/or Wo Contrast  Result Date: 01/05/2019 CLINICAL DATA:  Sharp chest pain. Elevated D-dimer. Abdominal pain. Diverticulitis. EXAM: CT ANGIOGRAPHY CHEST CT ABDOMEN AND PELVIS WITH CONTRAST TECHNIQUE: Multidetector CT imaging of the chest was  performed using the standard protocol during bolus administration of intravenous contrast. Multiplanar CT image reconstructions and MIPs were obtained to evaluate the vascular anatomy. Multidetector CT imaging of the abdomen and pelvis was performed using the standard protocol during bolus administration of intravenous contrast. CONTRAST:  149mL ISOVUE-370 IOPAMIDOL (ISOVUE-370) INJECTION 76% COMPARISON:  Body CT 12/06/2018 FINDINGS: CTA CHEST FINDINGS Cardiovascular: Satisfactory opacification of the pulmonary arteries to the segmental level. No evidence of pulmonary embolism. Normal heart size. No pericardial effusion. Mediastinum/Nodes: No enlarged mediastinal, hilar, or axillary lymph nodes. Thyroid gland, and trachea demonstrate no significant findings. Diffuse dilation of the esophagus. Lungs/Pleura: Lungs are clear. No pleural effusion or pneumothorax. Review of the MIP images confirms the above findings. CT ABDOMEN and PELVIS FINDINGS Hepatobiliary: No focal liver abnormality is seen. No gallstones, gallbladder wall thickening, or biliary dilatation. Pancreas: Unremarkable. No pancreatic ductal dilatation or surrounding inflammatory changes. Spleen: Normal in size without focal abnormality. Adrenals/Urinary Tract: Adrenal glands are unremarkable. Kidneys are without renal calculi, focal lesion, or hydronephrosis. Right renal cyst noted. Bladder is unremarkable. Stomach/Bowel: Stomach is within normal limits. No evidence of appendicitis. No evidence of small bowel wall thickening, distention, or inflammatory changes. Diffuse wall thickening of the sigmoid colon with multiple diverticula. Mild pericolonic inflammatory changes. Vascular/Lymphatic: No significant vascular findings are present. No enlarged abdominal or pelvic lymph nodes. Shotty mesenteric lymph nodes surround the sigmoid colon. Reproductive: Leiomyomatous uterus. Other: Small amount of pelvic ascites. Musculoskeletal: No acute or significant  osseous findings. Review of the MIP images confirms the above findings. IMPRESSION: No evidence of pulmonary embolus or other significant abnormality  within the thorax. No evidence of significant abnormalities within the solid abdominal organs. Relatively long segment of irregular mucosal thickening of the sigmoid colon with multiple diverticula and pericolonic inflammatory changes. The findings may represent acute on chronic diverticulitis, however malignancy can not be excluded. Please correlate to the results of colonoscopy. Electronically Signed   By: Fidela Salisbury M.D.   On: 01/05/2019 23:22   Ct Abdomen Pelvis W Contrast  Result Date: 01/05/2019 CLINICAL DATA:  Sharp chest pain. Elevated D-dimer. Abdominal pain. Diverticulitis. EXAM: CT ANGIOGRAPHY CHEST CT ABDOMEN AND PELVIS WITH CONTRAST TECHNIQUE: Multidetector CT imaging of the chest was performed using the standard protocol during bolus administration of intravenous contrast. Multiplanar CT image reconstructions and MIPs were obtained to evaluate the vascular anatomy. Multidetector CT imaging of the abdomen and pelvis was performed using the standard protocol during bolus administration of intravenous contrast. CONTRAST:  122mL ISOVUE-370 IOPAMIDOL (ISOVUE-370) INJECTION 76% COMPARISON:  Body CT 12/06/2018 FINDINGS: CTA CHEST FINDINGS Cardiovascular: Satisfactory opacification of the pulmonary arteries to the segmental level. No evidence of pulmonary embolism. Normal heart size. No pericardial effusion. Mediastinum/Nodes: No enlarged mediastinal, hilar, or axillary lymph nodes. Thyroid gland, and trachea demonstrate no significant findings. Diffuse dilation of the esophagus. Lungs/Pleura: Lungs are clear. No pleural effusion or pneumothorax. Review of the MIP images confirms the above findings. CT ABDOMEN and PELVIS FINDINGS Hepatobiliary: No focal liver abnormality is seen. No gallstones, gallbladder wall thickening, or biliary dilatation.  Pancreas: Unremarkable. No pancreatic ductal dilatation or surrounding inflammatory changes. Spleen: Normal in size without focal abnormality. Adrenals/Urinary Tract: Adrenal glands are unremarkable. Kidneys are without renal calculi, focal lesion, or hydronephrosis. Right renal cyst noted. Bladder is unremarkable. Stomach/Bowel: Stomach is within normal limits. No evidence of appendicitis. No evidence of small bowel wall thickening, distention, or inflammatory changes. Diffuse wall thickening of the sigmoid colon with multiple diverticula. Mild pericolonic inflammatory changes. Vascular/Lymphatic: No significant vascular findings are present. No enlarged abdominal or pelvic lymph nodes. Shotty mesenteric lymph nodes surround the sigmoid colon. Reproductive: Leiomyomatous uterus. Other: Small amount of pelvic ascites. Musculoskeletal: No acute or significant osseous findings. Review of the MIP images confirms the above findings. IMPRESSION: No evidence of pulmonary embolus or other significant abnormality within the thorax. No evidence of significant abnormalities within the solid abdominal organs. Relatively long segment of irregular mucosal thickening of the sigmoid colon with multiple diverticula and pericolonic inflammatory changes. The findings may represent acute on chronic diverticulitis, however malignancy can not be excluded. Please correlate to the results of colonoscopy. Electronically Signed   By: Fidela Salisbury M.D.   On: 01/05/2019 23:22    Procedures Procedures (including critical care time)  Medications Ordered in ED Medications  sodium chloride (PF) 0.9 % injection (has no administration in time range)  iopamidol (ISOVUE-370) 76 % injection (has no administration in time range)  sodium chloride flush (NS) 0.9 % injection 3 mL (3 mLs Intravenous Given 01/05/19 2100)  ondansetron (ZOFRAN) injection 4 mg (4 mg Intravenous Given 01/05/19 2118)  sodium chloride 0.9 % bolus 1,000 mL (1,000  mLs Intravenous New Bag/Given 01/05/19 2058)  morphine 4 MG/ML injection 4 mg (4 mg Intravenous Given 01/05/19 2120)  potassium chloride SA (K-DUR,KLOR-CON) CR tablet 40 mEq (40 mEq Oral Given 01/05/19 2251)  iopamidol (ISOVUE-370) 76 % injection 100 mL (100 mLs Intravenous Contrast Given 01/05/19 2225)     Initial Impression / Assessment and Plan / ED Course  I have reviewed the triage vital signs and the nursing notes.  Pertinent labs & imaging results that were available during my care of the patient were reviewed by me and considered in my medical decision making (see chart for details).     Patient's chest pain is pretty atypical.  Work-up for PE obtained given pleuritic nature and age and this is negative for acute PE.  Given her lower abdominal tenderness, CT obtained but it shows essentially stable diverticulitis.  She states this is the reason she is getting the surgery on 1/27.  I discussed with Dr. Ninfa Linden and given the findings there is no reason she could not have surgery tomorrow.  Given the acute nature of her chest pain tonight, second troponin obtained as well as second EKG.  There are no dynamic ECG changes.  She has mild hypokalemia, probably from the diarrhea.  Likely all of this is from feeling poorly from the premedication for the surgery.  She appears stable for discharge home to come back for her surgery in the morning.  Final Clinical Impressions(s) / ED Diagnoses   Final diagnoses:  Chest wall pain  Hypokalemia    ED Discharge Orders    None       Sherwood Gambler, MD 01/06/19 [phone]

## 2019-01-05 NOTE — ED Notes (Signed)
Bed: MY11 Expected date:  Expected time:  Means of arrival:  Comments: EMS 58 yo female for surgery tomorrow for diverticulitis-abd pain radiating to chest

## 2019-01-05 NOTE — ED Notes (Signed)
Patient transported to CT 

## 2019-01-05 NOTE — ED Notes (Signed)
ED Provider at bedside. 

## 2019-01-05 NOTE — ED Triage Notes (Signed)
Patient presents by [facility] with complaints of abdominal pain radiating into chest. Patient is scheduled for surgery here tomorrow for diverticulitis. Laxatives started 6 hours ago and chest pain 5 hours ago-described as burning sensation upper chest and throat when lying down. Lower abdominal pain throughout evening and states blood tinged stools. Patient took ibuprofen 2 hours ago-400 mg. Patient is concerned about her chest pain and wants to be evaluated.

## 2019-01-06 ENCOUNTER — Encounter (HOSPITAL_COMMUNITY)
Admission: RE | Disposition: A | Discharge status: Home / Self Care | Payer: Self-pay | Source: Home / Self Care | Attending: Surgery

## 2019-01-06 ENCOUNTER — Other Ambulatory Visit: Payer: Self-pay

## 2019-01-06 ENCOUNTER — Inpatient Hospital Stay (HOSPITAL_COMMUNITY): Payer: Medicaid Other | Admitting: Anesthesiology

## 2019-01-06 ENCOUNTER — Inpatient Hospital Stay (HOSPITAL_COMMUNITY): Payer: Medicaid Other

## 2019-01-06 ENCOUNTER — Inpatient Hospital Stay (HOSPITAL_COMMUNITY)
Admission: RE | Admit: 2019-01-06 | Discharge: 2019-01-11 | DRG: 329 | Disposition: A | Discharge status: Home / Self Care | Payer: Medicaid Other | Attending: Surgery | Admitting: Surgery

## 2019-01-06 ENCOUNTER — Encounter (HOSPITAL_COMMUNITY): Payer: Self-pay | Admitting: *Deleted

## 2019-01-06 DIAGNOSIS — E7800 Pure hypercholesterolemia, unspecified: Secondary | ICD-10-CM | POA: Diagnosis not present

## 2019-01-06 DIAGNOSIS — D72829 Elevated white blood cell count, unspecified: Secondary | ICD-10-CM | POA: Diagnosis present

## 2019-01-06 DIAGNOSIS — J9811 Atelectasis: Secondary | ICD-10-CM | POA: Diagnosis not present

## 2019-01-06 DIAGNOSIS — Z7982 Long term (current) use of aspirin: Secondary | ICD-10-CM | POA: Diagnosis not present

## 2019-01-06 DIAGNOSIS — Z79891 Long term (current) use of opiate analgesic: Secondary | ICD-10-CM

## 2019-01-06 DIAGNOSIS — Z5331 Laparoscopic surgical procedure converted to open procedure: Secondary | ICD-10-CM | POA: Diagnosis not present

## 2019-01-06 DIAGNOSIS — K6819 Other retroperitoneal abscess: Secondary | ICD-10-CM | POA: Diagnosis not present

## 2019-01-06 DIAGNOSIS — R319 Hematuria, unspecified: Secondary | ICD-10-CM | POA: Diagnosis present

## 2019-01-06 DIAGNOSIS — R0789 Other chest pain: Secondary | ICD-10-CM | POA: Diagnosis not present

## 2019-01-06 DIAGNOSIS — F329 Major depressive disorder, single episode, unspecified: Secondary | ICD-10-CM | POA: Diagnosis present

## 2019-01-06 DIAGNOSIS — Z79890 Hormone replacement therapy (postmenopausal): Secondary | ICD-10-CM | POA: Diagnosis not present

## 2019-01-06 DIAGNOSIS — I959 Hypotension, unspecified: Secondary | ICD-10-CM | POA: Diagnosis present

## 2019-01-06 DIAGNOSIS — E876 Hypokalemia: Secondary | ICD-10-CM | POA: Diagnosis not present

## 2019-01-06 DIAGNOSIS — F419 Anxiety disorder, unspecified: Secondary | ICD-10-CM | POA: Diagnosis present

## 2019-01-06 DIAGNOSIS — Z87891 Personal history of nicotine dependence: Secondary | ICD-10-CM

## 2019-01-06 DIAGNOSIS — Z8249 Family history of ischemic heart disease and other diseases of the circulatory system: Secondary | ICD-10-CM

## 2019-01-06 DIAGNOSIS — K5732 Diverticulitis of large intestine without perforation or abscess without bleeding: Secondary | ICD-10-CM | POA: Diagnosis not present

## 2019-01-06 DIAGNOSIS — K660 Peritoneal adhesions (postprocedural) (postinfection): Secondary | ICD-10-CM | POA: Diagnosis not present

## 2019-01-06 DIAGNOSIS — G4720 Circadian rhythm sleep disorder, unspecified type: Secondary | ICD-10-CM | POA: Diagnosis present

## 2019-01-06 DIAGNOSIS — Z7689 Persons encountering health services in other specified circumstances: Secondary | ICD-10-CM | POA: Diagnosis not present

## 2019-01-06 DIAGNOSIS — Z79899 Other long term (current) drug therapy: Secondary | ICD-10-CM | POA: Diagnosis not present

## 2019-01-06 DIAGNOSIS — K5792 Diverticulitis of intestine, part unspecified, without perforation or abscess without bleeding: Secondary | ICD-10-CM | POA: Diagnosis present

## 2019-01-06 DIAGNOSIS — K689 Other disorders of retroperitoneum: Secondary | ICD-10-CM | POA: Diagnosis not present

## 2019-01-06 DIAGNOSIS — R079 Chest pain, unspecified: Secondary | ICD-10-CM | POA: Diagnosis not present

## 2019-01-06 DIAGNOSIS — Z408 Encounter for other prophylactic surgery: Secondary | ICD-10-CM | POA: Diagnosis not present

## 2019-01-06 DIAGNOSIS — Z0271 Encounter for disability determination: Secondary | ICD-10-CM | POA: Diagnosis present

## 2019-01-06 DIAGNOSIS — D62 Acute posthemorrhagic anemia: Secondary | ICD-10-CM | POA: Diagnosis not present

## 2019-01-06 DIAGNOSIS — R7989 Other specified abnormal findings of blood chemistry: Secondary | ICD-10-CM | POA: Diagnosis not present

## 2019-01-06 DIAGNOSIS — Z833 Family history of diabetes mellitus: Secondary | ICD-10-CM

## 2019-01-06 DIAGNOSIS — Z531 Procedure and treatment not carried out because of patient's decision for reasons of belief and group pressure: Secondary | ICD-10-CM | POA: Diagnosis not present

## 2019-01-06 DIAGNOSIS — N7092 Oophoritis, unspecified: Secondary | ICD-10-CM | POA: Diagnosis not present

## 2019-01-06 HISTORY — PX: CYSTOSCOPY WITH STENT PLACEMENT: SHX5790

## 2019-01-06 HISTORY — PX: OOPHORECTOMY: SHX6387

## 2019-01-06 HISTORY — PX: APPLICATION OF WOUND VAC: SHX5189

## 2019-01-06 HISTORY — PX: LAPAROSCOPIC SIGMOID COLECTOMY: SHX5928

## 2019-01-06 HISTORY — PX: PROCTOSCOPY: SHX2266

## 2019-01-06 LAB — I-STAT TROPONIN, ED: Troponin i, poc: 0 ng/mL (ref 0.00–0.08)

## 2019-01-06 SURGERY — COLECTOMY, SIGMOID, LAPAROSCOPIC
Anesthesia: General | Site: Urethra

## 2019-01-06 MED ORDER — DEXAMETHASONE SODIUM PHOSPHATE 10 MG/ML IJ SOLN
INTRAMUSCULAR | Status: AC
  Filled 2019-01-06: qty 1

## 2019-01-06 MED ORDER — HYDROMORPHONE HCL 1 MG/ML IJ SOLN
0.2500 mg | INTRAMUSCULAR | Status: DC | PRN
  Administered 2019-01-06: 0.5 mg via INTRAVENOUS

## 2019-01-06 MED ORDER — ONDANSETRON HCL 4 MG/2ML IJ SOLN
INTRAMUSCULAR | Status: DC | PRN
  Administered 2019-01-06: 4 mg via INTRAVENOUS

## 2019-01-06 MED ORDER — ALVIMOPAN 12 MG PO CAPS
12.0000 mg | ORAL_CAPSULE | ORAL | Status: AC
  Administered 2019-01-06: 12 mg via ORAL
  Filled 2019-01-06: qty 1

## 2019-01-06 MED ORDER — ROCURONIUM BROMIDE 100 MG/10ML IV SOLN
INTRAVENOUS | Status: DC | PRN
  Administered 2019-01-06: 20 mg via INTRAVENOUS
  Administered 2019-01-06 (×3): 10 mg via INTRAVENOUS
  Administered 2019-01-06: 20 mg via INTRAVENOUS
  Administered 2019-01-06: 60 mg via INTRAVENOUS

## 2019-01-06 MED ORDER — PHENYLEPHRINE 40 MCG/ML (10ML) SYRINGE FOR IV PUSH (FOR BLOOD PRESSURE SUPPORT)
PREFILLED_SYRINGE | INTRAVENOUS | Status: AC
  Filled 2019-01-06: qty 10

## 2019-01-06 MED ORDER — LIDOCAINE HCL 2 % IJ SOLN
INTRAMUSCULAR | Status: AC
  Filled 2019-01-06: qty 20

## 2019-01-06 MED ORDER — BUPIVACAINE-EPINEPHRINE 0.25% -1:200000 IJ SOLN
INTRAMUSCULAR | Status: DC | PRN
  Administered 2019-01-06: 30 mL

## 2019-01-06 MED ORDER — METRONIDAZOLE 500 MG PO TABS: 1000.0000 mg | ORAL_TABLET | ORAL | Status: DC

## 2019-01-06 MED ORDER — PROPOFOL 10 MG/ML IV BOLUS
INTRAVENOUS | Status: AC
  Filled 2019-01-06: qty 20

## 2019-01-06 MED ORDER — DIPHENHYDRAMINE HCL 50 MG/ML IJ SOLN: 25.0000 mg | Freq: Four times a day (QID) | INTRAMUSCULAR | Status: DC | PRN

## 2019-01-06 MED ORDER — BUPIVACAINE LIPOSOME 1.3 % IJ SUSP
INTRAMUSCULAR | Status: DC | PRN
  Administered 2019-01-06: 20 mL

## 2019-01-06 MED ORDER — SODIUM CHLORIDE 0.9 % IV SOLN
2.0000 g | Freq: Two times a day (BID) | INTRAVENOUS | Status: AC
  Administered 2019-01-07: 2 g via INTRAVENOUS
  Filled 2019-01-06: qty 2

## 2019-01-06 MED ORDER — ENSURE SURGERY PO LIQD
237.0000 mL | Freq: Two times a day (BID) | ORAL | Status: DC
  Administered 2019-01-07 – 2019-01-08 (×3): 237 mL via ORAL
  Filled 2019-01-06 (×5): qty 237

## 2019-01-06 MED ORDER — CHLORHEXIDINE GLUCONATE 4 % EX LIQD: 60.0000 mL | Freq: Once | CUTANEOUS | Status: DC

## 2019-01-06 MED ORDER — METHOCARBAMOL 500 MG IVPB - SIMPLE MED
500.0000 mg | Freq: Four times a day (QID) | INTRAVENOUS | Status: DC | PRN
  Administered 2019-01-06: 500 mg via INTRAVENOUS
  Filled 2019-01-06 (×2): qty 50

## 2019-01-06 MED ORDER — MIDAZOLAM HCL 2 MG/2ML IJ SOLN
INTRAMUSCULAR | Status: DC | PRN
  Administered 2019-01-06: 2 mg via INTRAVENOUS

## 2019-01-06 MED ORDER — FENTANYL CITRATE (PF) 250 MCG/5ML IJ SOLN
INTRAMUSCULAR | Status: AC
  Filled 2019-01-06: qty 5

## 2019-01-06 MED ORDER — TRAZODONE HCL 50 MG PO TABS
50.0000 mg | ORAL_TABLET | Freq: Every day | ORAL | Status: DC
  Administered 2019-01-06 – 2019-01-10 (×5): 50 mg via ORAL
  Filled 2019-01-06 (×5): qty 1

## 2019-01-06 MED ORDER — SACCHAROMYCES BOULARDII 250 MG PO CAPS
250.0000 mg | ORAL_CAPSULE | Freq: Two times a day (BID) | ORAL | Status: DC
  Administered 2019-01-06 – 2019-01-11 (×10): 250 mg via ORAL
  Filled 2019-01-06 (×10): qty 1

## 2019-01-06 MED ORDER — ONDANSETRON HCL 4 MG/2ML IJ SOLN
4.0000 mg | Freq: Four times a day (QID) | INTRAMUSCULAR | Status: DC | PRN
  Administered 2019-01-07: 4 mg via INTRAVENOUS
  Filled 2019-01-06: qty 2

## 2019-01-06 MED ORDER — HYDRALAZINE HCL 20 MG/ML IJ SOLN: 10.0000 mg | INTRAMUSCULAR | Status: DC | PRN

## 2019-01-06 MED ORDER — HYDROMORPHONE HCL 1 MG/ML IJ SOLN
INTRAMUSCULAR | Status: AC
  Filled 2019-01-06: qty 2

## 2019-01-06 MED ORDER — TRAMADOL HCL 50 MG PO TABS
50.0000 mg | ORAL_TABLET | Freq: Four times a day (QID) | ORAL | Status: DC | PRN
  Administered 2019-01-07 – 2019-01-09 (×5): 50 mg via ORAL
  Filled 2019-01-06 (×5): qty 1

## 2019-01-06 MED ORDER — STERILE WATER FOR IRRIGATION IR SOLN
Status: DC | PRN
  Administered 2019-01-06: 3000 mL

## 2019-01-06 MED ORDER — FENTANYL CITRATE (PF) 250 MCG/5ML IJ SOLN
INTRAMUSCULAR | Status: DC | PRN
  Administered 2019-01-06: 50 ug via INTRAVENOUS
  Administered 2019-01-06: 150 ug via INTRAVENOUS
  Administered 2019-01-06 (×3): 50 ug via INTRAVENOUS

## 2019-01-06 MED ORDER — BISACODYL 5 MG PO TBEC: 20.0000 mg | DELAYED_RELEASE_TABLET | Freq: Once | ORAL | Status: DC

## 2019-01-06 MED ORDER — SODIUM CHLORIDE 0.9 % IV SOLN: INTRAVENOUS | Status: DC | PRN

## 2019-01-06 MED ORDER — SUGAMMADEX SODIUM 200 MG/2ML IV SOLN
INTRAVENOUS | Status: AC
  Filled 2019-01-06: qty 2

## 2019-01-06 MED ORDER — ONDANSETRON HCL 4 MG/2ML IJ SOLN
INTRAMUSCULAR | Status: AC
  Filled 2019-01-06: qty 2

## 2019-01-06 MED ORDER — ENOXAPARIN SODIUM 40 MG/0.4ML ~~LOC~~ SOLN
40.0000 mg | SUBCUTANEOUS | Status: DC
  Filled 2019-01-06: qty 0.4

## 2019-01-06 MED ORDER — IOHEXOL 300 MG/ML  SOLN
INTRAMUSCULAR | Status: DC | PRN
  Administered 2019-01-06: 10 mL via INTRAVENOUS

## 2019-01-06 MED ORDER — INDIGOTINDISULFONATE SODIUM 8 MG/ML IJ SOLN
INTRAMUSCULAR | Status: AC
  Filled 2019-01-06: qty 5

## 2019-01-06 MED ORDER — LIDOCAINE 2% (20 MG/ML) 5 ML SYRINGE
INTRAMUSCULAR | Status: AC
  Filled 2019-01-06: qty 5

## 2019-01-06 MED ORDER — PAROXETINE HCL 20 MG PO TABS
20.0000 mg | ORAL_TABLET | Freq: Every day | ORAL | Status: DC
  Administered 2019-01-06 – 2019-01-11 (×6): 20 mg via ORAL
  Filled 2019-01-06 (×6): qty 1

## 2019-01-06 MED ORDER — ACETAMINOPHEN 500 MG PO TABS
1000.0000 mg | ORAL_TABLET | Freq: Four times a day (QID) | ORAL | Status: DC
  Administered 2019-01-06 – 2019-01-09 (×11): 1000 mg via ORAL
  Filled 2019-01-06 (×11): qty 2

## 2019-01-06 MED ORDER — FENTANYL CITRATE (PF) 100 MCG/2ML IJ SOLN
INTRAMUSCULAR | Status: AC
  Filled 2019-01-06: qty 2

## 2019-01-06 MED ORDER — ROCURONIUM BROMIDE 100 MG/10ML IV SOLN
INTRAVENOUS | Status: AC
  Filled 2019-01-06: qty 1

## 2019-01-06 MED ORDER — PHENYLEPHRINE 40 MCG/ML (10ML) SYRINGE FOR IV PUSH (FOR BLOOD PRESSURE SUPPORT)
PREFILLED_SYRINGE | INTRAVENOUS | Status: DC | PRN
  Administered 2019-01-06 (×5): 80 ug via INTRAVENOUS

## 2019-01-06 MED ORDER — BUPIVACAINE-EPINEPHRINE (PF) 0.25% -1:200000 IJ SOLN
INTRAMUSCULAR | Status: AC
  Filled 2019-01-06: qty 30

## 2019-01-06 MED ORDER — LACTATED RINGERS IV SOLN
INTRAVENOUS | Status: DC
  Administered 2019-01-06 (×3): via INTRAVENOUS

## 2019-01-06 MED ORDER — ALVIMOPAN 12 MG PO CAPS
12.0000 mg | ORAL_CAPSULE | Freq: Two times a day (BID) | ORAL | Status: DC
  Filled 2019-01-06: qty 1

## 2019-01-06 MED ORDER — MIDAZOLAM HCL 2 MG/2ML IJ SOLN
INTRAMUSCULAR | Status: AC
  Filled 2019-01-06: qty 2

## 2019-01-06 MED ORDER — NEOMYCIN SULFATE 500 MG PO TABS: 1000.0000 mg | ORAL_TABLET | ORAL | Status: DC

## 2019-01-06 MED ORDER — EPHEDRINE SULFATE-NACL 50-0.9 MG/10ML-% IV SOSY
PREFILLED_SYRINGE | INTRAVENOUS | Status: DC | PRN
  Administered 2019-01-06: 10 mg via INTRAVENOUS

## 2019-01-06 MED ORDER — ALUM & MAG HYDROXIDE-SIMETH 200-200-20 MG/5ML PO SUSP: 30.0000 mL | Freq: Four times a day (QID) | ORAL | Status: DC | PRN

## 2019-01-06 MED ORDER — GABAPENTIN 300 MG PO CAPS
300.0000 mg | ORAL_CAPSULE | ORAL | Status: AC
  Administered 2019-01-06: 300 mg via ORAL
  Filled 2019-01-06: qty 1

## 2019-01-06 MED ORDER — METOCLOPRAMIDE HCL 5 MG/ML IJ SOLN: 10.0000 mg | Freq: Four times a day (QID) | INTRAMUSCULAR | Status: DC | PRN

## 2019-01-06 MED ORDER — LIDOCAINE 2% (20 MG/ML) 5 ML SYRINGE
INTRAMUSCULAR | Status: DC | PRN
  Administered 2019-01-06: 1.5 mg/kg/h via INTRAVENOUS

## 2019-01-06 MED ORDER — PROPOFOL 10 MG/ML IV BOLUS
INTRAVENOUS | Status: DC | PRN
  Administered 2019-01-06: 110 mg via INTRAVENOUS

## 2019-01-06 MED ORDER — HYDROMORPHONE HCL 1 MG/ML IJ SOLN
0.5000 mg | INTRAMUSCULAR | Status: DC | PRN
  Administered 2019-01-06 – 2019-01-09 (×3): 0.5 mg via INTRAVENOUS
  Filled 2019-01-06 (×3): qty 0.5

## 2019-01-06 MED ORDER — SUGAMMADEX SODIUM 200 MG/2ML IV SOLN
INTRAVENOUS | Status: DC | PRN
  Administered 2019-01-06: 200 mg via INTRAVENOUS

## 2019-01-06 MED ORDER — DIPHENHYDRAMINE HCL 25 MG PO CAPS: 25.0000 mg | ORAL_CAPSULE | Freq: Four times a day (QID) | ORAL | Status: DC | PRN

## 2019-01-06 MED ORDER — GABAPENTIN 300 MG PO CAPS
300.0000 mg | ORAL_CAPSULE | Freq: Two times a day (BID) | ORAL | Status: DC
  Administered 2019-01-06: 300 mg via ORAL
  Filled 2019-01-06: qty 1

## 2019-01-06 MED ORDER — OXYCODONE HCL 5 MG PO TABS
5.0000 mg | ORAL_TABLET | ORAL | Status: DC | PRN
  Administered 2019-01-07 – 2019-01-08 (×5): 5 mg via ORAL
  Filled 2019-01-06 (×5): qty 1

## 2019-01-06 MED ORDER — POLYETHYLENE GLYCOL 3350 17 GM/SCOOP PO POWD: 1.0000 | Freq: Once | ORAL | Status: DC

## 2019-01-06 MED ORDER — LACTATED RINGERS IR SOLN
Status: DC | PRN
  Administered 2019-01-06: 1000 mL

## 2019-01-06 MED ORDER — MEPERIDINE HCL 50 MG/ML IJ SOLN: 6.2500 mg | INTRAMUSCULAR | Status: DC | PRN

## 2019-01-06 MED ORDER — KETOROLAC TROMETHAMINE 30 MG/ML IJ SOLN
30.0000 mg | Freq: Four times a day (QID) | INTRAMUSCULAR | Status: DC | PRN
  Administered 2019-01-07: 30 mg via INTRAVENOUS
  Filled 2019-01-06: qty 1

## 2019-01-06 MED ORDER — IOPAMIDOL (ISOVUE-300) INJECTION 61%
INTRAVENOUS | Status: AC
  Filled 2019-01-06: qty 50

## 2019-01-06 MED ORDER — 0.9 % SODIUM CHLORIDE (POUR BTL) OPTIME
TOPICAL | Status: DC | PRN
  Administered 2019-01-06: 3000 mL

## 2019-01-06 MED ORDER — SODIUM CHLORIDE (PF) 0.9 % IJ SOLN
INTRAMUSCULAR | Status: AC
  Filled 2019-01-06: qty 50

## 2019-01-06 MED ORDER — SODIUM CHLORIDE 0.9 % IV SOLN
2.0000 g | INTRAVENOUS | Status: AC
  Administered 2019-01-06: 2 g via INTRAVENOUS
  Filled 2019-01-06: qty 2

## 2019-01-06 MED ORDER — PANTOPRAZOLE SODIUM 20 MG PO TBEC
20.0000 mg | DELAYED_RELEASE_TABLET | Freq: Two times a day (BID) | ORAL | Status: DC
  Administered 2019-01-06 – 2019-01-11 (×10): 20 mg via ORAL
  Filled 2019-01-06 (×11): qty 1

## 2019-01-06 MED ORDER — ONDANSETRON HCL 4 MG PO TABS: 4.0000 mg | ORAL_TABLET | Freq: Four times a day (QID) | ORAL | Status: DC | PRN

## 2019-01-06 MED ORDER — METHOCARBAMOL 500 MG IVPB - SIMPLE MED
INTRAVENOUS | Status: AC
  Filled 2019-01-06: qty 50

## 2019-01-06 MED ORDER — ACETAMINOPHEN 500 MG PO TABS
1000.0000 mg | ORAL_TABLET | ORAL | Status: AC
  Administered 2019-01-06: 1000 mg via ORAL
  Filled 2019-01-06: qty 2

## 2019-01-06 MED ORDER — DEXAMETHASONE SODIUM PHOSPHATE 10 MG/ML IJ SOLN
INTRAMUSCULAR | Status: DC | PRN
  Administered 2019-01-06: 10 mg via INTRAVENOUS

## 2019-01-06 MED ORDER — LIDOCAINE HCL URETHRAL/MUCOSAL 2 % EX GEL
CUTANEOUS | Status: AC
  Filled 2019-01-06: qty 5

## 2019-01-06 MED ORDER — KETOROLAC TROMETHAMINE 30 MG/ML IJ SOLN
15.0000 mg | INTRAMUSCULAR | Status: AC
  Administered 2019-01-06: 15 mg via INTRAVENOUS
  Filled 2019-01-06: qty 1

## 2019-01-06 MED ORDER — KETAMINE HCL 10 MG/ML IJ SOLN
INTRAMUSCULAR | Status: DC | PRN
  Administered 2019-01-06: 30 mg via INTRAVENOUS

## 2019-01-06 MED ORDER — METOPROLOL TARTRATE 5 MG/5ML IV SOLN: 5.0000 mg | Freq: Four times a day (QID) | INTRAVENOUS | Status: DC | PRN

## 2019-01-06 MED ORDER — SODIUM CHLORIDE 0.9 % IV SOLN
INTRAVENOUS | Status: DC
  Administered 2019-01-06 – 2019-01-07 (×3): via INTRAVENOUS

## 2019-01-06 MED ORDER — LIDOCAINE 2% (20 MG/ML) 5 ML SYRINGE
INTRAMUSCULAR | Status: DC | PRN
  Administered 2019-01-06: 100 mg via INTRAVENOUS

## 2019-01-06 MED ORDER — ONDANSETRON HCL 4 MG/2ML IJ SOLN: 4.0000 mg | Freq: Once | INTRAMUSCULAR | Status: DC | PRN

## 2019-01-06 SURGICAL SUPPLY — 91 items
ADAPTER GOLDBERG URETERAL (ADAPTER) ×6 IMPLANT
APPLIER CLIP 5 13 M/L LIGAMAX5 (MISCELLANEOUS)
APPLIER CLIP ROT 10 11.4 M/L (STAPLE)
BAG URO CATCHER STRL LF (MISCELLANEOUS) ×6 IMPLANT
BLADE EXTENDED COATED 6.5IN (ELECTRODE) IMPLANT
CABLE HIGH FREQUENCY MONO STRZ (ELECTRODE) IMPLANT
CATH INTERMIT  6FR 70CM (CATHETERS) ×12 IMPLANT
CELLS DAT CNTRL 66122 CELL SVR (MISCELLANEOUS) IMPLANT
CLIP APPLIE 5 13 M/L LIGAMAX5 (MISCELLANEOUS) IMPLANT
CLIP APPLIE ROT 10 11.4 M/L (STAPLE) IMPLANT
CLIP VESOLOCK LG 6/CT PURPLE (CLIP) ×6 IMPLANT
CLOTH BEACON ORANGE TIMEOUT ST (SAFETY) IMPLANT
COVER SURGICAL LIGHT HANDLE (MISCELLANEOUS) ×12 IMPLANT
COVER WAND RF STERILE (DRAPES) ×6 IMPLANT
DECANTER SPIKE VIAL GLASS SM (MISCELLANEOUS) IMPLANT
DERMABOND ADVANCED (GAUZE/BANDAGES/DRESSINGS)
DERMABOND ADVANCED .7 DNX12 (GAUZE/BANDAGES/DRESSINGS) IMPLANT
DRAIN CHANNEL 19F RND (DRAIN) ×6 IMPLANT
DRESSING PREVENA PLUS CUSTOM (GAUZE/BANDAGES/DRESSINGS) ×4 IMPLANT
DRSG OPSITE POSTOP 4X10 (GAUZE/BANDAGES/DRESSINGS) IMPLANT
DRSG OPSITE POSTOP 4X6 (GAUZE/BANDAGES/DRESSINGS) IMPLANT
DRSG OPSITE POSTOP 4X8 (GAUZE/BANDAGES/DRESSINGS) IMPLANT
DRSG PREVENA PLUS CUSTOM (GAUZE/BANDAGES/DRESSINGS) ×6
DRSG TEGADERM 4X4.75 (GAUZE/BANDAGES/DRESSINGS) ×6 IMPLANT
ELECT PENCIL ROCKER SW 15FT (MISCELLANEOUS) ×6 IMPLANT
ELECT REM PT RETURN 15FT ADLT (MISCELLANEOUS) ×6 IMPLANT
ENDOLOOP SUT PDS II  0 18 (SUTURE)
ENDOLOOP SUT PDS II 0 18 (SUTURE) IMPLANT
EVACUATOR SILICONE 100CC (DRAIN) ×6 IMPLANT
GAUZE SPONGE 4X4 12PLY STRL (GAUZE/BANDAGES/DRESSINGS) IMPLANT
GLOVE BIO SURGEON STRL SZ 6 (GLOVE) ×12 IMPLANT
GLOVE BIO SURGEON STRL SZ7.5 (GLOVE) ×6 IMPLANT
GLOVE INDICATOR 6.5 STRL GRN (GLOVE) ×12 IMPLANT
GOWN STRL REUS W/TWL LRG LVL3 (GOWN DISPOSABLE) ×24 IMPLANT
GOWN STRL REUS W/TWL XL LVL3 (GOWN DISPOSABLE) ×12 IMPLANT
GUIDEWIRE STR DUAL SENSOR (WIRE) ×6 IMPLANT
HOLDER FOLEY CATH W/STRAP (MISCELLANEOUS) ×6 IMPLANT
KIT SIGMOIDOSCOPE (SET/KITS/TRAYS/PACK) ×6 IMPLANT
LIGASURE IMPACT 36 18CM CVD LR (INSTRUMENTS) ×6 IMPLANT
MANIFOLD NEPTUNE II (INSTRUMENTS) ×6 IMPLANT
PACK COLON (CUSTOM PROCEDURE TRAY) ×6 IMPLANT
PACK CYSTO (CUSTOM PROCEDURE TRAY) ×6 IMPLANT
PAD POSITIONING PINK XL (MISCELLANEOUS) ×6 IMPLANT
PORT LAP GEL ALEXIS MED 5-9CM (MISCELLANEOUS) IMPLANT
RELOAD STAPLER BLUE 60MM (STAPLE) ×4 IMPLANT
RELOAD STAPLER WHITE 60MM (STAPLE) IMPLANT
RETRACTOR WND ALEXIS 25 LRG (MISCELLANEOUS) ×4 IMPLANT
RTRCTR WOUND ALEXIS 18CM MED (MISCELLANEOUS)
RTRCTR WOUND ALEXIS 25CM LRG (MISCELLANEOUS) ×6
SCISSORS LAP 5X35 DISP (ENDOMECHANICALS) ×6 IMPLANT
SEPRAFILM PROCEDURAL PACK 3X5 (MISCELLANEOUS) ×6 IMPLANT
SET IRRIG TUBING LAPAROSCOPIC (IRRIGATION / IRRIGATOR) ×6 IMPLANT
SET TUBE SMOKE EVAC HIGH FLOW (TUBING) ×6 IMPLANT
SHEARS HARMONIC ACE PLUS 36CM (ENDOMECHANICALS) ×6 IMPLANT
SLEEVE ADV FIXATION 5X100MM (TROCAR) ×6 IMPLANT
SLEEVE XCEL OPT CAN 5 100 (ENDOMECHANICALS) ×12 IMPLANT
SPONGE DRAIN TRACH 4X4 STRL 2S (GAUZE/BANDAGES/DRESSINGS) ×6 IMPLANT
SPONGE LAP 18X18 RF (DISPOSABLE) ×6 IMPLANT
STAPLER CUT CVD 40MM GREEN (STAPLE) ×6 IMPLANT
STAPLER CUT RELOAD GREEN (STAPLE) ×6 IMPLANT
STAPLER ECHELON LONG 60 440 (INSTRUMENTS) ×6 IMPLANT
STAPLER ECHELON POWER CIR 29 (STAPLE) ×6 IMPLANT
STAPLER RELOAD BLUE 60MM (STAPLE) ×6
STAPLER RELOAD WHITE 60MM (STAPLE)
STAPLER VISISTAT 35W (STAPLE) IMPLANT
SUCTION POOLE TIP (SUCTIONS) ×6 IMPLANT
SUT ETHILON 2 0 PS N (SUTURE) ×6 IMPLANT
SUT PDS AB 0 CT1 36 (SUTURE) IMPLANT
SUT PDS AB 1 CTX 36 (SUTURE) ×12 IMPLANT
SUT PROLENE 2 0 KS (SUTURE) IMPLANT
SUT PROLENE 2 0 SH DA (SUTURE) IMPLANT
SUT SILK 2 0 (SUTURE) ×2
SUT SILK 2 0 SH CR/8 (SUTURE) ×6 IMPLANT
SUT SILK 2-0 18XBRD TIE 12 (SUTURE) ×4 IMPLANT
SUT SILK 3 0 (SUTURE) ×2
SUT SILK 3 0 SH CR/8 (SUTURE) ×6 IMPLANT
SUT SILK 3-0 18XBRD TIE 12 (SUTURE) ×4 IMPLANT
SUT VIC AB 2-0 SH 27 (SUTURE)
SUT VIC AB 2-0 SH 27X BRD (SUTURE) IMPLANT
SUT VIC AB 3-0 SH 27 (SUTURE)
SUT VIC AB 3-0 SH 27XBRD (SUTURE) IMPLANT
SYS LAPSCP GELPORT 120MM (MISCELLANEOUS)
SYSTEM LAPSCP GELPORT 120MM (MISCELLANEOUS) IMPLANT
TOWEL OR NON WOVEN STRL DISP B (DISPOSABLE) ×6 IMPLANT
TRAY FOLEY CATH 14FRSI W/METER (CATHETERS) ×6 IMPLANT
TRAY FOLEY MTR SLVR 16FR STAT (SET/KITS/TRAYS/PACK) IMPLANT
TROCAR ADV FIXATION 5X100MM (TROCAR) ×6 IMPLANT
TROCAR BLADELESS OPT 5 100 (ENDOMECHANICALS) ×6 IMPLANT
TROCAR XCEL 12X100 BLDLESS (ENDOMECHANICALS) IMPLANT
TUBING CONNECTING 10 (TUBING) ×20 IMPLANT
TUBING CONNECTING 10' (TUBING) ×4

## 2019-01-06 NOTE — Anesthesia Preprocedure Evaluation (Signed)
Anesthesia Evaluation  Patient identified by MRN, date of birth, ID band Patient awake    Reviewed: Allergy & Precautions, NPO status , Patient's Chart, lab work & pertinent test results  Airway Mallampati: I  TM Distance: >3 FB Neck ROM: Full    Dental   Pulmonary former smoker,    Pulmonary exam normal        Cardiovascular Normal cardiovascular exam     Neuro/Psych Anxiety Depression    GI/Hepatic   Endo/Other    Renal/GU      Musculoskeletal   Abdominal   Peds  Hematology   Anesthesia Other Findings   Reproductive/Obstetrics                             Anesthesia Physical Anesthesia Plan  ASA: II  Anesthesia Plan: General   Post-op Pain Management:    Induction: Intravenous  PONV Risk Score and Plan: 3 and Ondansetron, Midazolam and Treatment may vary due to age or medical condition  Airway Management Planned: Oral ETT  Additional Equipment:   Intra-op Plan:   Post-operative Plan: Extubation in OR  Informed Consent: I have reviewed the patients History and Physical, chart, labs and discussed the procedure including the risks, benefits and alternatives for the proposed anesthesia with the patient or authorized representative who has indicated his/her understanding and acceptance.       Plan Discussed with: CRNA and Surgeon  Anesthesia Plan Comments:         Anesthesia Quick Evaluation

## 2019-01-06 NOTE — Op Note (Addendum)
Preoperative diagnosis: Diverticulitis  Postoperative diagnosis: Diverticulitis  Procedure: Cystoscopy with bilateral ureteral stent placement and bilateral retrograde pyelography  Surgeon: Pryor Curia MD  Anesthesia: General  Complications: None  EBL: None  Intraoperative findings: Bilateral retrograde pyelography demonstrated no hydronephrosis.  No filling defects or abnormalities were noted in the renal collecting systems or ureters bilaterally.  Indication: Ms. Boultinghouse is a 58 year old female with diverticulitis.  She is scheduled to undergo surgery today per the general surgery team including a probable sigmoid colectomy.  In order to identify the ureters intraoperatively, a urologic consultation was obtained to place ureteral stents preoperatively.  The potential risks, complications, and alternative options were discussed.  Informed consent was obtained.  Description of procedure: The patient was taken to the operating room and a general anesthetic was administered.  She was given preoperative antibiotics, placed in the dorsolithotomy position, and prepped and draped in the usual sterile fashion.  A preoperative timeout was performed.  Cystourethroscopy was performed with the 30 degree lens.  The bladder was examined and there was no evidence of any bladder tumors, stones, or other mucosal pathology.  The ureteral orifice ease were in their expected anatomic location.  Attention then turned to the right ureteral orifice.  A 6 French ureteral catheter was used to intubate the right ureter.  Omnipaque contrast was injected and there was no evidence of any abnormalities of the ureter or renal pelvis.  No hydronephrosis was noted.  A 0.38 sensor guidewire was advanced up into the renal pelvis under fluoroscopic guidance.  The 6 French ureteral catheter was then advanced over the wire and positioned in the renal pelvis and the wire was removed.  An identical procedure was then  performed on the contralateral side.  Again, no abnormalities were noted of the left ureter or renal pelvis and no hydronephrosis was noted.  The patient's bladder was then emptied.  A 16 French Foley catheter was inserted into the bladder.  The ureteral stents were secured to the catheter with a silk suture and they were all connected via a Lyda Kalata adapter.  The procedure was then turned over to Dr. Kae Heller for the remainder of the procedure.

## 2019-01-06 NOTE — Interval H&P Note (Signed)
History and Physical Interval Note:  01/06/2019 11:23 AM  Danielle Villa  has presented today for surgery, with the diagnosis of DIVERTICULITIS  The various methods of treatment have been discussed with the patient and family. After consideration of risks, benefits and other options for treatment, the patient has consented to  Procedure(s): LAPAROSCOPIC VS OPEN SIGMOID COLECTOMY, POSSIBLE OSTOMY (N/A) CYSTOSCOPY WITH STENT PLACEMENT (Bilateral) as a surgical intervention .  The patient's history has been reviewed, patient examined, no change in status, stable for surgery.  I have reviewed the patient's chart and labs.  Questions were answered to the patient's satisfaction.     Slayter Moorhouse Rich Brave

## 2019-01-06 NOTE — Progress Notes (Signed)
Received patient to 1529 via stretcher. Awake and alert. In no distress.  Donne Hazel, RN

## 2019-01-06 NOTE — Op Note (Addendum)
Operative Note  Danielle Villa  [id number]  [id number]  01/06/2019   Surgeon: Victorino Sparrow ConnorMD  Assistant: Adonis Housekeeper MD  Procedure performed: laparoscopic converted to open extended left hemicolectomy  Preop diagnosis: diverticulitis Post-op diagnosis/intraop findings: same, with fibrotic inflammation extending into the retroperitoneum/ tethering the ureter and abscess of the left ovary  Specimens: extended left colectomy, additional rectal specimen, left ovary Retained items: 19 French round Blake drainin the pelvis and left gutter EBL: [id number] Complications: none  Description of procedure: After obtaining informed consent the patient was taken to the operating room and placed supine on operating room table wheregeneral endotracheal anesthesia was initiated, preoperative antibiotics were administered, SCDs applied, and a formal timeout was performed. The patient was then placed in lithotomy position with all pressure points properly padded. Bilateral ureteral stents were inserted by Dr. Alinda Money, please see his separate operative note. The abdomen was then prepped and draped in usual sterile fashion. Peritoneal access was gained using a Visiport technique in the left upper quadrant and insufflation to 15 mmHg ensued without incident. Gross inspection demonstrated no evidence of injury from our entry. Under direct visualization 2 additional 5 mm trochars and a right lower quadrant 12 mm trocar were placed and the patient was placed in Trendelenburg and rotated to the right.  The omentum and transverse colon were lifted and the small bowel was reflected out of the pelvis. There was a thick adhesive band between the distal small bowel and the right ovary which was divided with the Harmonic scalpel. The mesentery of the distal small bowel was also densely adherent to the inflammatory process and left lower quadrant and this was carefully divided using the Harmonic scalpel, which did  create a mesenteric defect in the small bowel mesentery. This was ultimately closed with several interrupted figure-of-eight sutures of 3-0 silk.   The distal sigmoid and proximal to mid rectum were firm and chronically inflamed and fibrotic. The structures were intimately adherent to the posterior uterus, left ovary, and retroperitoneum. Blunt and Harmonic scalpel dissection were used to try and free the colon from the adnexa and while the uterus was able to be freed, the ovary was densely adherent and could not be really divided. During dissection the ovary was disrupted and purulent fluid was evacuated from the left ovary.   The sigmoid was retracted anteriorly and the peritoneum over the medial aspect of the mesentery scored with Harmonic scalpel. The retro-mesenteric plane was essentially obliterated by inflammation at the level of the sigmoid however more proximally we were able to get into this plane to mobilize the descending colon and splenic flexure. The left ureter was visualized with its stent in place and was noted to be intimately adherent to the inflammatory process in the sigmoid mesentery in fact it was retracted up into the mesentery and was very difficult to dissect out. Care was taken to ensure no injury to the ureter.   The IMA pedicle was able to be skeletonized, ligated with hemolock clips and divided.  Proceeded to divide the lateral attachments of the descending colon and mobilized the splenic flexure laparoscopically. At this point I was not able to safely continue trying to dissect out the ureter and the left ovary laparoscopically and really could not make much more progress mobilizing the rectum. A midline laparotomy was created and an Kreamer wound protector placed. Blunt dissection and finger fracturing were used to free the left ovary and to palpate the left ureteral stent ensuring that it stayed  down while remaining adhesions of the sigmoid colon and mesentery posteriorly and  laterally were divided with cautery. The peritoneum on either side of the rectum was then scored with cautery and dissection proceeded to a point where the rectum felt soft and free of disease. This was divided with the green load contour stapler. The descending colon was somewhat tethered by the mesentery, however the transverse colon was quite mobile free, and therefore we elected to divide the colon at the level of the distal transverse where it would easily reach down for anastomosis. The remaining mesentery was divided with the LigaSure ensuring no injury to underlying structures and the specimen extracted.   At this point the small bowel was run from distal to proximal, ensuring no injury or remaining adhesive disease. As previously mentioned the mesenteric defects from prior adhesions were closed with interrupted figure-of-eight 3-0 silks. All the small bowel appeared healthy and well perfused without any further adhesions or obstructing disease. The duodenum was inspected and confirmed to be free of injury.   We returned to the pelvis. The left ovary was distended and full of purulent fluid. This was excised using the LigaSure, leaving the left fallopian tube intact. The anvil of a 58 EEA was placed in the distal colon and secured using the assistance of a pursestring device after excising the staple line. Dr. Excell Seltzer then proceeded to attempt to dilate the rectum and inserted the stapler, however there is noted to be a stricture a few centimeters distal to where we had stapled off the rectum in addition to which the rectum seemed to be impacted with mucus and stool. This was all cleared out, however we still could not permit even a 21 dilator beyond the stricture. We returned to the abdomen and excised another 2 cm of rectum to excise the narrowed component. After this the 29 EEA stapler was able to be introduced easily and the spike was brought out about 5 mm anterior to the rectal staple line and  secured to the anvil, ensuring no twisting of the colon. The stapler was then closed and fired. The donuts were inspected and confirmed to be intact. The anastomosis was free of any tension and appeared healthy and well perfused. A leak test was then performed with the proximal colon clamped manually and irrigation fluid in the pelvis, the colon was insufflated with air using the rigid sigmoidoscope and no bubbles or evidence of leak was identified. The anastomosis was able to be inspected and traversed by Dr. Excell Seltzer and appeared viable and hemostatic. I also performed a manual vaginal exam and confirmed that there was no entrapped vaginal wall within the staple line.  The abdomen was then irrigated with 2 L of warm sterile saline and hemostasis ensured. The retroperitoneum was completely exposed and the ureter was confirmed free of injury, though of note it the entire mid-portion was freely mobile in the field. A 19 French round Blake drain was inserted through the 12 mm trocar site in the right lower quadrant and directed to sit in the pelvis and the left retroperitoneum. This was secured to the skin using a 2-0 nylon.  At this point, drapes, gowns, gloves, and instruments were exchanged in accordance with the ERAS protocol.  Seprafilm was then placed in the abdomen. Exparel was injected along the incision. Fascia was then closed with running single strand #1 PDS starting at either end and tying centrally. The soft tissues were irrigated. The midline incision and all port sites were  then closed with staples. A provena wound VAC was then applied. The drain was placed to bulb suction.  I removed the ureteral stents which were inspected and confirmed to be intact.  The patient was then awakened, extubated and taken to PACU in stable condition.   All counts were correct at the completion of the case.

## 2019-01-06 NOTE — Anesthesia Postprocedure Evaluation (Signed)
Anesthesia Post Note  Patient: Danielle Villa  Procedure(s) Performed: LAPAROSCOPIC EXTENDED LEFT HEMI-  COLECTOMY (N/A Abdomen) LEFT OOPHORECTOMY (Abdomen) PROCTOSCOPY APPLICATION OF WOUND VAC (Abdomen) CYSTOSCOPY WITH BILATERAL STENT PLACEMENT (Bilateral Urethra)     Patient location during evaluation: PACU Anesthesia Type: General Level of consciousness: awake and alert Pain management: pain level controlled Vital Signs Assessment: post-procedure vital signs reviewed and stable Respiratory status: spontaneous breathing, nonlabored ventilation, respiratory function stable and patient connected to nasal cannula oxygen Cardiovascular status: blood pressure returned to baseline and stable Postop Assessment: no apparent nausea or vomiting Anesthetic complications: no    Last Vitals:  Vitals:   01/06/19 1815 01/06/19 1853  BP: (!) 103/56 120/70  Pulse: 63 67  Resp: 13   Temp: 36.5 C 36.4 C  SpO2: 100% 100%    Last Pain:  Vitals:   01/06/19 1853  TempSrc: Oral  PainSc:                  Stan Cantave DAVID

## 2019-01-06 NOTE — Anesthesia Procedure Notes (Signed)
Procedure Name: Intubation Date/Time: 01/06/2019 12:56 PM Performed by: Sharlette Dense, CRNA Patient Re-evaluated:Patient Re-evaluated prior to induction Oxygen Delivery Method: Circle system utilized Preoxygenation: Pre-oxygenation with 100% oxygen Induction Type: IV induction Ventilation: Mask ventilation without difficulty and Oral airway inserted - appropriate to patient size Laryngoscope Size: Miller Grade View: Grade I Tube type: Oral Tube size: 7.0 mm Number of attempts: 1 Airway Equipment and Method: Stylet Placement Confirmation: ETT inserted through vocal cords under direct vision,  positive ETCO2 and breath sounds checked- equal and bilateral Secured at: 20 cm Tube secured with: Tape Dental Injury: Teeth and Oropharynx as per pre-operative assessment

## 2019-01-06 NOTE — Discharge Instructions (Addendum)
If you develop recurrent, continued, or worsening chest pain, shortness of breath, fever, vomiting, abdominal or back pain, or any other new/concerning symptoms then return to the ER for evaluation.   Arrive for your surgery at the predetermined time on 01/06/2019

## 2019-01-06 NOTE — Transfer of Care (Signed)
Immediate Anesthesia Transfer of Care Note  Patient: Danielle Villa  Procedure(s) Performed: LAPAROSCOPIC EXTENDED LEFT HEMI-  COLECTOMY (N/A Abdomen) LEFT OOPHORECTOMY (Abdomen) PROCTOSCOPY APPLICATION OF WOUND VAC (Abdomen) CYSTOSCOPY WITH BILATERAL STENT PLACEMENT (Bilateral Urethra)  Patient Location: PACU  Anesthesia Type:General  Level of Consciousness: awake, alert , oriented and patient cooperative  Airway & Oxygen Therapy: Patient Spontanous Breathing and Patient connected to face mask oxygen  Post-op Assessment: Report given to RN and Post -op Vital signs reviewed and stable  Post vital signs: Reviewed and stable  Last Vitals:  Vitals Value Taken Time  BP 138/74 01/06/2019  5:48 PM  Temp    Pulse 77 01/06/2019  5:50 PM  Resp 11 01/06/2019  5:50 PM  SpO2 100 % 01/06/2019  5:50 PM  Vitals shown include unvalidated device data.  Last Pain:  Vitals:   01/06/19 1059  PainSc: 0-No pain         Complications: No apparent anesthetic complications

## 2019-01-07 LAB — CBC
HCT: 29.4 % — ABNORMAL LOW (ref 36.0–46.0)
Hemoglobin: 8.9 g/dL — ABNORMAL LOW (ref 12.0–15.0)
MCH: 27.2 pg (ref 26.0–34.0)
MCHC: 30.3 g/dL (ref 30.0–36.0)
MCV: 89.9 fL (ref 80.0–100.0)
Platelets: 291 10*3/uL (ref 150–400)
RBC: 3.27 MIL/uL — ABNORMAL LOW (ref 3.87–5.11)
RDW: 15.8 % — ABNORMAL HIGH (ref 11.5–15.5)
WBC: 11.4 10*3/uL — AB (ref 4.0–10.5)
nRBC: 0 % (ref 0.0–0.2)

## 2019-01-07 LAB — BASIC METABOLIC PANEL
Anion gap: 7 (ref 5–15)
BUN: 11 mg/dL (ref 6–20)
CO2: 22 mmol/L (ref 22–32)
Calcium: 7.8 mg/dL — ABNORMAL LOW (ref 8.9–10.3)
Chloride: 108 mmol/L (ref 98–111)
Creatinine, Ser: 0.75 mg/dL (ref 0.44–1.00)
GFR calc Af Amer: >60 mL/min (ref >60)
GFR calc non Af Amer: >60 mL/min (ref >60)
Glucose, Bld: 163 mg/dL — ABNORMAL HIGH (ref 70–99)
Potassium: 3.8 mmol/L (ref 3.5–5.1)
SODIUM: 137 mmol/L (ref 135–145)

## 2019-01-07 LAB — MAGNESIUM: Magnesium: 1.7 mg/dL (ref 1.7–2.4)

## 2019-01-07 MED ORDER — FERROUS SULFATE 325 (65 FE) MG PO TABS
325.0000 mg | ORAL_TABLET | Freq: Three times a day (TID) | ORAL | Status: DC
  Administered 2019-01-07 – 2019-01-11 (×14): 325 mg via ORAL
  Filled 2019-01-07 (×12): qty 1

## 2019-01-07 MED ORDER — GABAPENTIN 300 MG PO CAPS
300.0000 mg | ORAL_CAPSULE | Freq: Three times a day (TID) | ORAL | Status: DC
  Administered 2019-01-07 – 2019-01-08 (×6): 300 mg via ORAL
  Filled 2019-01-07 (×6): qty 1

## 2019-01-07 MED ORDER — POTASSIUM CHLORIDE 10 MEQ/100ML IV SOLN
10.0000 meq | INTRAVENOUS | Status: AC
  Administered 2019-01-07 (×2): 10 meq via INTRAVENOUS
  Filled 2019-01-07 (×2): qty 100

## 2019-01-07 MED ORDER — MAGNESIUM SULFATE 50 % IJ SOLN
3.0000 g | Freq: Once | INTRAVENOUS | Status: AC
  Administered 2019-01-07: 3 g via INTRAVENOUS
  Filled 2019-01-07: qty 6

## 2019-01-07 MED ORDER — KETOROLAC TROMETHAMINE 30 MG/ML IJ SOLN: 15.0000 mg | Freq: Four times a day (QID) | INTRAMUSCULAR | Status: DC | PRN

## 2019-01-07 MED ORDER — DOCUSATE SODIUM 100 MG PO CAPS
100.0000 mg | ORAL_CAPSULE | Freq: Two times a day (BID) | ORAL | Status: DC
  Administered 2019-01-07 – 2019-01-10 (×7): 100 mg via ORAL
  Filled 2019-01-07 (×7): qty 1

## 2019-01-07 NOTE — Evaluation (Signed)
Occupational Therapy Evaluation Patient Details Name: Danielle Villa MRN: [id number] DOB: Oct 15, 1961 Today's Date: 01/07/2019    History of Present Illness Pt is a 58yo female who undersent a laporoscopic converted to an open extended L hemicolectomy secondary to diverticulitis.     Clinical Impression   Pt admitted with the above diagnosis and has the deficits listed below. Pt would benefit from cont OT to increase independence with basic adls specifically related to LEs so she can eventually d/c home alone and complete all basic adls without assist. Pt has children in the area that can help short term.  Recommended that someone stay with her for first few days then pt should be fine home alone.  Pt may or may not need HHOT. Will continue to monitor.    Follow Up Recommendations  Home health OT;Supervision - Intermittent    Equipment Recommendations  3 in 1 bedside commode    Recommendations for Other Services       Precautions / Restrictions Precautions Precautions: Fall Precaution Comments: Pt with wound vac, foley and drain Restrictions Weight Bearing Restrictions: No      Mobility Bed Mobility Overal bed mobility: Needs Assistance Bed Mobility: Supine to Sit     Supine to sit: Min assist     General bed mobility comments: assist to come to full sitting due to pain.  Transfers Overall transfer level: Needs assistance Equipment used: Rolling walker (2 wheeled) Transfers: Sit to/from [facility] Sit to Stand: Min guard Stand pivot transfers: Min guard       General transfer comment: walker used since first time up. Pt very sore.    Balance Overall balance assessment: No apparent balance deficits (not formally assessed)                                         ADL either performed or assessed with clinical judgement   ADL Overall ADL's : Needs assistance/impaired Eating/Feeding: Set up;Sitting Eating/Feeding Details  (indicate cue type and reason): liquids only Grooming: Wash/dry hands;Wash/dry face;Oral care;Set up;Standing Grooming Details (indicate cue type and reason): stood at sink for appx 4 min with supervision Upper Body Bathing: Set up;Sitting   Lower Body Bathing: Minimal assistance;Sit to/from stand   Upper Body Dressing : Set up;Sitting   Lower Body Dressing: Moderate assistance;Sit to/from stand Lower Body Dressing Details (indicate cue type and reason): difficulty accessing LEs at this time due to pain in abdoment. Toilet Transfer: Minimal assistance;Ambulation;RW Toilet Transfer Details (indicate cue type and reason): Pt walked to bathroom with walker and IV pole. Toileting- Water quality scientist and Hygiene: Min guard;Sit to/from stand       Functional mobility during ADLs: Min guard;Rolling walker General ADL Comments: Pt did well with UE adls. Limited with LE adls due to pain in abdomen.     Vision Baseline Vision/History: No visual deficits Patient Visual Report: No change from baseline Vision Assessment?: No apparent visual deficits     Perception Perception Perception Tested?: No   Praxis Praxis Praxis tested?: Within functional limits    Pertinent Vitals/Pain Pain Assessment: 0-10 Pain Score: 10-Worst pain ever Pain Location: abdomen Pain Descriptors / Indicators: Discomfort;Operative site guarding;Sore Pain Intervention(s): Limited activity within patient's tolerance;Monitored during session;Premedicated before session;Repositioned;Patient requesting pain meds-RN notified;Relaxation     Hand Dominance Right   Extremity/Trunk Assessment Upper Extremity Assessment Upper Extremity Assessment: Overall WFL for tasks assessed  Lower Extremity Assessment Lower Extremity Assessment: Defer to PT evaluation   Cervical / Trunk Assessment Cervical / Trunk Assessment: Normal   Communication Communication Communication: No difficulties   Cognition  Arousal/Alertness: Awake/alert Behavior During Therapy: WFL for tasks assessed/performed Overall Cognitive Status: Within Functional Limits for tasks assessed                                     General Comments  Overall, pt moving well for first therapy session.  Should be able to d/c home with kids assisting just first day or two for safety.    Exercises     Shoulder Instructions      Home Living Family/patient expects to be discharged to:: Private residence Living Arrangements: Alone Available Help at Discharge: Family;Available 24 hours/day Type of Home: Apartment Home Access: Level entry     Home Layout: One level     Bathroom Shower/Tub: Tub/shower unit;Curtain   Bathroom Toilet: Standard     Home Equipment: None   Additional Comments: Pt on disability. Does not drive      Prior Functioning/Environment Level of Independence: Independent                 OT Problem List: Decreased activity tolerance;Decreased knowledge of use of DME or AE;Pain      OT Treatment/Interventions: Self-care/ADL training;Therapeutic activities;DME and/or AE instruction    OT Goals(Current goals can be found in the care plan section) Acute Rehab OT Goals Patient Stated Goal: to get rid of the pain OT Goal Formulation: With patient Time For Goal Achievement: 01/14/19 Potential to Achieve Goals: Good ADL Goals Pt Will Perform Lower Body Bathing: with modified independence;sit to/from stand Pt Will Perform Lower Body Dressing: with modified independence;sit to/from stand Additional ADL Goal #1: Pt will walk to bathroom and complete all toileting tasks with mod I.  OT Frequency: Min 2X/week   Barriers to D/C: Decreased caregiver support  lives alone.  Children can help for a few days.       Co-evaluation              AM-PAC OT "6 Clicks" Daily Activity     Outcome Measure Help from another person eating meals?: None Help from another person taking  care of personal grooming?: None Help from another person toileting, which includes using toliet, bedpan, or urinal?: A Little Help from another person bathing (including washing, rinsing, drying)?: A Little Help from another person to put on and taking off regular upper body clothing?: None Help from another person to put on and taking off regular lower body clothing?: A Little 6 Click Score: 21   End of Session Equipment Utilized During Treatment: Rolling walker Nurse Communication: Mobility status  Activity Tolerance: Patient limited by pain Patient left: in chair;with call bell/phone within reach;with family/visitor present  OT Visit Diagnosis: Unsteadiness on feet (R26.81)                Time: 1224-8250 OT Time Calculation (min): 27 min Charges:  OT General Charges $OT Visit: 1 Visit OT Evaluation $OT Eval Moderate Complexity: 1 Mod OT Treatments $Self Care/Home Management : 8-22 mins  Jinger Neighbors, OTR/L [phone]  Glenford Peers 01/07/2019, 9:23 AM

## 2019-01-07 NOTE — Progress Notes (Signed)
S: Pain is controlled as long as she is asleep. Did not walk last night due to pain. Tolerating clears and had one bloody bowel movement overnight. Feeling hungry and asking when she can have more to eat. Did have one episode of nausea requiring zofran. Has received tramadol, IV robaxin, toradol, dilaudid each x 1 along with scheduled tylenol and gabapentin.   Vitals, labs, intake/output, and orders reviewed at this time. Tmax 37.2. HR largely in 60-70 range but 97 on last check. Normotensive most of the night, had some hypotension (lowest 87/45) last night around 10pm which resolved spontaneously after about 57min it seems, but last check 148/95. Sats 96-100 room air.  PO 600 UOP 620 (425 o/n) JP 325   Gen: A&Ox3, no distress H&N: EOMI, atraumatic, neck supple Chest: unlabored respirations, RRR with strong distal pulses Abd: soft, appropriately tender, nondistended, incision w provena in place holding suction, no fluid in cannister. JP output serosanguinous Ext: warm, no edema Neuro: grossly normal GU- foley in place, tea-colored urine  Lines/tubes/drains: PIV, JP, foley, provena  A/P:  POD 1 s/p lap converted to open extended left hemicolectomy with low colorectal anastomosis for diverticulitis.  Advance to full liquid diet, continue boost/ breeze  Needs to walk today; PT/OT ordered  Encourage pulmonary toilet/ IS  Post-op pain- continue multimodal pain regimen and minimize narcotics. Will increase gabapentin to TID  Hematuria- likely residual from stents. Continue foley and low rate IVF today, decrease toradol dose. Creatinine is OK this am.  Acute blood loss anemia- hgb 8.9 this am, baseline is between 11-12.  Patient is Jehovah's witness. Clinically no apparent ongoing bleeding. Will start iron supplement, add colace, hold lovenox today  Hypomagnesemia, borderline hypokalemia- replace IV  Recheck all labs tomorrow  Romana Juniper, MD [facility], [place] Pager  [phone]

## 2019-01-07 NOTE — Progress Notes (Signed)
ERAS education reinforced. Did patient attend class prior to procedure? Yes [ x  ] No [   ] Discussed: Pain Control [ x  ] Mobility [ x  ] Diet [   ] Other [  x ]  Patient reports has been in chair for several hours today. Planning to walk unit later this shift. Has not been using Chiropodist- gave to patient and she was only able to pull 250 at first. Up to 500 by time I left room with the goal of 1000 by the end of the day. Will follow. Pecolia Ades, RN, BSN Quality Program Coordinator, Enhanced Recovery after Surgery 01/07/19 3:00 PM

## 2019-01-07 NOTE — Evaluation (Signed)
Physical Therapy Evaluation Patient Details Name: Danielle Villa MRN: [id number] DOB: January 31, 1961 Today's Date: 01/07/2019   History of Present Illness  Pt is a 58yo female who undersent a laporoscopic converted to an open extended L hemicolectomy secondary to diverticulitis.    Clinical Impression  Pt admitted with above diagnosis. Pt currently with functional limitations due to the deficits listed below (see PT Problem List).  Pt will benefit from skilled PT to increase their independence and safety with mobility to allow discharge to the venue listed below.  Pt reliant on RW due to pain and recommend one at time of d/c.  Also agree with OT to have family stay with her a few days after d/c.  At this time, feel that she will not need PT after d/c, but will continue to monitor.     Follow Up Recommendations No PT follow up    Equipment Recommendations  Rolling walker with 5" wheels    Recommendations for Other Services       Precautions / Restrictions Precautions Precautions: Fall Precaution Comments: Pt with wound vac, foley and drain Restrictions Weight Bearing Restrictions: No      Mobility  Bed Mobility Overal bed mobility: Needs Assistance Bed Mobility: Supine to Sit     Supine to sit: Min assist     General bed mobility comments: up in recliner and returned to recliner  Transfers Overall transfer level: Needs assistance Equipment used: Rolling walker (2 wheeled) Transfers: Sit to/from Stand Sit to Stand: Min guard Stand pivot transfers: Min guard       General transfer comment: cues for hand placement. A little lightheaded upon standing, but it went away.  Ambulation/Gait Ambulation/Gait assistance: Min guard Gait Distance (Feet): 90 Feet Assistive device: Rolling walker (2 wheeled) Gait Pattern/deviations: Decreased step length - right;Decreased step length - left;Trunk flexed Gait velocity: decreased   General Gait Details: Pt ambulated with slow  guarded gait.  Stairs            Wheelchair Mobility    Modified Rankin (Stroke Patients Only)       Balance Overall balance assessment: No apparent balance deficits (not formally assessed)                                           Pertinent Vitals/Pain Pain Assessment: 0-10 Pain Score: 10-Worst pain ever Pain Location: abdomen Pain Descriptors / Indicators: Discomfort;Operative site guarding;Sore Pain Intervention(s): Limited activity within patient's tolerance;Monitored during session;Premedicated before session;Repositioned    Home Living Family/patient expects to be discharged to:: Private residence Living Arrangements: Alone Available Help at Discharge: Family;Available 24 hours/day Type of Home: Apartment Home Access: Level entry     Home Layout: One level Home Equipment: None Additional Comments: Pt on disability. Does not drive    Prior Function Level of Independence: Independent               Hand Dominance   Dominant Hand: Right    Extremity/Trunk Assessment   Upper Extremity Assessment Upper Extremity Assessment: Defer to OT evaluation    Lower Extremity Assessment Lower Extremity Assessment: Overall WFL for tasks assessed;Generalized weakness    Cervical / Trunk Assessment Cervical / Trunk Assessment: Normal  Communication   Communication: No difficulties  Cognition Arousal/Alertness: Awake/alert Behavior During Therapy: WFL for tasks assessed/performed Overall Cognitive Status: Within Functional Limits for tasks assessed  General Comments General comments (skin integrity, edema, etc.): Overall, pt moving well for first therapy session.  Should be able to d/c home with kids assisting just first day or two for safety.    Exercises     Assessment/Plan    PT Assessment Patient needs continued PT services  PT Problem List Decreased strength;Decreased  mobility;Decreased knowledge of use of DME       PT Treatment Interventions DME instruction;Gait training;Functional mobility training;Therapeutic activities;Therapeutic exercise;Balance training    PT Goals (Current goals can be found in the Care Plan section)  Acute Rehab PT Goals Patient Stated Goal: to get rid of the pain PT Goal Formulation: With patient Time For Goal Achievement: 01/21/19 Potential to Achieve Goals: Good    Frequency Min 3X/week   Barriers to discharge        Co-evaluation               AM-PAC PT "6 Clicks" Mobility  Outcome Measure Help needed turning from your back to your side while in a flat bed without using bedrails?: A Lot Help needed moving from lying on your back to sitting on the side of a flat bed without using bedrails?: A Lot Help needed moving to and from a bed to a chair (including a wheelchair)?: A Little Help needed standing up from a chair using your arms (e.g., wheelchair or bedside chair)?: A Little Help needed to walk in hospital room?: A Little Help needed climbing 3-5 steps with a railing? : A Little 6 Click Score: 16    End of Session   Activity Tolerance: Patient tolerated treatment well Patient left: in chair;with call bell/phone within reach Nurse Communication: Mobility status PT Visit Diagnosis: Muscle weakness (generalized) (M62.81);Difficulty in walking, not elsewhere classified (R26.2)    Time: 1050-1107 PT Time Calculation (min) (ACUTE ONLY): 17 min   Charges:   PT Evaluation $PT Eval Moderate Complexity: 1 Mod          Eisa Conaway L. Tamala Julian, [place] Pager [phone] 01/07/2019   Galen Manila 01/07/2019, 11:29 AM

## 2019-01-08 ENCOUNTER — Encounter (HOSPITAL_COMMUNITY): Payer: Self-pay | Admitting: Surgery

## 2019-01-08 LAB — BASIC METABOLIC PANEL
Anion gap: 3 — ABNORMAL LOW (ref 5–15)
BUN: 9 mg/dL (ref 6–20)
CO2: 25 mmol/L (ref 22–32)
Calcium: 7.7 mg/dL — ABNORMAL LOW (ref 8.9–10.3)
Chloride: 114 mmol/L — ABNORMAL HIGH (ref 98–111)
Creatinine, Ser: 0.58 mg/dL (ref 0.44–1.00)
GFR calc Af Amer: >60 mL/min (ref >60)
GFR calc non Af Amer: >60 mL/min (ref >60)
Glucose, Bld: 90 mg/dL (ref 70–99)
Potassium: 3.7 mmol/L (ref 3.5–5.1)
Sodium: 142 mmol/L (ref 135–145)

## 2019-01-08 LAB — CBC
HCT: 25.7 % — ABNORMAL LOW (ref 36.0–46.0)
Hemoglobin: 7.6 g/dL — ABNORMAL LOW (ref 12.0–15.0)
MCH: 27 pg (ref 26.0–34.0)
MCHC: 29.6 g/dL — ABNORMAL LOW (ref 30.0–36.0)
MCV: 91.1 fL (ref 80.0–100.0)
NRBC: 0 % (ref 0.0–0.2)
Platelets: 242 10*3/uL (ref 150–400)
RBC: 2.82 MIL/uL — ABNORMAL LOW (ref 3.87–5.11)
RDW: 16.1 % — ABNORMAL HIGH (ref 11.5–15.5)
WBC: 7.8 10*3/uL (ref 4.0–10.5)

## 2019-01-08 LAB — MAGNESIUM: Magnesium: 2.3 mg/dL (ref 1.7–2.4)

## 2019-01-08 MED ORDER — POTASSIUM CHLORIDE 20 MEQ PO PACK
40.0000 meq | PACK | Freq: Once | ORAL | Status: AC
  Administered 2019-01-08: 40 meq via ORAL
  Filled 2019-01-08: qty 2

## 2019-01-08 NOTE — Progress Notes (Signed)
S: Slept ok overnight, but hasn't had any meds since 10pm and is in pain now. Tolerating full liquids without nausea or bloating. Hungry. Had a bowel movement in her sleep last night which was reportedly still blood tinged. Denies flatus. Walked in the halls twice yesterday and sat in chair for several hours. Working on IS.  Meds- scheduled tylenol/ gabapentin. Received oxycodone x 2 and tramadol x 2 last 24h, no IV PRNs for pain or nausea given.   Vitals, labs, intake/output, and orders reviewed at this time. Tmax 99, HR 68-86, Normotensive. Sats 97% room air.  PO 960, UOP 3125, JP 180 WBC 7.8 (11.4), Hgb 7.6 (8.9), Plt 242 (291) Cr 0.58   Gen: A&Ox3, no distress H&N: EOMI, atraumatic, neck supple Chest: unlabored respirations, RRR with strong distal pulses Abd: soft, appropriately tender, nondistended, incision w provena in place but suction is not working, no fluid in cannister. JP output serosanguinous- lighter than yesterday Ext: warm, no edema Neuro: grossly normal GU- foley in place, urine is slightly blood tinged but much better than yesterday.  Lines/tubes/drains: PIV, JP, foley, provena  A/P:  POD 2 s/p lap converted to open extended left hemicolectomy with low colorectal anastomosis for diverticulitis. Path reviewed- no malignancy, inflamed segment strictured down to 0.1cm lumen  Advance diet to soft, continue boost/ breeze  Put provena to hospital wound vac as portable cannister is not working.   Continue ambulating as much as tolerated; PT/OT following  Encourage pulmonary toilet/ IS  Post-op pain- continue multimodal pain regimen and minimize narcotics.   Hematuria- improving, likely residual from stents.  Creatinine is OK this am. Stop IVF. Remove foley.  Acute blood loss anemia- hgb 7.6 this am, baseline is between 11-12.  All cell lines down today so may be partially dilutional at this point. Patient is Jehovah's witness. Clinically no apparent ongoing bleeding.  Continue iron supplement, add colace, will continue to hold lovenox today, patient has SCDs on and will continue walking, discussed risks of DVT/ PE vs risks of bleeding with inability to transfuse  Hypomagnesemia, borderline hypokalemia- mag resolved, K 3.7 today will replace PO  Leukocytosis- resolved/ expected post-op. Received cefotetan x 2 periop doses  Recheck labs tomorrow  Romana Juniper, MD [facility], [place] Pager [phone]

## 2019-01-08 NOTE — Progress Notes (Signed)
Occupational Therapy Treatment Patient Details Name: Danielle Villa MRN: [id number] DOB: 09/24/1961 Today's Date: 01/08/2019    History of present illness Pt is a 58yo female who undersent a laporoscopic converted to an open extended L hemicolectomy secondary to diverticulitis.     OT comments  Educated on, used and issued AE for adls, used bathroom and walked in hall at pt's request.   Follow Up Recommendations  No OT follow up    Equipment Recommendations  3 in 1 bedside commode    Recommendations for Other Services      Precautions / Restrictions Precautions Precautions: Fall Precaution Comments: Pt with wound vac, foley and drain Restrictions Weight Bearing Restrictions: No       Mobility Bed Mobility            oob      Transfers       Sit to Stand: Supervision;Min guard         General transfer comment: assist for lines. Used RW in hallway and pole out of bathroom    Balance                                           ADL either performed or assessed with clinical judgement   ADL       Grooming: Wash/dry hands;Supervision/safety;Standing       Lower Body Bathing: Supervison/ safety;Sit to/from stand;With adaptive equipment       Lower Body Dressing: Minimal assistance;Sit to/from stand;With adaptive equipment   Toilet Transfer: Min guard;Ambulation;Comfort height toilet;RW   Toileting- Clothing Manipulation and Hygiene: Supervision/safety;Sit to/from stand         General ADL Comments: educated pt on AE and issued to her.  Used pillowcase as pants and socks. Will reinforce once more prior to d/c.  Used toilet and cleaned peri area herself with set up.  Pt requested to walk in hall--supervision/min guard to manage vac line.  Pain increased and pt requested pain meds     Vision       Perception     Praxis      Cognition Arousal/Alertness: Awake/alert Behavior During Therapy: WFL for tasks  assessed/performed Overall Cognitive Status: Within Functional Limits for tasks assessed                                          Exercises     Shoulder Instructions       General Comments      Pertinent Vitals/ Pain       Pain Score: 8  Pain Location: abdomen Pain Descriptors / Indicators: Discomfort;Operative site guarding;Sore Pain Intervention(s): Limited activity within patient's tolerance;Monitored during session;Premedicated before session;Repositioned;Patient requesting pain meds-RN notified;RN gave pain meds during session  Home Living                                          Prior Functioning/Environment              Frequency  Min 2X/week        Progress Toward Goals  OT Goals(current goals can now be found in the care plan section)  Progress towards OT goals: Progressing toward goals  Plan      Co-evaluation                 AM-PAC OT "6 Clicks" Daily Activity     Outcome Measure   Help from another person eating meals?: None Help from another person taking care of personal grooming?: A Little Help from another person toileting, which includes using toliet, bedpan, or urinal?: A Little Help from another person bathing (including washing, rinsing, drying)?: A Little Help from another person to put on and taking off regular upper body clothing?: A Little Help from another person to put on and taking off regular lower body clothing?: A Little 6 Click Score: 19    End of Session    OT Visit Diagnosis: Unsteadiness on feet (R26.81)   Activity Tolerance Patient tolerated treatment well   Patient Left in chair;with call bell/phone within reach;with nursing/sitter in room   Nurse Communication          Time: 9179-1505 OT Time Calculation (min): 36 min  Charges: OT General Charges $OT Visit: 1 Visit OT Treatments $Self Care/Home Management : 23-37 mins  Danielle Villa, OTR/L Acute  Rehabilitation Services [phone] [facility] pager [phone] office 01/08/2019   [place] 01/08/2019, 3:43 PM

## 2019-01-09 LAB — BASIC METABOLIC PANEL
Anion gap: 5 (ref 5–15)
BUN: 8 mg/dL (ref 6–20)
CO2: 25 mmol/L (ref 22–32)
Calcium: 8 mg/dL — ABNORMAL LOW (ref 8.9–10.3)
Chloride: 111 mmol/L (ref 98–111)
Creatinine, Ser: 0.5 mg/dL (ref 0.44–1.00)
GFR calc Af Amer: >60 mL/min (ref >60)
GFR calc non Af Amer: >60 mL/min (ref >60)
Glucose, Bld: 90 mg/dL (ref 70–99)
Potassium: 3.7 mmol/L (ref 3.5–5.1)
Sodium: 141 mmol/L (ref 135–145)

## 2019-01-09 LAB — CBC
HCT: 23.5 % — ABNORMAL LOW (ref 36.0–46.0)
Hemoglobin: 7 g/dL — ABNORMAL LOW (ref 12.0–15.0)
MCH: 27.3 pg (ref 26.0–34.0)
MCHC: 29.8 g/dL — ABNORMAL LOW (ref 30.0–36.0)
MCV: 91.8 fL (ref 80.0–100.0)
PLATELETS: 254 10*3/uL (ref 150–400)
RBC: 2.56 MIL/uL — ABNORMAL LOW (ref 3.87–5.11)
RDW: 16.2 % — ABNORMAL HIGH (ref 11.5–15.5)
WBC: 8.2 10*3/uL (ref 4.0–10.5)
nRBC: 0 % (ref 0.0–0.2)

## 2019-01-09 MED ORDER — KETOROLAC TROMETHAMINE 30 MG/ML IJ SOLN: 15.0000 mg | Freq: Four times a day (QID) | INTRAMUSCULAR | Status: AC | PRN

## 2019-01-09 MED ORDER — HEPARIN SODIUM (PORCINE) 5000 UNIT/ML IJ SOLN
5000.0000 [IU] | Freq: Three times a day (TID) | INTRAMUSCULAR | Status: DC
  Administered 2019-01-09 – 2019-01-11 (×7): 5000 [IU] via SUBCUTANEOUS
  Filled 2019-01-09 (×8): qty 1

## 2019-01-09 MED ORDER — GABAPENTIN 300 MG PO CAPS
300.0000 mg | ORAL_CAPSULE | Freq: Two times a day (BID) | ORAL | Status: DC
  Administered 2019-01-09 – 2019-01-11 (×5): 300 mg via ORAL
  Filled 2019-01-09 (×5): qty 1

## 2019-01-09 MED ORDER — OXYCODONE HCL 5 MG PO TABS
5.0000 mg | ORAL_TABLET | Freq: Four times a day (QID) | ORAL | Status: DC | PRN
  Administered 2019-01-09 – 2019-01-11 (×7): 5 mg via ORAL
  Filled 2019-01-09 (×7): qty 1

## 2019-01-09 MED ORDER — HYDROMORPHONE HCL 1 MG/ML IJ SOLN: 0.5000 mg | INTRAMUSCULAR | Status: DC | PRN

## 2019-01-09 MED ORDER — ENSURE SURGERY PO LIQD
237.0000 mL | Freq: Three times a day (TID) | ORAL | Status: DC
  Administered 2019-01-09 – 2019-01-11 (×8): 237 mL via ORAL
  Filled 2019-01-09 (×9): qty 237

## 2019-01-09 MED ORDER — ADULT MULTIVITAMIN W/MINERALS CH
1.0000 | ORAL_TABLET | Freq: Every day | ORAL | Status: DC
  Administered 2019-01-09 – 2019-01-11 (×3): 1 via ORAL
  Filled 2019-01-09 (×3): qty 1

## 2019-01-09 MED ORDER — POTASSIUM CHLORIDE 20 MEQ PO PACK
40.0000 meq | PACK | Freq: Once | ORAL | Status: AC
  Administered 2019-01-09: 40 meq via ORAL
  Filled 2019-01-09: qty 2

## 2019-01-09 MED ORDER — ACETAMINOPHEN 500 MG PO TABS
1000.0000 mg | ORAL_TABLET | Freq: Three times a day (TID) | ORAL | Status: DC
  Administered 2019-01-09 – 2019-01-11 (×7): 1000 mg via ORAL
  Filled 2019-01-09 (×7): qty 2

## 2019-01-09 NOTE — Progress Notes (Signed)
S: Did ok yesterday. Still with pain. Walked and sat in chair. Cannot get higher than 500 on IS. Tolerating diet without nausea or emesis, does not feel bloated. Had a liquid bowel movement yesterday.   Meds- scheduled tylenol/ gabapentin. Received oxycodone x 3 and tramadol x 1 last 24h, received dilaudid this morning and yesterday morning. Has not gotten any toradol or robaxin. No PRNs given for nausea.  Vitals, labs, intake/output, and orders reviewed at this time. Tmax 100.2 this morning HR 80s, one reading of 115 last night, Normotensive, with one random bp 79/45 yesterday afernoon. Sats 100% room air.  PO 420, UOP 700 + 1 void, JP 180, BM x 1. She ate her entire dinner last night. WBC 8.2 (7.8), Hgb 7.0 (7.6), Plt 254 (242) Cr 0.58   Gen: A&Ox3, no distress, sitting up on side of bed and on the phone H&N: EOMI, atraumatic, neck supple Chest: unlabored respirations, RRR with strong distal pulses Abd: very soft, appropriately tender, mildly distended, incision w provena in place attached to hospital wound vac, but the vac is turned off and there is no suction being applied again this morning. JP output serosanguinous. Provena removed- all incisions are clea, dry and intact without erythema, induration or drainage Ext: warm, no edema, no calf tenderness Neuro: grossly normal   Lines/tubes/drains: PIV, JP  A/P:  POD 3 s/p lap converted to open extended left hemicolectomy with low colorectal anastomosis for diverticulitis. Path reviewed- no malignancy, inflamed segment strictured down to 0.1cm lumen  Advance diet to regular, continue boost/ breeze  Provena removed as we cannot seem to get a functional vac. Incisions can stay open to air at this point.  Continue ambulating as much as tolerated; PT/OT following  Encourage pulmonary toilet/ IS- suspect her low grade temp this am is secondary to atelectasis given her performance with IS  Post-op pain- continue multimodal pain regimen and  minimize narcotics. Orders changed to try toradol before dilaudid for pain not relieved by PO meds. Will wean tylenol and gabapentin  Hematuria- resolved, likely from stents.  Creatinine is OK this am. Continue strict I&O  Acute blood loss anemia- hgb 7.0 this am (stable), baseline is between 11-12.  Clinically no apparent ongoing bleeding. Continue iron supplement, add MVI, colace, will start SQ heparin today, patient has SCDs on and will continue walking, discussed risks of DVT/ PE vs risks of bleeding with inability to transfuse  Hypomagnesemia, borderline hypokalemia- mag resolved, K 3.7 today again will replace PO  Leukocytosis- resolved/ expected post-op. Received cefotetan x 2 periop doses    Romana Juniper, MD [facility], [place] Pager [phone]

## 2019-01-09 NOTE — Progress Notes (Signed)
Physical Therapy Treatment Patient Details Name: Danielle Villa MRN: [id number] DOB: 1960-12-13 Today's Date: 01/09/2019    History of Present Illness Pt is a 58yo female who undersent a laporoscopic converted to an open extended L hemicolectomy secondary to diverticulitis.      PT Comments    Patient seen for mobility progression. Patient requiring min guard for all bed mobility, transfers, and gait with RW for general safety. Patient very guarded throughout session. Reports 8/10 pain at all time, however very tolerable to all activities without issue. Patient educated on need for continued OOB mobility to continue to progress strength, endurance, and overall independence with mobility. Will continue to follow.    Follow Up Recommendations  No PT follow up     Equipment Recommendations  Rolling walker with 5" wheels    Recommendations for Other Services       Precautions / Restrictions Precautions Precautions: Fall Precaution Comments: patient with drain at R abdomen Restrictions Weight Bearing Restrictions: No    Mobility  Bed Mobility Overal bed mobility: Needs Assistance Bed Mobility: Supine to Sit     Supine to sit: Min guard     General bed mobility comments: min guard for safety  Transfers Overall transfer level: Needs assistance Equipment used: Rolling walker (2 wheeled) Transfers: Sit to/from Stand Sit to Stand: Min guard         General transfer comment: min guard for safety and immediate standing balance  Ambulation/Gait Ambulation/Gait assistance: Min guard Gait Distance (Feet): (1 lap around unit) Assistive device: Rolling walker (2 wheeled) Gait Pattern/deviations: Step-through pattern;Decreased stride length;Trunk flexed Gait velocity: decreased   General Gait Details: slow steady pace of gait. took ~4-5 min to take 1 lap around unit; guarded posture   Stairs             Wheelchair Mobility    Modified Rankin (Stroke  Patients Only)       Balance Overall balance assessment: Mild deficits observed, not formally tested                                          Cognition Arousal/Alertness: Awake/alert Behavior During Therapy: WFL for tasks assessed/performed Overall Cognitive Status: Within Functional Limits for tasks assessed                                        Exercises      General Comments        Pertinent Vitals/Pain Pain Assessment: 0-10 Pain Score: 8  Pain Location: abdomen Pain Descriptors / Indicators: Aching;Discomfort;Guarding Pain Intervention(s): Limited activity within patient's tolerance;Monitored during session;Repositioned;Premedicated before session    Home Living                      Prior Function            PT Goals (current goals can now be found in the care plan section) Acute Rehab PT Goals Patient Stated Goal: to get rid of the pain PT Goal Formulation: With patient Time For Goal Achievement: 01/21/19 Potential to Achieve Goals: Good Progress towards PT goals: Progressing toward goals    Frequency    Min 3X/week      PT Plan Current plan remains appropriate    Co-evaluation  AM-PAC PT "6 Clicks" Mobility   Outcome Measure  Help needed turning from your back to your side while in a flat bed without using bedrails?: A Little Help needed moving from lying on your back to sitting on the side of a flat bed without using bedrails?: A Little Help needed moving to and from a bed to a chair (including a wheelchair)?: A Little Help needed standing up from a chair using your arms (e.g., wheelchair or bedside chair)?: A Little Help needed to walk in hospital room?: A Little Help needed climbing 3-5 steps with a railing? : A Little 6 Click Score: 18    End of Session Equipment Utilized During Treatment: Gait belt Activity Tolerance: Patient tolerated treatment well Patient left: in  chair;with call bell/phone within reach Nurse Communication: Mobility status PT Visit Diagnosis: Muscle weakness (generalized) (M62.81);Difficulty in walking, not elsewhere classified (R26.2)     Time: 2595-6387 PT Time Calculation (min) (ACUTE ONLY): 19 min  Charges:  $Gait Training: 8-22 mins                     Lanney Gins, PT, DPT Supplemental Physical Therapist 01/09/19 10:02 AM Pager: [phone] Office: [phone]

## 2019-01-09 NOTE — Progress Notes (Signed)
Instruction provided to patient of expectation of her being OOB walking or in chair 6 hours a day minimum and use of Incentive Spirometer ten times an hour while awake for prevention of blood clots and pneumonia. Patient verbalizes understanding; Physical Therapist reinforced above education. Donne Hazel, RN

## 2019-01-09 NOTE — Progress Notes (Signed)
ERAS education reinforced. Did patient attend class prior to procedure? Yes [ x  ] No [   ] Discussed: Pain Control [   ] Mobility [  x ] Diet [   ] Other [  x ]  Patient standing in room looking through closet. Son in room and reports patient lifting her luggage. Reinforced importance of not lifting anything heavy until surgeon okays. IS to 1000 now (500 two days ago). Reports pain during inspiration. Reinforced importance of using the IS every hour x 10 times. Denies pain. Will follow. Pecolia Ades, RN, BSN Quality Program Coordinator, Enhanced Recovery after Surgery 01/09/19 4:32 PM

## 2019-01-10 MED ORDER — FERROUS SULFATE 325 (65 FE) MG PO TABS: 325.0000 mg | ORAL_TABLET | Freq: Three times a day (TID) | ORAL | 3 refills | Status: DC

## 2019-01-10 MED ORDER — ADULT MULTIVITAMIN W/MINERALS CH: 1.0000 | ORAL_TABLET | Freq: Every day | ORAL | Status: AC

## 2019-01-10 MED ORDER — GABAPENTIN 300 MG PO CAPS: 300.0000 mg | ORAL_CAPSULE | Freq: Two times a day (BID) | ORAL | 0 refills | Status: DC

## 2019-01-10 MED ORDER — OXYCODONE HCL 5 MG PO TABS: 5.0000 mg | ORAL_TABLET | Freq: Four times a day (QID) | ORAL | 0 refills | Status: DC | PRN

## 2019-01-10 MED ORDER — IBUPROFEN 800 MG PO TABS: 400.0000 mg | ORAL_TABLET | Freq: Four times a day (QID) | ORAL | 0 refills | Status: AC | PRN

## 2019-01-10 MED ORDER — ACETAMINOPHEN 325 MG PO TABS: 650.0000 mg | ORAL_TABLET | Freq: Four times a day (QID) | ORAL | Status: AC | PRN

## 2019-01-10 NOTE — Discharge Summary (Signed)
Physician Discharge Summary  Patient ID: Danielle Villa MRN: [id number] DOB/AGE: Feb 16, 1961 58 y.o.  Admit date: 01/06/2019 Discharge date: 01/17/2019  Admission Diagnoses: diverticulitis with persistent abscess  Discharge Diagnoses:  Principal Problem:   Diverticulitis of colon s/p colectomy 01/06/2019 Active Problems:   HYPERCHOLESTEROLEMIA   Encounter for disability assessment   Sleep-wake cycle disorder   Diverticulitis   Refusal of blood transfusions as patient is Jehovah's Witness   Discharged Condition: good  Hospital Course: Admitted for post-operative care after laparoscopic converted to open extended left hemicolectomy with colorectal anastomosis on 01/06/19.  Her diet was gradually advanced and she worked with PT/OT to improve her mobility and physical conditioning. She did have acute blood loss anemia which was stable and treated with MVI/ iron supplementation given her Jehovah's Witness status. She progressed appropriately and was able to be discharged home on Saturday, February 1. Unfortunately she was not able to fill her prescriptions that evening as her pharmacy was closed.  On Sunday she was experiencing chest and abdominal pain and returned to the emergency room by EMS. She was worked up with labs, urine cultures, CT scan.  Her CT scan did not demonstrate any complication of her surgery, she did not have any leukocytosis or other significant lab abnormalities nor fever.  She was readmitted with possible ileus.   She was observed in the hospital really with no additional medical treatment aside from low rate IV fluids and oral pain/ nausea medications for several more days and was able to tolerate a diet, ambulating independently, continued to have bowel movements and pass flatus, continued to have a benign abdominal exam, and remained afebrile with completely normal vital signs throughout her stay.  On February 5, cultures of her urine from the emergency room return positive  for Enterococcus faecalis and she was started on Macrobid.  She also experienced headache and right shoulder pain on this day which ultimately resolved.  On the morning of February 6, the patient was feeling well and stated that she was ready to go home.  She remained medically stable and cleared for discharge.   Discharge Exam: Blood pressure 95/63, pulse (!) 102, temperature 99 F (37.2 C), resp. rate 16, height 5\' 2"  (1.575 m), weight 62.8 kg, SpO2 96 %. See rounding physician's note  Disposition:   Discharge Instructions    Call MD for:   Complete by:  As directed    FEVER > 101.5 F  (temperatures < 101.5 F are not significant)   Call MD for:  extreme fatigue   Complete by:  As directed    Call MD for:  persistant dizziness or light-headedness   Complete by:  As directed    Call MD for:  persistant nausea and vomiting   Complete by:  As directed    Call MD for:  redness, tenderness, or signs of infection (pain, swelling, redness, odor or green/yellow discharge around incision site)   Complete by:  As directed    Call MD for:  severe uncontrolled pain   Complete by:  As directed    Diet - low sodium heart healthy   Complete by:  As directed    Start with a bland diet such as soups, liquids, starchy foods, low fat foods, etc. the first few days at home. Gradually advance to a solid, low-fat, high fiber diet by the end of the first week at home.   Add a fiber supplement to your diet (Metamucil, etc) If you feel full, bloated, or  constipated, stay on a full liquid or pureed/blenderized diet for a few days until you feel better and are no longer constipated.   Discharge instructions   Complete by:  As directed    See Discharge Instructions If you are not getting better after two weeks or are noticing you are getting worse, contact our office [phone] for further advice.  We may need to adjust your medications, re-evaluate you in the office, send you to the emergency room, or  see what other things we can do to help. The clinic staff is available to answer your questions during regular business hours (8:30am-5pm).  Please don't hesitate to call and ask to speak to one of our nurses for clinical concerns.    A surgeon from [facility] is always on call at the hospitals 24 hours/day If you have a medical emergency, go to the nearest emergency room or call 911.   Discharge wound care:   Complete by:  As directed    It is good for closed incisions and even open wounds to be washed every day.  Shower every day.  Short baths are fine.  Wash the incisions and wounds clean with soap & water.     You may leave closed incisions open to air if it is dry.   You may cover the incision with clean gauze & replace it after your daily shower for comfort.   If you have skin tapes (Steristrips) or skin glue (Dermabond) on your incision, leave them in place.  They will fall off on their own like a scab.  You may trim any edges that curl up with clean scissors.    If you have skin staples, set up an appointment for them to be removed in the office in 10 days after surgery.  If you have a drain, wash around the skin exit site with soap & water and place a new dressing of gauze or band aid around the skin every day.  Keep the drain site clean & dry.   Driving Restrictions   Complete by:  As directed    You may drive when: - you are no longer taking narcotic prescription pain medication - you can comfortably wear a seatbelt - you can safely make sudden turns/stops without pain.   For home use only DME Walker rolling   Complete by:  As directed    Patient needs a walker to treat with the following condition:  Physical deconditioning   Increase activity slowly   Complete by:  As directed    Start light daily activities --- self-care, walking, climbing stairs- beginning the day after surgery.  Gradually increase activities as tolerated.  Control your pain to be active.  Stop  when you are tired.  Ideally, walk several times a day, eventually an hour a day.   Most people are back to most day-to-day activities in a few weeks.  It takes 4-6 weeks to get back to unrestricted, intense activity. If you can walk 30 minutes without difficulty, it is safe to try more intense activity such as jogging, treadmill, bicycling, low-impact aerobics, swimming, etc. Save the most intensive and strenuous activity for last (Usually 4-8 weeks after surgery) such as sit-ups, heavy lifting, contact sports, etc.  Refrain from any intense heavy lifting or straining until you are off narcotics for pain control.  You will have off days, but things should improve week-by-week. DO NOT PUSH THROUGH PAIN.  Let pain be your guide: If it  hurts to do something, don't do it.   Lifting restrictions   Complete by:  As directed    If you can walk 30 minutes without difficulty, it is safe to try more intense activity such as jogging, treadmill, bicycling, low-impact aerobics, swimming, etc. Save the most intensive and strenuous activity for last (Usually 4-8 weeks after surgery) such as sit-ups, heavy lifting, contact sports, etc.   Refrain from any intense heavy lifting or straining until you are off narcotics for pain control.  You will have off days, but things should improve week-by-week. DO NOT PUSH THROUGH PAIN.  Let pain be your guide: If it hurts to do something, don't do it.  Pain is your body warning you to avoid that activity for another week until the pain goes down.   May shower / Bathe   Complete by:  As directed    May walk up steps   Complete by:  As directed    Sexual Activity Restrictions   Complete by:  As directed    You may have sexual intercourse when it is comfortable. If it hurts to do something, stop.     Allergies as of 01/11/2019      Reactions   Other    BLOOD PRODUCT REFUSAL       Medication List    TAKE these medications   acetaminophen 325 MG tablet Commonly known as:   TYLENOL Take 2 tablets (650 mg total) by mouth every 6 (six) hours as needed (Take tylenol and ibuprofen around the clock for the first 1-2 days after you get home, then take as needed.).   aspirin 81 MG EC tablet Commonly known as:  aspirin EC Take 1 tablet (81 mg total) by mouth daily. What changed:  when to take this   atorvastatin 20 MG tablet Commonly known as:  LIPITOR Take 1 tablet (20 mg total) by mouth every evening.   ibuprofen 800 MG tablet Commonly known as:  ADVIL,MOTRIN Take 0.5 tablets (400 mg total) by mouth every 6 (six) hours as needed for mild pain or moderate pain (Take tylenol and ibuprofen aroudn the clock for the first 1-2 days after you get home. Then take as needed.).   multivitamin with minerals Tabs tablet Take 1 tablet by mouth daily.   oxyCODONE 5 MG immediate release tablet Commonly known as:  Oxy IR/ROXICODONE Take 1 tablet (5 mg total) by mouth every 6 (six) hours as needed for severe pain (For pain not controlled by other medications.).   PARoxetine 20 MG tablet Commonly known as:  PAXIL Take 1 tablet (20 mg total) by mouth daily.   traMADol 50 MG tablet Commonly known as:  ULTRAM Take 1 tablet (50 mg total) by mouth every 6 (six) hours as needed. What changed:  reasons to take this   traZODone 50 MG tablet Commonly known as:  DESYREL Take 1 tablet (50 mg total) by mouth at bedtime as needed. for sleep What changed:  when to take this            Durable Medical Equipment  (From admission, onward)         Start     Ordered   01/10/19 0000  For home use only DME Walker rolling    Question:  Patient needs a walker to treat with the following condition  Answer:  Physical deconditioning   01/10/19 1137           Discharge Care Instructions  (From admission, onward)  Start     Ordered   01/11/19 0000  Discharge wound care:    Comments:  It is good for closed incisions and even open wounds to be washed every day.  Shower  every day.  Short baths are fine.  Wash the incisions and wounds clean with soap & water.     You may leave closed incisions open to air if it is dry.   You may cover the incision with clean gauze & replace it after your daily shower for comfort.   If you have skin tapes (Steristrips) or skin glue (Dermabond) on your incision, leave them in place.  They will fall off on their own like a scab.  You may trim any edges that curl up with clean scissors.    If you have skin staples, set up an appointment for them to be removed in the office in 10 days after surgery.  If you have a drain, wash around the skin exit site with soap & water and place a new dressing of gauze or band aid around the skin every day.  Keep the drain site clean & dry.   01/11/19 0837         Follow-up Information    Clovis Riley, MD Follow up on 01/23/2019.   Specialty:  General Surgery Why:  Your appointment is 02/13 at 9:50 am. Please arrive 10-15 minutes early to fill out any paperwork. Contact information: [address] [place] [address] [facility], [place]. Schedule an appointment as soon as possible for a visit.   Specialty:  General Surgery Why:  Come in to have skin staples removed by one of our nurses Contact information: [address] [place] [phone]          Signed: Clovis Riley 01/17/2019, 7:29 AM

## 2019-01-10 NOTE — Progress Notes (Signed)
Occupational Therapy Treatment Patient Details Name: Danielle Villa MRN: [id number] DOB: September 24, 1961 Today's Date: 01/10/2019    History of present illness Pt is a 58yo female who undersent a laporoscopic converted to an open extended L hemicolectomy secondary to diverticulitis.     OT comments  Practiced tub transfer: walked there and 1/2 loop prior to returning to room. Pt mod I with toileting   Follow Up Recommendations  No OT follow up    Equipment Recommendations  3 in 1 bedside commode;Tub/shower seat(has medicaid:  will shower seat be covered?)    Recommendations for Other Services      Precautions / Restrictions Precautions Precautions: Fall Precaution Comments: patient with drain at R abdomen Restrictions Weight Bearing Restrictions: No       Mobility Bed Mobility         Supine to sit: Min guard     General bed mobility comments: used bedrail.  Demonstrated using elbow to push up from  Transfers   Equipment used: Rolling walker (2 wheeled)   Sit to Stand: Modified independent (Device/Increase time)              Balance                                           ADL either performed or assessed with clinical judgement   ADL                           Toilet Transfer: Modified Programmer, applications Details (indicate cue type and reason): pt is toileting herself     Tub/ Banker: Min guard;Tub transfer;Ambulation;Grab bars;Shower seat     General ADL Comments: pt verbalizes use of AE; did not feel she needed to practice again.  Practiced tub transfer--pt has a grab bar; would like a tub seat, if it is covered     Vision       Perception     Praxis      Cognition Arousal/Alertness: Awake/alert Behavior During Therapy: WFL for tasks assessed/performed Overall Cognitive Status: Within Functional Limits for tasks assessed                                           Exercises     Shoulder Instructions       General Comments      Pertinent Vitals/ Pain       Pain Score: 5  Pain Location: abdomen Pain Descriptors / Indicators: Aching Pain Intervention(s): Limited activity within patient's tolerance;Monitored during session;Premedicated before session;Repositioned  Home Living                                          Prior Functioning/Environment              Frequency           Progress Toward Goals  OT Goals(current goals can now be found in the care plan section)  Progress towards OT goals: Progressing toward goals     Plan      Co-evaluation  AM-PAC OT "6 Clicks" Daily Activity     Outcome Measure   Help from another person eating meals?: None Help from another person taking care of personal grooming?: A Little Help from another person toileting, which includes using toliet, bedpan, or urinal?: A Little Help from another person bathing (including washing, rinsing, drying)?: A Little Help from another person to put on and taking off regular upper body clothing?: A Little Help from another person to put on and taking off regular lower body clothing?: A Little 6 Click Score: 19    End of Session    OT Visit Diagnosis: Muscle weakness (generalized) (M62.81)   Activity Tolerance Patient tolerated treatment well   Patient Left in bed;with call bell/phone within reach;with family/visitor present   Nurse Communication          Time: 3474-2595 OT Time Calculation (min): 35 min  Charges: OT General Charges $OT Visit: 1 Visit OT Treatments $Self Care/Home Management : 23-37 mins  Lesle Chris, OTR/L Acute Rehabilitation Services [phone] [facility] pager [phone] office 01/10/2019   [place] 01/10/2019, 2:30 PM

## 2019-01-10 NOTE — Discharge Summary (Deleted)
  The note originally documented on this encounter has been moved the the encounter in which it belongs.  

## 2019-01-10 NOTE — Progress Notes (Signed)
Physical Therapy Treatment Patient Details Name: Danielle Villa MRN: [id number] DOB: 11/10/1961 Today's Date: 01/10/2019    History of Present Illness Pt is a 58yo female who undersent a laporoscopic converted to an open extended L hemicolectomy secondary to diverticulitis.      PT Comments    Good progression towards goals. Up in restroom independently upon PT arrival. Ambulating in hallway today without need for RW - does have continued slow pace of gait and guarded posture throughout, however, improved since last session. Will continue to follow.     Follow Up Recommendations  No PT follow up     Equipment Recommendations  Rolling walker with 5" wheels;None recommended by PT(may not need RW as she continues to progress)    Recommendations for Other Services       Precautions / Restrictions Precautions Precautions: Fall Precaution Comments: patient with drain at R abdomen Restrictions Weight Bearing Restrictions: No    Mobility  Bed Mobility               General bed mobility comments: up in restroom "washing up" independently upon PT arrival  Transfers Overall transfer level: Needs assistance Equipment used: None Transfers: Sit to/from Stand Sit to Stand: Supervision         General transfer comment: supervision for safety  Ambulation/Gait Ambulation/Gait assistance: Min guard;Supervision Gait Distance (Feet): (1 lap around unit) Assistive device: None Gait Pattern/deviations: Step-through pattern;Decreased stride length Gait velocity: decreased   General Gait Details: progression to ambulation without RW - continued slow pace of gait, but slightly improved since last session   Stairs             Wheelchair Mobility    Modified Rankin (Stroke Patients Only)       Balance Overall balance assessment: Mild deficits observed, not formally tested                                          Cognition Arousal/Alertness:  Awake/alert Behavior During Therapy: WFL for tasks assessed/performed Overall Cognitive Status: Within Functional Limits for tasks assessed                                        Exercises      General Comments        Pertinent Vitals/Pain Pain Assessment: 0-10 Pain Score: 7  Pain Location: abdomen Pain Descriptors / Indicators: Aching;Discomfort;Guarding Pain Intervention(s): Limited activity within patient's tolerance;Monitored during session;Repositioned    Home Living                      Prior Function            PT Goals (current goals can now be found in the care plan section) Acute Rehab PT Goals Patient Stated Goal: to get rid of the pain PT Goal Formulation: With patient Time For Goal Achievement: 01/21/19 Potential to Achieve Goals: Good Progress towards PT goals: Progressing toward goals    Frequency    Min 3X/week      PT Plan Current plan remains appropriate    Co-evaluation              AM-PAC PT "6 Clicks" Mobility   Outcome Measure  Help needed turning from your back to your side while in a  flat bed without using bedrails?: A Little Help needed moving from lying on your back to sitting on the side of a flat bed without using bedrails?: A Little Help needed moving to and from a bed to a chair (including a wheelchair)?: A Little Help needed standing up from a chair using your arms (e.g., wheelchair or bedside chair)?: A Little Help needed to walk in hospital room?: A Little Help needed climbing 3-5 steps with a railing? : A Little 6 Click Score: 18    End of Session Equipment Utilized During Treatment: Gait belt Activity Tolerance: Patient tolerated treatment well Patient left: in chair;with call bell/phone within reach Nurse Communication: Mobility status PT Visit Diagnosis: Muscle weakness (generalized) (M62.81);Difficulty in walking, not elsewhere classified (R26.2)     Time: 5284-1324 PT Time  Calculation (min) (ACUTE ONLY): 14 min  Charges:  $Gait Training: 8-22 mins                      Lanney Gins, PT, DPT Supplemental Physical Therapist 01/10/19 10:09 AM Pager: [phone] Office: [phone]

## 2019-01-10 NOTE — Progress Notes (Signed)
S: Continues to progress. Eating ok, drinking plenty. No nausea per se but fills up quickly. Continues to have loose bowel movements, last one this morning, nonbloody. No flatus.  She is walking and using IS.   Meds- scheduled tylenol/ gabapentin. Received oxycodone x 2 and tramadol x 2 last 24h, has not had any IV meds in >24h. No PRNs given for nausea.  Vitals, labs, intake/output, and orders reviewed at this time. Tmax 99.3, 98.2 this morning. HR 80s, Normotensive, Sats 99% room air.  PO 1240, UOP 700 + 1 void, JP 95, BM x 1.  No labs today.   Gen: A&Ox3, no distress, sitting up on side of bed  H&N: EOMI, atraumatic, neck supple Chest: unlabored respirations, RRR with strong distal pulses Abd: very soft, appropriately tender (less than previously), mildly distended, incisions c/d/i without cellulitis. JP output serosanguinous.  Ext: warm, no edema, no calf tenderness Neuro: grossly normal   Lines/tubes/drains: PIV, JP  A/P:  POD 4 s/p lap converted to open extended left hemicolectomy with low colorectal anastomosis for diverticulitis. Path reviewed- no malignancy, inflamed segment strictured down to 0.1cm lumen  Continue regular diet, ensure, MVI  Continue ambulating as much as tolerated; PT/OT following, pt requesting walker when she goes home  Encourage pulmonary toilet/ IS- she is doing better with this  Post-op pain- continue multimodal pain regimen and minimize narcotics.   Acute blood loss anemia- hgb 7.0 yesterday (stable), baseline is between 11-12.  Clinically no apparent ongoing bleeding. Continue iron supplement. Patient is Jehovah's Witness  DVT ppx- SCDs, ambulation, SQH started 1/30 when hgb stable  ID- afebrile. Received cefotetan x 2 periop doses  Plan discharge home tomorrow. Remove JP prior to discharge. Will arrange follow up around 2/10 for staple removal. Will place case management request to arrange home walker.    Romana Juniper, MD [facility], [place] Pager [phone]

## 2019-01-10 NOTE — Plan of Care (Signed)
Patient lying in bed this morning. No complaints of pain voiced at this time. Will continue to monitor.

## 2019-01-11 DIAGNOSIS — Z531 Procedure and treatment not carried out because of patient's decision for reasons of belief and group pressure: Secondary | ICD-10-CM

## 2019-01-11 DIAGNOSIS — K5792 Diverticulitis of intestine, part unspecified, without perforation or abscess without bleeding: Secondary | ICD-10-CM | POA: Diagnosis not present

## 2019-01-11 DIAGNOSIS — K5732 Diverticulitis of large intestine without perforation or abscess without bleeding: Secondary | ICD-10-CM | POA: Diagnosis not present

## 2019-01-11 DIAGNOSIS — IMO0001 Reserved for inherently not codable concepts without codable children: Secondary | ICD-10-CM

## 2019-01-11 NOTE — Progress Notes (Addendum)
NCM spoke to pt and requested DME. Contacted [facility] for RW with seat, tub bench and 3n1 bedside commode for home. Jonnie Finner RN CCM Case Mgmt phone [phone]

## 2019-01-11 NOTE — Discharge Summary (Addendum)
Physician Discharge Summary    Patient ID: Danielle Villa MRN: [id number] DOB/AGE: 59-Mar-1962  59 y.o.  Patient Care Team: Sela Hilding, MD as PCP - General (Family Medicine)  Admit date: 01/06/2019  Discharge date: 01/11/2019  Hospital Stay = 5 days    Discharge Diagnoses:  Principal Problem:   Diverticulitis of colon Active Problems:   Refusal of blood transfusions as patient is Jehovah's Witness   HYPERCHOLESTEROLEMIA   Encounter for disability assessment   Sleep-wake cycle disorder   Diverticulitis   5 Days Post-Op  01/06/2019  POST-OPERATIVE DIAGNOSIS:    DIVERTICULITIS  SURGERY:  01/06/2019  Procedure(s): LAPAROSCOPIC EXTENDED LEFT HEMI-  COLECTOMY CYSTOSCOPY WITH BILATERAL STENT PLACEMENT LEFT OOPHORECTOMY PROCTOSCOPY APPLICATION OF WOUND VAC  SURGEON:    Surgeon(s): Clovis Riley, MD Raynelle Bring, MD Excell Seltzer, MD  Consults: Physical and Occupational Therapy.  Hospital Course:   The patient underwent the surgery above.  Postoperatively, the patient gradually mobilized and advanced to a solid diet.  Pain and other symptoms were treated aggressively.  She worked with PT/OT to improve her mobility and physical conditioning. She did have acute blood loss anemia which was stable and treated with MVI/ iron supplementation given her Jehovah's Witness status.   By the time of discharge, the patient was walking well the hallways, eating food, having flatus.  Pain was well-controlled on an oral medications.  Physical therapy recommended a rolling walker.  Occupational therapy recommended a 3 and 1 tub.  These were ordered.  Based on meeting discharge criteria and continuing to recover, I felt it was safe for the patient to be discharged from the hospital to further recover with close followup. Postoperative recommendations were discussed in detail.  They are written as well.  Discharged Condition: good  Discharge Exam: Blood pressure  119/69, pulse 96, temperature 98.4 F (36.9 C), temperature source Oral, resp. rate 18, height 5\' 2"  (1.575 m), weight 62.8 kg, SpO2 97 %.  General: Pt awake/alert/oriented x4 in No acute distress Eyes: PERRL, normal EOM.  Sclera clear.  No icterus Neuro: CN II-XII intact w/o focal sensory/motor deficits. Lymph: No head/neck/groin lymphadenopathy Psych:  No delerium/psychosis/paranoia HENT: Normocephalic, Mucus membranes moist.  No thrush Neck: Supple, No tracheal deviation Chest: No chest wall pain w good excursion CV:  Pulses intact.  Regular rhythm MS: Normal AROM mjr joints.  No obvious deformity Abdomen: Soft.  Nondistended.  Mildly tender at incisions only.  No evidence of peritonitis.  No incarcerated hernias. Ext:  SCDs BLE.  No mjr edema.  No cyanosis Skin: No petechiae / purpura   Disposition:   Follow-up Information    Clovis Riley, MD Follow up on 01/23/2019.   Specialty:  General Surgery Why:  Your appointment is 02/13 at 9:50 am. Please arrive 10-15 minutes early to fill out any paperwork. Contact information: [address] [place] [address] [phone]           Discharge disposition: 01-Home or Self Care       Discharge Instructions    Call MD for:   Complete by:  As directed    FEVER > 101.5 F  (temperatures < 101.5 F are not significant)   Call MD for:  extreme fatigue   Complete by:  As directed    Call MD for:  persistant dizziness or light-headedness   Complete by:  As directed    Call MD for:  persistant nausea and vomiting   Complete by:  As directed  Call MD for:  redness, tenderness, or signs of infection (pain, swelling, redness, odor or green/yellow discharge around incision site)   Complete by:  As directed    Call MD for:  severe uncontrolled pain   Complete by:  As directed    Diet - low sodium heart healthy   Complete by:  As directed    Start with a bland diet such as soups, liquids, starchy  foods, low fat foods, etc. the first few days at home. Gradually advance to a solid, low-fat, high fiber diet by the end of the first week at home.   Add a fiber supplement to your diet (Metamucil, etc) If you feel full, bloated, or constipated, stay on a full liquid or pureed/blenderized diet for a few days until you feel better and are no longer constipated.   Discharge instructions   Complete by:  As directed    See Discharge Instructions If you are not getting better after two weeks or are noticing you are getting worse, contact our office [phone] for further advice.  We may need to adjust your medications, re-evaluate you in the office, send you to the emergency room, or see what other things we can do to help. The clinic staff is available to answer your questions during regular business hours (8:30am-5pm).  Please don't hesitate to call and ask to speak to one of our nurses for clinical concerns.    A surgeon from [facility] is always on call at the hospitals 24 hours/day If you have a medical emergency, go to the nearest emergency room or call 911.   Discharge wound care:   Complete by:  As directed    It is good for closed incisions and even open wounds to be washed every day.  Shower every day.  Short baths are fine.  Wash the incisions and wounds clean with soap & water.     You may leave closed incisions open to air if it is dry.   You may cover the incision with clean gauze & replace it after your daily shower for comfort.   If you have skin tapes (Steristrips) or skin glue (Dermabond) on your incision, leave them in place.  They will fall off on their own like a scab.  You may trim any edges that curl up with clean scissors.    If you have skin staples, set up an appointment for them to be removed in the office in 10 days after surgery.  If you have a drain, wash around the skin exit site with soap & water and place a new dressing of gauze or band aid around the  skin every day.  Keep the drain site clean & dry.   Driving Restrictions   Complete by:  As directed    You may drive when: - you are no longer taking narcotic prescription pain medication - you can comfortably wear a seatbelt - you can safely make sudden turns/stops without pain.   For home use only DME Walker rolling   Complete by:  As directed    Patient needs a walker to treat with the following condition:  Physical deconditioning   Increase activity slowly   Complete by:  As directed    Start light daily activities --- self-care, walking, climbing stairs- beginning the day after surgery.  Gradually increase activities as tolerated.  Control your pain to be active.  Stop when you are tired.  Ideally, walk several times a day,  eventually an hour a day.   Most people are back to most day-to-day activities in a few weeks.  It takes 4-6 weeks to get back to unrestricted, intense activity. If you can walk 30 minutes without difficulty, it is safe to try more intense activity such as jogging, treadmill, bicycling, low-impact aerobics, swimming, etc. Save the most intensive and strenuous activity for last (Usually 4-8 weeks after surgery) such as sit-ups, heavy lifting, contact sports, etc.  Refrain from any intense heavy lifting or straining until you are off narcotics for pain control.  You will have off days, but things should improve week-by-week. DO NOT PUSH THROUGH PAIN.  Let pain be your guide: If it hurts to do something, don't do it.   Lifting restrictions   Complete by:  As directed    If you can walk 30 minutes without difficulty, it is safe to try more intense activity such as jogging, treadmill, bicycling, low-impact aerobics, swimming, etc. Save the most intensive and strenuous activity for last (Usually 4-8 weeks after surgery) such as sit-ups, heavy lifting, contact sports, etc.   Refrain from any intense heavy lifting or straining until you are off narcotics for pain control.  You  will have off days, but things should improve week-by-week. DO NOT PUSH THROUGH PAIN.  Let pain be your guide: If it hurts to do something, don't do it.  Pain is your body warning you to avoid that activity for another week until the pain goes down.   May shower / Bathe   Complete by:  As directed    May walk up steps   Complete by:  As directed    Sexual Activity Restrictions   Complete by:  As directed    You may have sexual intercourse when it is comfortable. If it hurts to do something, stop.      Allergies as of 01/11/2019      Reactions   Other    BLOOD PRODUCT REFUSAL       Medication List    TAKE these medications   acetaminophen 325 MG tablet Commonly known as:  TYLENOL Take 2 tablets (650 mg total) by mouth every 6 (six) hours as needed (Take tylenol and ibuprofen around the clock for the first 1-2 days after you get home, then take as needed.).   aspirin 81 MG EC tablet Commonly known as:  aspirin EC Take 1 tablet (81 mg total) by mouth daily. What changed:  when to take this   atorvastatin 20 MG tablet Commonly known as:  LIPITOR Take 1 tablet (20 mg total) by mouth every evening.   ferrous sulfate 325 (65 FE) MG tablet Take 1 tablet (325 mg total) by mouth 3 (three) times daily with meals.   gabapentin 300 MG capsule Commonly known as:  NEURONTIN Take 1 capsule (300 mg total) by mouth 2 (two) times daily.   ibuprofen 800 MG tablet Commonly known as:  ADVIL,MOTRIN Take 0.5 tablets (400 mg total) by mouth every 6 (six) hours as needed for mild pain or moderate pain (Take tylenol and ibuprofen aroudn the clock for the first 1-2 days after you get home. Then take as needed.).   multivitamin with minerals Tabs tablet Take 1 tablet by mouth daily.   oxyCODONE 5 MG immediate release tablet Commonly known as:  Oxy IR/ROXICODONE Take 1 tablet (5 mg total) by mouth every 6 (six) hours as needed for severe pain (For pain not controlled by other medications.).  pantoprazole 20 MG tablet Commonly known as:  PROTONIX Take 1 tablet (20 mg total) by mouth 2 (two) times daily.   PARoxetine 20 MG tablet Commonly known as:  PAXIL Take 1 tablet (20 mg total) by mouth daily.   traMADol 50 MG tablet Commonly known as:  ULTRAM Take 1 tablet (50 mg total) by mouth every 6 (six) hours as needed.   traZODone 50 MG tablet Commonly known as:  DESYREL Take 1 tablet (50 mg total) by mouth at bedtime as needed. for sleep What changed:  when to take this            Durable Medical Equipment  (From admission, onward)         Start     Ordered   01/10/19 1533  For home use only DME Shower stool  Once     01/10/19 1533   01/10/19 0000  For home use only DME Walker rolling    Question:  Patient needs a walker to treat with the following condition  Answer:  Physical deconditioning   01/10/19 1137           Discharge Care Instructions  (From admission, onward)         Start     Ordered   01/11/19 0000  Discharge wound care:    Comments:  It is good for closed incisions and even open wounds to be washed every day.  Shower every day.  Short baths are fine.  Wash the incisions and wounds clean with soap & water.     You may leave closed incisions open to air if it is dry.   You may cover the incision with clean gauze & replace it after your daily shower for comfort.   If you have skin tapes (Steristrips) or skin glue (Dermabond) on your incision, leave them in place.  They will fall off on their own like a scab.  You may trim any edges that curl up with clean scissors.    If you have skin staples, set up an appointment for them to be removed in the office in 10 days after surgery.  If you have a drain, wash around the skin exit site with soap & water and place a new dressing of gauze or band aid around the skin every day.  Keep the drain site clean & dry.   01/11/19 0837          Significant Diagnostic Studies:  Results for orders placed  or performed during the hospital encounter of 01/06/19 (from the past 72 hour(s))  CBC     Status: Abnormal   Collection Time: 01/09/19  4:49 AM  Result Value Ref Range   WBC 8.2 4.0 - 10.5 K/uL   RBC 2.56 (L) 3.87 - 5.11 MIL/uL   Hemoglobin 7.0 (L) 12.0 - 15.0 g/dL   HCT 23.5 (L) 36.0 - 46.0 %   MCV 91.8 80.0 - 100.0 fL   MCH 27.3 26.0 - 34.0 pg   MCHC 29.8 (L) 30.0 - 36.0 g/dL   RDW 16.2 (H) 11.5 - 15.5 %   Platelets 254 150 - 400 K/uL   nRBC 0.0 0.0 - 0.2 %    Comment: Performed at [facility], [place] [address], [place], [place] [address]  Basic metabolic panel     Status: Abnormal   Collection Time: 01/09/19  4:49 AM  Result Value Ref Range   Sodium 141 135 - 145 mmol/L   Potassium 3.7 3.5 -  5.1 mmol/L   Chloride 111 98 - 111 mmol/L   CO2 25 22 - 32 mmol/L   Glucose, Bld 90 70 - 99 mg/dL   BUN 8 6 - 20 mg/dL   Creatinine, Ser 0.50 0.44 - 1.00 mg/dL   Calcium 8.0 (L) 8.9 - 10.3 mg/dL   GFR calc non Af Amer >60 >60 mL/min   GFR calc Af Amer >60 >60 mL/min   Anion gap 5 5 - 15    Comment: Performed at [facility], [place] [address], [place], [place] [address]    Dg Chest 2 View  Result Date: 01/05/2019 CLINICAL DATA:  Chest and back pain EXAM: CHEST - 2 VIEW COMPARISON:  08/16/2018 FINDINGS: The heart size and mediastinal contours are within normal limits. Both lungs are clear. The visualized skeletal structures are unremarkable. IMPRESSION: No active cardiopulmonary disease. Electronically Signed   By: Donavan Foil M.D.   On: 01/05/2019 21:30   Ct Angio Chest Pe W And/or Wo Contrast  Result Date: 01/05/2019 CLINICAL DATA:  Sharp chest pain. Elevated D-dimer. Abdominal pain. Diverticulitis. EXAM: CT ANGIOGRAPHY CHEST CT ABDOMEN AND PELVIS WITH CONTRAST TECHNIQUE: Multidetector CT imaging of the chest was performed using the standard protocol during bolus administration of intravenous contrast. Multiplanar CT image reconstructions and MIPs  were obtained to evaluate the vascular anatomy. Multidetector CT imaging of the abdomen and pelvis was performed using the standard protocol during bolus administration of intravenous contrast. CONTRAST:  169mL ISOVUE-370 IOPAMIDOL (ISOVUE-370) INJECTION 76% COMPARISON:  Body CT 12/06/2018 FINDINGS: CTA CHEST FINDINGS Cardiovascular: Satisfactory opacification of the pulmonary arteries to the segmental level. No evidence of pulmonary embolism. Normal heart size. No pericardial effusion. Mediastinum/Nodes: No enlarged mediastinal, hilar, or axillary lymph nodes. Thyroid gland, and trachea demonstrate no significant findings. Diffuse dilation of the esophagus. Lungs/Pleura: Lungs are clear. No pleural effusion or pneumothorax. Review of the MIP images confirms the above findings. CT ABDOMEN and PELVIS FINDINGS Hepatobiliary: No focal liver abnormality is seen. No gallstones, gallbladder wall thickening, or biliary dilatation. Pancreas: Unremarkable. No pancreatic ductal dilatation or surrounding inflammatory changes. Spleen: Normal in size without focal abnormality. Adrenals/Urinary Tract: Adrenal glands are unremarkable. Kidneys are without renal calculi, focal lesion, or hydronephrosis. Right renal cyst noted. Bladder is unremarkable. Stomach/Bowel: Stomach is within normal limits. No evidence of appendicitis. No evidence of small bowel wall thickening, distention, or inflammatory changes. Diffuse wall thickening of the sigmoid colon with multiple diverticula. Mild pericolonic inflammatory changes. Vascular/Lymphatic: No significant vascular findings are present. No enlarged abdominal or pelvic lymph nodes. Shotty mesenteric lymph nodes surround the sigmoid colon. Reproductive: Leiomyomatous uterus. Other: Small amount of pelvic ascites. Musculoskeletal: No acute or significant osseous findings. Review of the MIP images confirms the above findings. IMPRESSION: No evidence of pulmonary embolus or other significant  abnormality within the thorax. No evidence of significant abnormalities within the solid abdominal organs. Relatively long segment of irregular mucosal thickening of the sigmoid colon with multiple diverticula and pericolonic inflammatory changes. The findings may represent acute on chronic diverticulitis, however malignancy can not be excluded. Please correlate to the results of colonoscopy. Electronically Signed   By: Fidela Salisbury M.D.   On: 01/05/2019 23:22   Ct Abdomen Pelvis W Contrast  Result Date: 01/05/2019 CLINICAL DATA:  Sharp chest pain. Elevated D-dimer. Abdominal pain. Diverticulitis. EXAM: CT ANGIOGRAPHY CHEST CT ABDOMEN AND PELVIS WITH CONTRAST TECHNIQUE: Multidetector CT imaging of the chest was performed using the standard protocol during bolus administration of intravenous contrast. Multiplanar  CT image reconstructions and MIPs were obtained to evaluate the vascular anatomy. Multidetector CT imaging of the abdomen and pelvis was performed using the standard protocol during bolus administration of intravenous contrast. CONTRAST:  156mL ISOVUE-370 IOPAMIDOL (ISOVUE-370) INJECTION 76% COMPARISON:  Body CT 12/06/2018 FINDINGS: CTA CHEST FINDINGS Cardiovascular: Satisfactory opacification of the pulmonary arteries to the segmental level. No evidence of pulmonary embolism. Normal heart size. No pericardial effusion. Mediastinum/Nodes: No enlarged mediastinal, hilar, or axillary lymph nodes. Thyroid gland, and trachea demonstrate no significant findings. Diffuse dilation of the esophagus. Lungs/Pleura: Lungs are clear. No pleural effusion or pneumothorax. Review of the MIP images confirms the above findings. CT ABDOMEN and PELVIS FINDINGS Hepatobiliary: No focal liver abnormality is seen. No gallstones, gallbladder wall thickening, or biliary dilatation. Pancreas: Unremarkable. No pancreatic ductal dilatation or surrounding inflammatory changes. Spleen: Normal in size without focal abnormality.  Adrenals/Urinary Tract: Adrenal glands are unremarkable. Kidneys are without renal calculi, focal lesion, or hydronephrosis. Right renal cyst noted. Bladder is unremarkable. Stomach/Bowel: Stomach is within normal limits. No evidence of appendicitis. No evidence of small bowel wall thickening, distention, or inflammatory changes. Diffuse wall thickening of the sigmoid colon with multiple diverticula. Mild pericolonic inflammatory changes. Vascular/Lymphatic: No significant vascular findings are present. No enlarged abdominal or pelvic lymph nodes. Shotty mesenteric lymph nodes surround the sigmoid colon. Reproductive: Leiomyomatous uterus. Other: Small amount of pelvic ascites. Musculoskeletal: No acute or significant osseous findings. Review of the MIP images confirms the above findings. IMPRESSION: No evidence of pulmonary embolus or other significant abnormality within the thorax. No evidence of significant abnormalities within the solid abdominal organs. Relatively long segment of irregular mucosal thickening of the sigmoid colon with multiple diverticula and pericolonic inflammatory changes. The findings may represent acute on chronic diverticulitis, however malignancy can not be excluded. Please correlate to the results of colonoscopy. Electronically Signed   By: Fidela Salisbury M.D.   On: 01/05/2019 23:22   Dg C-arm 1-60 Min-no Report  Result Date: 01/06/2019 Fluoroscopy was utilized by the requesting physician.  No radiographic interpretation.    Past Medical History:  Diagnosis Date  . Anxiety   . Arthritis   . Back pain   . Depression   . Headache     Past Surgical History:  Procedure Laterality Date  . APPLICATION OF WOUND VAC  01/06/2019   Procedure: APPLICATION OF WOUND VAC;  Surgeon: Clovis Riley, MD;  Location: [facility];  Service: General;;  . cyst removed from ovary     . CYSTOSCOPY WITH STENT PLACEMENT Bilateral 01/06/2019   Procedure: CYSTOSCOPY WITH BILATERAL STENT  PLACEMENT;  Surgeon: Raynelle Bring, MD;  Location: [facility];  Service: Urology;  Laterality: Bilateral;  . LAPAROSCOPIC SIGMOID COLECTOMY N/A 01/06/2019   Procedure: LAPAROSCOPIC EXTENDED LEFT HEMI-  COLECTOMY;  Surgeon: Clovis Riley, MD;  Location: [facility];  Service: General;  Laterality: N/A;  . OOPHORECTOMY  01/06/2019   Procedure: LEFT OOPHORECTOMY;  Surgeon: Clovis Riley, MD;  Location: [facility];  Service: General;;  . PROCTOSCOPY  01/06/2019   Procedure: PROCTOSCOPY;  Surgeon: Clovis Riley, MD;  Location: [facility];  Service: General;;  . TONSILLECTOMY      Social History   Socioeconomic History  . Marital status: Divorced    Spouse name: Not on file  . Number of children: Not on file  . Years of education: Not on file  . Highest education level: Not on file  Occupational History  . Not on file  Social Needs  .  Financial resource strain: Not on file  . Food insecurity:    Worry: Not on file    Inability: Not on file  . Transportation needs:    Medical: Not on file    Non-medical: Not on file  Tobacco Use  . Smoking status: Former Research scientist (life sciences)  . Smokeless tobacco: Never Used  Substance and Sexual Activity  . Alcohol use: Yes    Comment: occasionaly  . Drug use: No  . Sexual activity: Not Currently  Lifestyle  . Physical activity:    Days per week: Not on file    Minutes per session: Not on file  . Stress: Not on file  Relationships  . Social connections:    Talks on phone: Not on file    Gets together: Not on file    Attends religious service: Not on file    Active member of club or organization: Not on file    Attends meetings of clubs or organizations: Not on file    Relationship status: Not on file  . Intimate partner violence:    Fear of current or ex partner: Not on file    Emotionally abused: Not on file    Physically abused: Not on file    Forced sexual activity: Not on file  Other Topics Concern  . Not on file  Social History Narrative  . Not on  file    Family History  Problem Relation Age of Onset  . Hypertension Mother   . Diabetes Mother   . Hypertension Maternal Grandmother     Current Facility-Administered Medications  Medication Dose Route Frequency Provider Last Rate Last Dose  . acetaminophen (TYLENOL) tablet 1,000 mg  1,000 mg Oral Q8H Romana Juniper A, MD   1,000 mg at 01/11/19 [phone]  . alum & mag hydroxide-simeth (MAALOX/MYLANTA) 200-200-20 MG/5ML suspension 30 mL  30 mL Oral Q6H PRN Romana Juniper A, MD      . diphenhydrAMINE (BENADRYL) capsule 25 mg  25 mg Oral Q6H PRN Clovis Riley, MD       Or  . diphenhydrAMINE (BENADRYL) injection 25 mg  25 mg Intravenous Q6H PRN Romana Juniper A, MD      . feeding supplement (ENSURE SURGERY) liquid 237 mL  237 mL Oral TID BM Romana Juniper A, MD   237 mL at 01/10/19 2035  . ferrous sulfate tablet 325 mg  325 mg Oral TID WC Romana Juniper A, MD   325 mg at 01/10/19 1743  . gabapentin (NEURONTIN) capsule 300 mg  300 mg Oral BID Romana Juniper A, MD   300 mg at 01/10/19 2205  . heparin injection 5,000 Units  5,000 Units Subcutaneous Q8H Clovis Riley, MD   5,000 Units at 01/11/19 [phone]  . HYDROmorphone (DILAUDID) injection 0.5 mg  0.5 mg Intravenous Q4H PRN Romana Juniper A, MD      . ketorolac (TORADOL) 30 MG/ML injection 15 mg  15 mg Intravenous Q6H PRN Romana Juniper A, MD      . methocarbamol (ROBAXIN) 500 mg in dextrose 5 % 50 mL IVPB  500 mg Intravenous Q6H PRN Clovis Riley, MD   Stopped at 01/06/19 1811  . metoCLOPramide (REGLAN) injection 10 mg  10 mg Intravenous Q6H PRN Romana Juniper A, MD      . metoprolol tartrate (LOPRESSOR) injection 5 mg  5 mg Intravenous Q6H PRN Romana Juniper A, MD      . multivitamin with minerals tablet 1 tablet  1 tablet Oral Daily Kae Heller,  Victorino Sparrow, MD   1 tablet at 01/10/19 1042  . ondansetron (ZOFRAN) tablet 4 mg  4 mg Oral Q6H PRN Clovis Riley, MD       Or  . ondansetron [facility]) injection 4 mg  4 mg Intravenous  Q6H PRN Clovis Riley, MD   4 mg at 01/07/19 0224  . oxyCODONE (Oxy IR/ROXICODONE) immediate release tablet 5 mg  5 mg Oral Q6H PRN Clovis Riley, MD   5 mg at 01/11/19 [phone]  . pantoprazole (PROTONIX) EC tablet 20 mg  20 mg Oral BID Romana Juniper A, MD   20 mg at 01/10/19 2208  . PARoxetine (PAXIL) tablet 20 mg  20 mg Oral Daily Romana Juniper A, MD   20 mg at 01/10/19 1042  . saccharomyces boulardii (FLORASTOR) capsule 250 mg  250 mg Oral BID Romana Juniper A, MD   250 mg at 01/10/19 2205  . traMADol (ULTRAM) tablet 50 mg  50 mg Oral Q6H PRN Romana Juniper A, MD   50 mg at 01/09/19 1727  . traZODone (DESYREL) tablet 50 mg  50 mg Oral QHS Romana Juniper A, MD   50 mg at 01/10/19 2205     Allergies  Allergen Reactions  . Other     BLOOD PRODUCT REFUSAL     Signed: Morton Peters, MD, FACS, MASCRS Gastrointestinal and Minimally Invasive Surgery    [address], [place] [place], [place] [id number] [phone] Main / Paging [phone] Fax   01/11/2019, 8:37 AM

## 2019-01-11 NOTE — Discharge Instructions (Signed)
SURGERY: POST OP INSTRUCTIONS °(Surgery for small bowel obstruction, colon resection, etc) ° ° °###################################################################### ° °EAT °Gradually transition to a high fiber diet with a fiber supplement over the next few days after discharge ° °WALK °Walk an hour a day.  Control your pain to do that.   ° °CONTROL PAIN °Control pain so that you can walk, sleep, tolerate sneezing/coughing, go up/down stairs. ° °HAVE A BOWEL MOVEMENT DAILY °Keep your bowels regular to avoid problems.  OK to try a laxative to override constipation.  OK to use an antidairrheal to slow down diarrhea.  Call if not better after 2 tries ° °CALL IF YOU HAVE PROBLEMS/CONCERNS °Call if you are still struggling despite following these instructions. °Call if you have concerns not answered by these instructions ° °###################################################################### ° ° °DIET °Follow a light diet the first few days at home.  Start with a bland diet such as soups, liquids, starchy foods, low fat foods, etc.  If you feel full, bloated, or constipated, stay on a ful liquid or pureed/blenderized diet for a few days until you feel better and no longer constipated. °Be sure to drink plenty of fluids every day to avoid getting dehydrated (feeling dizzy, not urinating, etc.). °Gradually add a fiber supplement to your diet over the next week.  Gradually get back to a regular solid diet.  Avoid fast food or heavy meals the first week as you are more likely to get nauseated. °It is expected for your digestive tract to need a few months to get back to normal.  It is common for your bowel movements and stools to be irregular.  You will have occasional bloating and cramping that should eventually fade away.  Until you are eating solid food normally, off all pain medications, and back to regular activities; your bowels will not be normal. °Focus on eating a low-fat, high fiber diet the rest of your life  (See Getting to Good Bowel Health, below). ° °CARE of your INCISION or WOUND °It is good for closed incision and even open wounds to be washed every day.  Shower every day.  Short baths are fine.  Wash the incisions and wounds clean with soap & water.    °If you have a closed incision(s), wash the incision with soap & water every day.  You may leave closed incisions open to air if it is dry.   You may cover the incision with clean gauze & replace it after your daily shower for comfort. °If you have skin tapes (Steristrips) or skin glue (Dermabond) on your incision, leave them in place.  They will fall off on their own like a scab.  You may trim any edges that curl up with clean scissors.  If you have staples, set up an appointment for them to be removed in the office in 10 days after surgery.  °If you have a drain, wash around the skin exit site with soap & water and place a new dressing of gauze or band aid around the skin every day.  Keep the drain site clean & dry.    °If you have an open wound with packing, see wound care instructions.  In general, it is encouraged that you remove your dressing and packing, shower with soap & water, and replace your dressing once a day.  Pack the wound with clean gauze moistened with normal (0.9%) saline to keep the wound moist & uninfected.  Pressure on the dressing for 30 minutes will stop most wound   bleeding.  Eventually your body will heal & pull the open wound closed over the next few months.  °Raw open wounds will occasionally bleed or secrete yellow drainage until it heals closed.  Drain sites will drain a little until the drain is removed.  Even closed incisions can have mild bleeding or drainage the first few days until the skin edges scab over & seal.   °If you have an open wound with a wound vac, see wound vac care instructions. ° ° ° ° °ACTIVITIES as tolerated °Start light daily activities --- self-care, walking, climbing stairs-- beginning the day after surgery.   Gradually increase activities as tolerated.  Control your pain to be active.  Stop when you are tired.  Ideally, walk several times a day, eventually an hour a day.   °Most people are back to most day-to-day activities in a few weeks.  It takes 4-8 weeks to get back to unrestricted, intense activity. °If you can walk 30 minutes without difficulty, it is safe to try more intense activity such as jogging, treadmill, bicycling, low-impact aerobics, swimming, etc. °Save the most intensive and strenuous activity for last (Usually 4-8 weeks after surgery) such as sit-ups, heavy lifting, contact sports, etc.  Refrain from any intense heavy lifting or straining until you are off narcotics for pain control.  You will have off days, but things should improve week-by-week. °DO NOT PUSH THROUGH PAIN.  Let pain be your guide: If it hurts to do something, don't do it.  Pain is your body warning you to avoid that activity for another week until the pain goes down. °You may drive when you are no longer taking narcotic prescription pain medication, you can comfortably wear a seatbelt, and you can safely make sudden turns/stops to protect yourself without hesitating due to pain. °You may have sexual intercourse when it is comfortable. If it hurts to do something, stop. ° °MEDICATIONS °Take your usually prescribed home medications unless otherwise directed.   °Blood thinners:  °Usually you can restart any strong blood thinners after the second postoperative day.  It is OK to take aspirin right away.    ° If you are on strong blood thinners (warfarin/Coumadin, Plavix, Xerelto, Eliquis, Pradaxa, etc), discuss with your surgeon, medicine PCP, and/or cardiologist for instructions on when to restart the blood thinner & if blood monitoring is needed (PT/INR blood check, etc).   ° ° °PAIN CONTROL °Pain after surgery or related to activity is often due to strain/injury to muscle, tendon, nerves and/or incisions.  This pain is usually  short-term and will improve in a few months.  °To help speed the process of healing and to get back to regular activity more quickly, DO THE FOLLOWING THINGS TOGETHER: °1. Increase activity gradually.  DO NOT PUSH THROUGH PAIN °2. Use Ice and/or Heat °3. Try Gentle Massage and/or Stretching °4. Take over the counter pain medication °5. Take Narcotic prescription pain medication for more severe pain ° °Good pain control = faster recovery.  It is better to take more medicine to be more active than to stay in bed all day to avoid medications. °1.  Increase activity gradually °Avoid heavy lifting at first, then increase to lifting as tolerated over the next 6 weeks. °Do not “push through” the pain.  Listen to your body and avoid positions and maneuvers than reproduce the pain.  Wait a few days before trying something more intense °Walking an hour a day is encouraged to help your body recover faster   and more safely.  Start slowly and stop when getting sore.  If you can walk 30 minutes without stopping or pain, you can try more intense activity (running, jogging, aerobics, cycling, swimming, treadmill, sex, sports, weightlifting, etc.) °Remember: If it hurts to do it, then don’t do it! °2. Use Ice and/or Heat °You will have swelling and bruising around the incisions.  This will take several weeks to resolve. °Ice packs or heating pads (6-8 times a day, 30-60 minutes at a time) will help sooth soreness & bruising. °Some people prefer to use ice alone, heat alone, or alternate between ice & heat.  Experiment and see what works best for you.  Consider trying ice for the first few days to help decrease swelling and bruising; then, switch to heat to help relax sore spots and speed recovery. °Shower every day.  Short baths are fine.  It feels good!  Keep the incisions and wounds clean with soap & water.   °3. Try Gentle Massage and/or Stretching °Massage at the area of pain many times a day °Stop if you feel pain - do not  overdo it °4. Take over the counter pain medication °This helps the muscle and nerve tissues become less irritable and calm down faster °Choose ONE of the following over-the-counter anti-inflammatory medications: °Acetaminophen 500mg tabs (Tylenol) 1-2 pills with every meal and just before bedtime (avoid if you have liver problems or if you have acetaminophen in you narcotic prescription) °Naproxen 220mg tabs (ex. Aleve, Naprosyn) 1-2 pills twice a day (avoid if you have kidney, stomach, IBD, or bleeding problems) °Ibuprofen 200mg tabs (ex. Advil, Motrin) 3-4 pills with every meal and just before bedtime (avoid if you have kidney, stomach, IBD, or bleeding problems) °Take with food/snack several times a day as directed for at least 2 weeks to help keep pain / soreness down & more manageable. °5. Take Narcotic prescription pain medication for more severe pain °A prescription for strong pain control is often given to you upon discharge (for example: oxycodone/Percocet, hydrocodone/Norco/Vicodin, or tramadol/Ultram) °Take your pain medication as prescribed. °Be mindful that most narcotic prescriptions contain Tylenol (acetaminophen) as well - avoid taking too much Tylenol. °If you are having problems/concerns with the prescription medicine (does not control pain, nausea, vomiting, rash, itching, etc.), please call us [phone] to see if we need to switch you to a different pain medicine that will work better for you and/or control your side effects better. °If you need a refill on your pain medication, you must call the office before 4 pm and on weekdays only.  By federal law, prescriptions for narcotics cannot be called into a pharmacy.  They must be filled out on paper & picked up from our office by the patient or authorized caretaker.  Prescriptions cannot be filled after 4 pm nor on weekends.   ° °WHEN TO CALL US [phone] °Severe uncontrolled or worsening pain  °Fever over 101 F (38.5 C) °Concerns with  the incision: Worsening pain, redness, rash/hives, swelling, bleeding, or drainage °Reactions / problems with new medications (itching, rash, hives, nausea, etc.) °Nausea and/or vomiting °Difficulty urinating °Difficulty breathing °Worsening fatigue, dizziness, lightheadedness, blurred vision °Other concerns °If you are not getting better after two weeks or are noticing you are getting worse, contact our office [phone] for further advice.  We may need to adjust your medications, re-evaluate you in the office, send you to the emergency room, or see what other things we can do to help. °The   clinic staff is available to answer your questions during regular business hours (8:30am-5pm).  Please don’t hesitate to call and ask to speak to one of our nurses for clinical concerns.    °A surgeon from [facility] is always on call at the hospitals 24 hours/day °If you have a medical emergency, go to the nearest emergency room or call 911. ° °FOLLOW UP in our office °One the day of your discharge from the hospital (or the next business weekday), please call [facility] to set up or confirm an appointment to see your surgeon in the office for a follow-up appointment.  Usually it is 2-3 weeks after your surgery.   °If you have skin staples at your incision(s), let the office know so we can set up a time in the office for the nurse to remove them (usually around 10 days after surgery). °Make sure that you call for appointments the day of discharge (or the next business weekday) from the hospital to ensure a convenient appointment time. °IF YOU HAVE DISABILITY OR FAMILY LEAVE FORMS, BRING THEM TO THE OFFICE FOR PROCESSING.  DO NOT GIVE THEM TO YOUR DOCTOR. ° °[facility] °[address], [address], [place], [place]  [address] ? °[phone] - Main °[phone] - Toll Free,  [phone] - Fax °[email or web address] ° °GETTING TO GOOD BOWEL HEALTH. °It is  expected for your digestive tract to need a few months to get back to normal.  It is common for your bowel movements and stools to be irregular.  You will have occasional bloating and cramping that should eventually fade away.  Until you are eating solid food normally, off all pain medications, and back to regular activities; your bowels will not be normal.   °Avoiding constipation °The goal: ONE SOFT BOWEL MOVEMENT A DAY!    °Drink plenty of fluids.  Choose water first. °TAKE A FIBER SUPPLEMENT EVERY DAY THE REST OF YOUR LIFE °During your first week back home, gradually add back a fiber supplement every day °Experiment which form you can tolerate.   There are many forms such as powders, tablets, wafers, gummies, etc °Psyllium bran (Metamucil), methylcellulose (Citrucel), Miralax or Glycolax, Benefiber, Flax Seed.  °Adjust the dose week-by-week (1/2 dose/day to 6 doses a day) until you are moving your bowels 1-2 times a day.  Cut back the dose or try a different fiber product if it is giving you problems such as diarrhea or bloating. °Sometimes a laxative is needed to help jump-start bowels if constipated until the fiber supplement can help regulate your bowels.  If you are tolerating eating & you are farting, it is okay to try a gentle laxative such as double dose MiraLax, prune juice, or Milk of Magnesia.  Avoid using laxatives too often. °Stool softeners can sometimes help counteract the constipating effects of narcotic pain medicines.  It can also cause diarrhea, so avoid using for too long. °If you are still constipated despite taking fiber daily, eating solids, and a few doses of laxatives, call our office. °Controlling diarrhea °Try drinking liquids and eating bland foods for a few days to avoid stressing your intestines further. °Avoid dairy products (especially milk & ice cream) for a short time.  The intestines often can lose the ability to digest lactose when stressed. °Avoid foods that cause gassiness or  bloating.  Typical foods include beans and other legumes, cabbage, broccoli, and dairy foods.  Avoid greasy, spicy, fast foods.  Every person has   some sensitivity to other foods, so listen to your body and avoid those foods that trigger problems for you. °Probiotics (such as active yogurt, Align, etc) may help repopulate the intestines and colon with normal bacteria and calm down a sensitive digestive tract °Adding a fiber supplement gradually can help thicken stools by absorbing excess fluid and retrain the intestines to act more normally.  Slowly increase the dose over a few weeks.  Too much fiber too soon can backfire and cause cramping & bloating. °It is okay to try and slow down diarrhea with a few doses of antidiarrheal medicines.   °Bismuth subsalicylate (ex. Kayopectate, Pepto Bismol) for a few doses can help control diarrhea.  Avoid if pregnant.   °Loperamide (Imodium) can slow down diarrhea.  Start with one tablet (2mg) first.  Avoid if you are having fevers or severe pain.  °ILEOSTOMY PATIENTS WILL HAVE CHRONIC DIARRHEA since their colon is not in use.    °Drink plenty of liquids.  You will need to drink even more glasses of water/liquid a day to avoid getting dehydrated. °Record output from your ileostomy.  Expect to empty the bag every 3-4 hours at first.  Most people with a permanent ileostomy empty their bag 4-6 times at the least.   °Use antidiarrheal medicine (especially Imodium) several times a day to avoid getting dehydrated.  Start with a dose at bedtime & breakfast.  Adjust up or down as needed.  Increase antidiarrheal medications as directed to avoid emptying the bag more than 8 times a day (every 3 hours). °Work with your wound ostomy nurse to learn care for your ostomy.  See ostomy care instructions. °TROUBLESHOOTING IRREGULAR BOWELS °1) Start with a soft & bland diet. No spicy, greasy, or fried foods.  °2) Avoid gluten/wheat or dairy products from diet to see if symptoms improve. °3) Miralax  17gm or flax seed mixed in 8oz. water or juice-daily. May use 2-4 times a day as needed. °4) Gas-X, Phazyme, etc. as needed for gas & bloating.  °5) Prilosec (omeprazole) over-the-counter as needed °6)  Consider probiotics (Align, Activa, etc) to help calm the bowels down ° °Call your doctor if you are getting worse or not getting better.  Sometimes further testing (cultures, endoscopy, X-ray studies, CT scans, bloodwork, etc.) may be needed to help diagnose and treat the cause of the diarrhea. °[facility] °[address], [address], [place], [place]  [address] °[phone] - Main.    °[phone]  - Toll Free.   [phone] - Fax °[email or web address] ° ° ° °Diverticulitis ° °Diverticulitis is infection or inflammation of small pouches (diverticula) in the colon that form due to a condition called diverticulosis. Diverticula can trap stool (feces) and bacteria, causing infection and inflammation. °Diverticulitis may cause severe stomach pain and diarrhea. It may lead to tissue damage in the colon that causes bleeding. The diverticula may also burst (rupture) and cause infected stool to enter other areas of the abdomen. °Complications of diverticulitis can include: °· Bleeding. °· Severe infection. °· Severe pain. °· Rupture (perforation) of the colon. °· Blockage (obstruction) of the colon. °What are the causes? °This condition is caused by stool becoming trapped in the diverticula, which allows bacteria to grow in the diverticula. This leads to inflammation and infection. °What increases the risk? °You are more likely to develop this condition if: °· You have diverticulosis. The risk for diverticulosis increases if: °? You are overweight or obese. °? You use tobacco products. °? You   do not get enough exercise. °· You eat a diet that does not include enough fiber. High-fiber foods include fruits, vegetables, beans, nuts, and whole grains. °What are the signs or  symptoms? °Symptoms of this condition may include: °· Pain and tenderness in the abdomen. The pain is normally located on the left side of the abdomen, but it may occur in other areas. °· Fever and chills. °· Bloating. °· Cramping. °· Nausea. °· Vomiting. °· Changes in bowel routines. °· Blood in your stool. °How is this diagnosed? °This condition is diagnosed based on: °· Your medical history. °· A physical exam. °· Tests to make sure there is nothing else causing your condition. These tests may include: °? Blood tests. °? Urine tests. °? Imaging tests of the abdomen, including X-rays, ultrasounds, MRIs, or CT scans. °How is this treated? °Most cases of this condition are mild and can be treated at home. Treatment may include: °· Taking over-the-counter pain medicines. °· Following a clear liquid diet. °· Taking antibiotic medicines by mouth. °· Rest. °More severe cases may need to be treated at a hospital. Treatment may include: °· Not eating or drinking. °· Taking prescription pain medicine. °· Receiving antibiotic medicines through an IV tube. °· Receiving fluids and nutrition through an IV tube. °· Surgery. °When your condition is under control, your health care provider may recommend that you have a colonoscopy. This is an exam to look at the entire large intestine. During the exam, a lubricated, bendable tube is inserted into the anus and then passed into the rectum, colon, and other parts of the large intestine. A colonoscopy can show how severe your diverticula are and whether something else may be causing your symptoms. °Follow these instructions at home: °Medicines °· Take over-the-counter and prescription medicines only as told by your health care provider. These include fiber supplements, probiotics, and stool softeners. °· If you were prescribed an antibiotic medicine, take it as told by your health care provider. Do not stop taking the antibiotic even if you start to feel better. °· Do not drive or  use heavy machinery while taking prescription pain medicine. °General instructions ° °· Follow a full liquid diet or another diet as directed by your health care provider. After your symptoms improve, your health care provider may tell you to change your diet. He or she may recommend that you eat a diet that contains at least 25 g (25 grams) of fiber daily. Fiber makes it easier to pass stool. Healthy sources of fiber include: °? Berries. One cup contains 4-8 grams of fiber. °? Beans or lentils. One half cup contains 5-8 grams of fiber. °? Green vegetables. One cup contains 4 grams of fiber. °· Exercise for at least 30 minutes, 3 times each week. You should exercise hard enough to raise your heart rate and break a sweat. °· Keep all follow-up visits as told by your health care provider. This is important. You may need a colonoscopy. °Contact a health care provider if: °· Your pain does not improve. °· You have a hard time drinking or eating food. °· Your bowel movements do not return to normal. °Get help right away if: °· Your pain gets worse. °· Your symptoms do not get better with treatment. °· Your symptoms suddenly get worse. °· You have a fever. °· You vomit more than one time. °· You have stools that are bloody, black, or tarry. °Summary °· Diverticulitis is infection or inflammation of small pouches (diverticula) in the   colon that form due to a condition called diverticulosis. Diverticula can trap stool (feces) and bacteria, causing infection and inflammation. °· You are at higher risk for this condition if you have diverticulosis and you eat a diet that does not include enough fiber. °· Most cases of this condition are mild and can be treated at home. More severe cases may need to be treated at a hospital. °· When your condition is under control, your health care provider may recommend that you have an exam called a colonoscopy. This exam can show how severe your diverticula are and whether something else  may be causing your symptoms. °This information is not intended to replace advice given to you by your health care provider. Make sure you discuss any questions you have with your health care provider. °Document Released: 09/06/2005 Document Revised: 12/30/2016 Document Reviewed: 12/30/2016 °Elsevier Interactive Patient Education © 2019 [employer] ° °

## 2019-01-11 NOTE — Plan of Care (Signed)
Patient lying in bed this morning. No complaints of pain voiced at this time. Patient anxious to get IV removed and JP drain out in order to go home later today. Will continue to monitor.

## 2019-01-12 ENCOUNTER — Encounter (HOSPITAL_COMMUNITY): Payer: Self-pay

## 2019-01-12 ENCOUNTER — Inpatient Hospital Stay (HOSPITAL_COMMUNITY)
Admission: EM | Admit: 2019-01-12 | Discharge: 2019-01-16 | DRG: 389 | Disposition: A | Discharge status: Home / Self Care | Payer: Medicaid Other | Attending: Surgery | Admitting: Surgery

## 2019-01-12 ENCOUNTER — Emergency Department (HOSPITAL_COMMUNITY): Payer: Medicaid Other

## 2019-01-12 ENCOUNTER — Other Ambulatory Visit: Payer: Self-pay

## 2019-01-12 DIAGNOSIS — Z8249 Family history of ischemic heart disease and other diseases of the circulatory system: Secondary | ICD-10-CM

## 2019-01-12 DIAGNOSIS — K567 Ileus, unspecified: Principal | ICD-10-CM | POA: Diagnosis present

## 2019-01-12 DIAGNOSIS — I10 Essential (primary) hypertension: Secondary | ICD-10-CM | POA: Diagnosis not present

## 2019-01-12 DIAGNOSIS — R079 Chest pain, unspecified: Secondary | ICD-10-CM | POA: Diagnosis not present

## 2019-01-12 DIAGNOSIS — Z7982 Long term (current) use of aspirin: Secondary | ICD-10-CM

## 2019-01-12 DIAGNOSIS — E7800 Pure hypercholesterolemia, unspecified: Secondary | ICD-10-CM | POA: Diagnosis present

## 2019-01-12 DIAGNOSIS — R1084 Generalized abdominal pain: Secondary | ICD-10-CM | POA: Diagnosis not present

## 2019-01-12 DIAGNOSIS — Z9049 Acquired absence of other specified parts of digestive tract: Secondary | ICD-10-CM

## 2019-01-12 DIAGNOSIS — R0789 Other chest pain: Secondary | ICD-10-CM | POA: Diagnosis not present

## 2019-01-12 DIAGNOSIS — Z9889 Other specified postprocedural states: Secondary | ICD-10-CM | POA: Diagnosis not present

## 2019-01-12 DIAGNOSIS — R402362 Coma scale, best motor response, obeys commands, at arrival to emergency department: Secondary | ICD-10-CM | POA: Diagnosis present

## 2019-01-12 DIAGNOSIS — K5900 Constipation, unspecified: Secondary | ICD-10-CM | POA: Diagnosis present

## 2019-01-12 DIAGNOSIS — M25511 Pain in right shoulder: Secondary | ICD-10-CM | POA: Diagnosis not present

## 2019-01-12 DIAGNOSIS — E876 Hypokalemia: Secondary | ICD-10-CM | POA: Diagnosis present

## 2019-01-12 DIAGNOSIS — N390 Urinary tract infection, site not specified: Secondary | ICD-10-CM | POA: Diagnosis present

## 2019-01-12 DIAGNOSIS — R109 Unspecified abdominal pain: Secondary | ICD-10-CM | POA: Diagnosis not present

## 2019-01-12 DIAGNOSIS — Z87891 Personal history of nicotine dependence: Secondary | ICD-10-CM

## 2019-01-12 DIAGNOSIS — Z531 Procedure and treatment not carried out because of patient's decision for reasons of belief and group pressure: Secondary | ICD-10-CM | POA: Diagnosis present

## 2019-01-12 DIAGNOSIS — R402252 Coma scale, best verbal response, oriented, at arrival to emergency department: Secondary | ICD-10-CM | POA: Diagnosis present

## 2019-01-12 DIAGNOSIS — M1990 Unspecified osteoarthritis, unspecified site: Secondary | ICD-10-CM | POA: Diagnosis present

## 2019-01-12 DIAGNOSIS — B952 Enterococcus as the cause of diseases classified elsewhere: Secondary | ICD-10-CM | POA: Diagnosis present

## 2019-01-12 DIAGNOSIS — K9189 Other postprocedural complications and disorders of digestive system: Secondary | ICD-10-CM

## 2019-01-12 DIAGNOSIS — G8918 Other acute postprocedural pain: Secondary | ICD-10-CM | POA: Diagnosis present

## 2019-01-12 DIAGNOSIS — D62 Acute posthemorrhagic anemia: Secondary | ICD-10-CM | POA: Diagnosis present

## 2019-01-12 DIAGNOSIS — Z833 Family history of diabetes mellitus: Secondary | ICD-10-CM

## 2019-01-12 DIAGNOSIS — G4720 Circadian rhythm sleep disorder, unspecified type: Secondary | ICD-10-CM | POA: Diagnosis present

## 2019-01-12 DIAGNOSIS — R402142 Coma scale, eyes open, spontaneous, at arrival to emergency department: Secondary | ICD-10-CM | POA: Diagnosis present

## 2019-01-12 LAB — COMPREHENSIVE METABOLIC PANEL
ALT: 49 U/L — ABNORMAL HIGH (ref 0–44)
AST: 59 U/L — ABNORMAL HIGH (ref 15–41)
Albumin: 3.1 g/dL — ABNORMAL LOW (ref 3.5–5.0)
Alkaline Phosphatase: 56 U/L (ref 38–126)
Anion gap: 8 (ref 5–15)
BUN: 15 mg/dL (ref 6–20)
CO2: 26 mmol/L (ref 22–32)
Calcium: 8.5 mg/dL — ABNORMAL LOW (ref 8.9–10.3)
Chloride: 103 mmol/L (ref 98–111)
Creatinine, Ser: 0.51 mg/dL (ref 0.44–1.00)
GFR calc Af Amer: >60 mL/min (ref >60)
Glucose, Bld: 137 mg/dL — ABNORMAL HIGH (ref 70–99)
Potassium: 3.7 mmol/L (ref 3.5–5.1)
Sodium: 137 mmol/L (ref 135–145)
Total Bilirubin: 0.5 mg/dL (ref 0.3–1.2)
Total Protein: 6.6 g/dL (ref 6.5–8.1)

## 2019-01-12 LAB — CBC WITH DIFFERENTIAL/PLATELET
Abs Immature Granulocytes: 0.14 10*3/uL — ABNORMAL HIGH (ref 0.00–0.07)
Basophils Absolute: 0 10*3/uL (ref 0.0–0.1)
Basophils Relative: 0 %
EOS PCT: 2 %
Eosinophils Absolute: 0.2 10*3/uL (ref 0.0–0.5)
HCT: 27.1 % — ABNORMAL LOW (ref 36.0–46.0)
Hemoglobin: 8.2 g/dL — ABNORMAL LOW (ref 12.0–15.0)
Immature Granulocytes: 1 %
Lymphocytes Relative: 9 %
Lymphs Abs: 1 10*3/uL (ref 0.7–4.0)
MCH: 27.2 pg (ref 26.0–34.0)
MCHC: 30.3 g/dL (ref 30.0–36.0)
MCV: 89.7 fL (ref 80.0–100.0)
Monocytes Absolute: 0.5 10*3/uL (ref 0.1–1.0)
Monocytes Relative: 5 %
Neutro Abs: 8.5 10*3/uL — ABNORMAL HIGH (ref 1.7–7.7)
Neutrophils Relative %: 83 %
Platelets: 414 10*3/uL — ABNORMAL HIGH (ref 150–400)
RBC: 3.02 MIL/uL — ABNORMAL LOW (ref 3.87–5.11)
RDW: 17.2 % — ABNORMAL HIGH (ref 11.5–15.5)
WBC: 10.3 10*3/uL (ref 4.0–10.5)
nRBC: 0 % (ref 0.0–0.2)

## 2019-01-12 LAB — URINALYSIS, ROUTINE W REFLEX MICROSCOPIC
Bilirubin Urine: NEGATIVE
Glucose, UA: NEGATIVE mg/dL
HGB URINE DIPSTICK: NEGATIVE
Ketones, ur: 80 mg/dL — AB
Leukocytes, UA: NEGATIVE
Nitrite: NEGATIVE
Protein, ur: NEGATIVE mg/dL
SPECIFIC GRAVITY, URINE: 1.018 (ref 1.005–1.030)
pH: 6 (ref 5.0–8.0)

## 2019-01-12 LAB — LACTIC ACID, PLASMA: Lactic Acid, Venous: 0.7 mmol/L (ref 0.5–1.9)

## 2019-01-12 LAB — I-STAT TROPONIN, ED: Troponin i, poc: 0 ng/mL (ref 0.00–0.08)

## 2019-01-12 LAB — LIPASE, BLOOD: Lipase: 29 U/L (ref 11–51)

## 2019-01-12 MED ORDER — IOPAMIDOL (ISOVUE-300) INJECTION 61%
INTRAVENOUS | Status: AC
  Filled 2019-01-12: qty 100

## 2019-01-12 MED ORDER — SODIUM CHLORIDE 0.9 % IV SOLN
INTRAVENOUS | Status: DC
  Administered 2019-01-12: 22:00:00 via INTRAVENOUS

## 2019-01-12 MED ORDER — SODIUM CHLORIDE 0.9 % IV BOLUS
1000.0000 mL | Freq: Once | INTRAVENOUS | Status: AC
  Administered 2019-01-12: 1000 mL via INTRAVENOUS

## 2019-01-12 MED ORDER — SODIUM CHLORIDE (PF) 0.9 % IJ SOLN
INTRAMUSCULAR | Status: AC
  Filled 2019-01-12: qty 50

## 2019-01-12 MED ORDER — IOPAMIDOL (ISOVUE-300) INJECTION 61%
100.0000 mL | Freq: Once | INTRAVENOUS | Status: AC | PRN
  Administered 2019-01-12: 100 mL via INTRAVENOUS

## 2019-01-12 NOTE — ED Triage Notes (Signed)
Pt received 4mg  zofran, 200 mcg of fentanyl, and 532ml NS

## 2019-01-12 NOTE — ED Notes (Signed)
Bed: WA07 Expected date:  Expected time:  Means of arrival:  Comments: EMS 58 yo female discharged yesterday after abdominal surgery-did not get Rx/s filled-having chest pain radiating to shoulder

## 2019-01-12 NOTE — ED Triage Notes (Signed)
Per EMS, Pt is having chest pain radiating from chest to right shoulder. Pt recently discharged following a surgery. Pt was unable to fill medications prescribed from her last admission.

## 2019-01-12 NOTE — ED Provider Notes (Signed)
[facility] DEPT Provider Note   CSN: [id number] Arrival date & time: 01/12/19  2034     History   Chief Complaint Chief Complaint  Patient presents with  . Chest Pain  . Abdominal Pain    HPI Danielle Villa is a 58 y.o. female.  58 year old female who was recently discharged from the hospital after having a partial colectomy due to severe diverticular disease who presents with abdominal discomfort radiating up to her chest which is been persistent all day.  He has had nausea but no vomiting.  She said subjective chills.  Endorses watery diarrhea.  She endorses temperature at home of 100.4.  Abdominal discomfort is characterizes sharp and aching.  Denies any urinary symptoms.  EMS called and patient given IV fluids as well as IV pain medication and transported here     Past Medical History:  Diagnosis Date  . Anxiety   . Arthritis   . Back pain   . Depression   . Headache     Patient Active Problem List   Diagnosis Date Noted  . Refusal of blood transfusions as patient is Jehovah's Witness 01/11/2019  . Diverticulitis 01/06/2019  . Diverticulitis of colon s/p colectomy 01/06/2019 11/26/2018  . Headache 11/26/2018  . Abdominal pain, epigastric 08/20/2018  . Pain of both hip joints 08/06/2018  . TMJ (temporomandibular joint disorder) 08/03/2018  . Ringworm 05/17/2018  . OA (osteoarthritis) of hip 03/15/2018  . Sleep-wake cycle disorder 03/15/2018  . Trochanteric bursitis of both hips 12/19/2017  . Abdominal pain, left lower quadrant 11/28/2017  . Encounter for disability assessment 11/13/2017  . Chronic hand pain, right 10/16/2017  . Hypertrophic lichen planus 84/13/2440  . Mood disturbance 09/07/2017  . Left knee pain 07/04/2017  . Constipation 05/17/2017  . POLYURIA 12/29/2009  . ONYCHOMYCOSIS, TOENAILS 02/01/2009  . FIBROIDS, UTERUS 04/29/2008  . MENORRHAGIA 03/06/2008  . HYPERCHOLESTEROLEMIA 02/07/2007  . PANIC ATTACKS  02/07/2007  . BACK PAIN, LOW 02/07/2007  . Incontinence of feces 02/07/2007    Past Surgical History:  Procedure Laterality Date  . APPLICATION OF WOUND VAC  01/06/2019   Procedure: APPLICATION OF WOUND VAC;  Surgeon: Clovis Riley, MD;  Location: [facility];  Service: General;;  . cyst removed from ovary     . CYSTOSCOPY WITH STENT PLACEMENT Bilateral 01/06/2019   Procedure: CYSTOSCOPY WITH BILATERAL STENT PLACEMENT;  Surgeon: Raynelle Bring, MD;  Location: [facility];  Service: Urology;  Laterality: Bilateral;  . LAPAROSCOPIC SIGMOID COLECTOMY N/A 01/06/2019   Procedure: LAPAROSCOPIC EXTENDED LEFT HEMI-  COLECTOMY;  Surgeon: Clovis Riley, MD;  Location: [facility];  Service: General;  Laterality: N/A;  . OOPHORECTOMY  01/06/2019   Procedure: LEFT OOPHORECTOMY;  Surgeon: Clovis Riley, MD;  Location: [facility];  Service: General;;  . PROCTOSCOPY  01/06/2019   Procedure: PROCTOSCOPY;  Surgeon: Clovis Riley, MD;  Location: [facility];  Service: General;;  . TONSILLECTOMY       OB History    Gravida  5   Para  4   Term  4   Preterm      AB      Living  4     SAB      TAB      Ectopic      Multiple      Live Births  4            Home Medications    Prior to Admission medications   Medication Sig  Start Date End Date Taking? Authorizing Provider  acetaminophen (TYLENOL) 325 MG tablet Take 2 tablets (650 mg total) by mouth every 6 (six) hours as needed (Take tylenol and ibuprofen around the clock for the first 1-2 days after you get home, then take as needed.). 01/10/19   Clovis Riley, MD  aspirin (ASPIRIN EC) 81 MG EC tablet Take 1 tablet (81 mg total) by mouth daily. Patient taking differently: Take 81 mg by mouth at bedtime.  09/17/18   Sela Hilding, MD  atorvastatin (LIPITOR) 20 MG tablet Take 1 tablet (20 mg total) by mouth every evening. 09/17/18   Sela Hilding, MD  ferrous sulfate 325 (65 FE) MG tablet Take 1 tablet (325 mg total) by mouth 3  (three) times daily with meals. 01/10/19   Clovis Riley, MD  gabapentin (NEURONTIN) 300 MG capsule Take 1 capsule (300 mg total) by mouth 2 (two) times daily. 01/10/19   Clovis Riley, MD  ibuprofen (ADVIL,MOTRIN) 800 MG tablet Take 0.5 tablets (400 mg total) by mouth every 6 (six) hours as needed for mild pain or moderate pain (Take tylenol and ibuprofen aroudn the clock for the first 1-2 days after you get home. Then take as needed.). 01/10/19   Clovis Riley, MD  Multiple Vitamin (MULTIVITAMIN WITH MINERALS) TABS tablet Take 1 tablet by mouth daily. 01/11/19   Clovis Riley, MD  oxyCODONE (OXY IR/ROXICODONE) 5 MG immediate release tablet Take 1 tablet (5 mg total) by mouth every 6 (six) hours as needed for severe pain (For pain not controlled by other medications.). 01/10/19   Clovis Riley, MD  pantoprazole (PROTONIX) 20 MG tablet Take 1 tablet (20 mg total) by mouth 2 (two) times daily. 08/04/18   Lacretia Leigh, MD  PARoxetine (PAXIL) 20 MG tablet Take 1 tablet (20 mg total) by mouth daily. 10/02/18   Arfeen, Arlyce Harman, MD  traMADol (ULTRAM) 50 MG tablet Take 1 tablet (50 mg total) by mouth every 6 (six) hours as needed. 01/01/19   Sela Hilding, MD  traZODone (DESYREL) 50 MG tablet Take 1 tablet (50 mg total) by mouth at bedtime as needed. for sleep Patient taking differently: Take 50 mg by mouth at bedtime. for sleep 10/02/18   Arfeen, Arlyce Harman, MD    Family History Family History  Problem Relation Age of Onset  . Hypertension Mother   . Diabetes Mother   . Hypertension Maternal Grandmother     Social History Social History   Tobacco Use  . Smoking status: Former Research scientist (life sciences)  . Smokeless tobacco: Never Used  Substance Use Topics  . Alcohol use: Yes    Comment: occasionaly  . Drug use: No     Allergies   Other   Review of Systems Review of Systems  All other systems reviewed and are negative.    Physical Exam Updated Vital Signs BP 103/69   Pulse 90    Resp 17   SpO2 95%   Physical Exam Vitals signs and nursing note reviewed.  Constitutional:      General: She is not in acute distress.    Appearance: Normal appearance. She is well-developed. She is not toxic-appearing.  HENT:     Head: Normocephalic and atraumatic.  Eyes:     General: Lids are normal.     Conjunctiva/sclera: Conjunctivae normal.     Pupils: Pupils are equal, round, and reactive to light.  Neck:     Musculoskeletal: Normal range of motion and neck supple.  Thyroid: No thyroid mass.     Trachea: No tracheal deviation.  Cardiovascular:     Rate and Rhythm: Normal rate and regular rhythm.     Heart sounds: Normal heart sounds. No murmur. No gallop.   Pulmonary:     Effort: Pulmonary effort is normal. No respiratory distress.     Breath sounds: Normal breath sounds. No stridor. No decreased breath sounds, wheezing, rhonchi or rales.  Abdominal:     General: A surgical scar is present. Bowel sounds are normal. There is no distension.     Palpations: Abdomen is soft.     Tenderness: There is abdominal tenderness in the epigastric area and periumbilical area. There is guarding. There is no rebound.    Musculoskeletal: Normal range of motion.        General: No tenderness.  Skin:    General: Skin is warm and dry.     Findings: No abrasion or rash.  Neurological:     Mental Status: She is alert and oriented to person, place, and time.     GCS: GCS eye subscore is 4. GCS verbal subscore is 5. GCS motor subscore is 6.     Cranial Nerves: No cranial nerve deficit.     Sensory: No sensory deficit.  Psychiatric:        Speech: Speech normal.        Behavior: Behavior normal.      ED Treatments / Results  Labs (all labs ordered are listed, but only abnormal results are displayed) Labs Reviewed  CULTURE, BLOOD (ROUTINE X 2)  CULTURE, BLOOD (ROUTINE X 2)  URINE CULTURE  CBC WITH DIFFERENTIAL/PLATELET  COMPREHENSIVE METABOLIC PANEL  LIPASE, BLOOD  LACTIC  ACID, PLASMA  URINALYSIS, ROUTINE W REFLEX MICROSCOPIC  I-STAT TROPONIN, ED    EKG None  Radiology No results found.  Procedures Procedures (including critical care time)  Medications Ordered in ED Medications  sodium chloride 0.9 % bolus 1,000 mL (has no administration in time range)  0.9 %  sodium chloride infusion (has no administration in time range)     Initial Impression / Assessment and Plan / ED Course  I have reviewed the triage vital signs and the nursing notes.  Pertinent labs & imaging results that were available during my care of the patient were reviewed by me and considered in my medical decision making (see chart for details).     Patient given IV fluids for her dehydration.  CBC shows no leukocytosis and stable hemoglobin.  Abdominal CT concerning for possible large bowel production.  Case discussed with Dr. Marlou Starks from general surgery who will come and see the patient  Final Clinical Impressions(s) / ED Diagnoses   Final diagnoses:  None    ED Discharge Orders    None       Lacretia Leigh, MD 01/12/19 2340

## 2019-01-12 NOTE — ED Notes (Signed)
Patient transported to CT 

## 2019-01-13 ENCOUNTER — Encounter (HOSPITAL_COMMUNITY): Payer: Self-pay

## 2019-01-13 DIAGNOSIS — Z9049 Acquired absence of other specified parts of digestive tract: Secondary | ICD-10-CM

## 2019-01-13 LAB — C DIFFICILE QUICK SCREEN W PCR REFLEX
C DIFFICILE (CDIFF) TOXIN: NEGATIVE
C Diff antigen: NEGATIVE
C Diff interpretation: NOT DETECTED

## 2019-01-13 LAB — BASIC METABOLIC PANEL
Anion gap: 5 (ref 5–15)
BUN: 11 mg/dL (ref 6–20)
CO2: 26 mmol/L (ref 22–32)
Calcium: 8 mg/dL — ABNORMAL LOW (ref 8.9–10.3)
Chloride: 109 mmol/L (ref 98–111)
Creatinine, Ser: 0.49 mg/dL (ref 0.44–1.00)
GFR calc non Af Amer: >60 mL/min (ref >60)
Glucose, Bld: 110 mg/dL — ABNORMAL HIGH (ref 70–99)
Potassium: 3.4 mmol/L — ABNORMAL LOW (ref 3.5–5.1)
Sodium: 140 mmol/L (ref 135–145)

## 2019-01-13 LAB — MAGNESIUM: Magnesium: 2.1 mg/dL (ref 1.7–2.4)

## 2019-01-13 LAB — CBC
HCT: 24.2 % — ABNORMAL LOW (ref 36.0–46.0)
Hemoglobin: 7.3 g/dL — ABNORMAL LOW (ref 12.0–15.0)
MCH: 27.3 pg (ref 26.0–34.0)
MCHC: 30.2 g/dL (ref 30.0–36.0)
MCV: 90.6 fL (ref 80.0–100.0)
Platelets: 384 10*3/uL (ref 150–400)
RBC: 2.67 MIL/uL — ABNORMAL LOW (ref 3.87–5.11)
RDW: 17.2 % — ABNORMAL HIGH (ref 11.5–15.5)
WBC: 8.8 10*3/uL (ref 4.0–10.5)
nRBC: 0 % (ref 0.0–0.2)

## 2019-01-13 LAB — HIV ANTIBODY (ROUTINE TESTING W REFLEX): HIV Screen 4th Generation wRfx: NONREACTIVE

## 2019-01-13 MED ORDER — POTASSIUM CHLORIDE 10 MEQ/100ML IV SOLN
10.0000 meq | INTRAVENOUS | Status: DC
  Administered 2019-01-13 (×3): 10 meq via INTRAVENOUS
  Filled 2019-01-13 (×3): qty 100

## 2019-01-13 MED ORDER — TRAZODONE HCL 50 MG PO TABS
50.0000 mg | ORAL_TABLET | Freq: Every evening | ORAL | Status: DC | PRN
  Administered 2019-01-13 – 2019-01-14 (×3): 50 mg via ORAL
  Filled 2019-01-13 (×3): qty 1

## 2019-01-13 MED ORDER — HEPARIN SODIUM (PORCINE) 5000 UNIT/ML IJ SOLN
5000.0000 [IU] | Freq: Three times a day (TID) | INTRAMUSCULAR | Status: DC
  Administered 2019-01-14 – 2019-01-16 (×7): 5000 [IU] via SUBCUTANEOUS
  Filled 2019-01-13 (×7): qty 1

## 2019-01-13 MED ORDER — ADULT MULTIVITAMIN W/MINERALS CH
1.0000 | ORAL_TABLET | Freq: Every day | ORAL | Status: DC
  Administered 2019-01-13 – 2019-01-16 (×4): 1 via ORAL
  Filled 2019-01-13 (×4): qty 1

## 2019-01-13 MED ORDER — DOCUSATE SODIUM 100 MG PO CAPS
100.0000 mg | ORAL_CAPSULE | Freq: Two times a day (BID) | ORAL | Status: DC
  Administered 2019-01-13 – 2019-01-14 (×3): 100 mg via ORAL
  Filled 2019-01-13 (×3): qty 1

## 2019-01-13 MED ORDER — BOOST / RESOURCE BREEZE PO LIQD CUSTOM
1.0000 | Freq: Three times a day (TID) | ORAL | Status: DC
  Administered 2019-01-13 (×2): 1 via ORAL
  Administered 2019-01-14: 10:00:00 via ORAL
  Administered 2019-01-14 – 2019-01-16 (×7): 1 via ORAL

## 2019-01-13 MED ORDER — TRAMADOL HCL 50 MG PO TABS
50.0000 mg | ORAL_TABLET | Freq: Four times a day (QID) | ORAL | Status: DC | PRN
  Administered 2019-01-13: 50 mg via ORAL
  Filled 2019-01-13: qty 1

## 2019-01-13 MED ORDER — ATORVASTATIN CALCIUM 10 MG PO TABS
20.0000 mg | ORAL_TABLET | Freq: Every evening | ORAL | Status: DC
  Administered 2019-01-13 – 2019-01-14 (×2): 20 mg via ORAL
  Filled 2019-01-13 (×2): qty 2

## 2019-01-13 MED ORDER — KCL IN DEXTROSE-NACL 20-5-0.9 MEQ/L-%-% IV SOLN
INTRAVENOUS | Status: DC
  Administered 2019-01-13 – 2019-01-15 (×4): via INTRAVENOUS
  Filled 2019-01-13 (×5): qty 1000

## 2019-01-13 MED ORDER — OXYCODONE HCL 5 MG PO TABS
5.0000 mg | ORAL_TABLET | Freq: Four times a day (QID) | ORAL | Status: DC | PRN
  Administered 2019-01-13 – 2019-01-16 (×7): 5 mg via ORAL
  Filled 2019-01-13 (×7): qty 1

## 2019-01-13 MED ORDER — POLYETHYLENE GLYCOL 3350 17 G PO PACK
17.0000 g | PACK | Freq: Every day | ORAL | Status: DC
  Administered 2019-01-13 – 2019-01-15 (×3): 17 g via ORAL
  Filled 2019-01-13 (×4): qty 1

## 2019-01-13 MED ORDER — ONDANSETRON HCL 4 MG/2ML IJ SOLN: 4.0000 mg | Freq: Four times a day (QID) | INTRAMUSCULAR | Status: DC | PRN

## 2019-01-13 MED ORDER — ONDANSETRON 4 MG PO TBDP
4.0000 mg | ORAL_TABLET | Freq: Four times a day (QID) | ORAL | Status: DC | PRN
  Administered 2019-01-14 – 2019-01-15 (×2): 4 mg via ORAL
  Filled 2019-01-13 (×2): qty 1

## 2019-01-13 MED ORDER — PAROXETINE HCL 20 MG PO TABS
20.0000 mg | ORAL_TABLET | Freq: Every day | ORAL | Status: DC
  Administered 2019-01-13 – 2019-01-16 (×4): 20 mg via ORAL
  Filled 2019-01-13 (×4): qty 1

## 2019-01-13 MED ORDER — IBUPROFEN 200 MG PO TABS
600.0000 mg | ORAL_TABLET | Freq: Four times a day (QID) | ORAL | Status: DC | PRN
  Administered 2019-01-13 – 2019-01-14 (×2): 600 mg via ORAL
  Filled 2019-01-13 (×2): qty 3

## 2019-01-13 MED ORDER — ACETAMINOPHEN 325 MG PO TABS
650.0000 mg | ORAL_TABLET | Freq: Four times a day (QID) | ORAL | Status: DC | PRN
  Administered 2019-01-13 – 2019-01-14 (×2): 650 mg via ORAL
  Filled 2019-01-13 (×2): qty 2

## 2019-01-13 MED ORDER — POTASSIUM CHLORIDE 10 MEQ/100ML IV SOLN
10.0000 meq | INTRAVENOUS | Status: AC
  Administered 2019-01-13 (×3): 10 meq via INTRAVENOUS
  Filled 2019-01-13 (×2): qty 100

## 2019-01-13 MED ORDER — PANTOPRAZOLE SODIUM 20 MG PO TBEC
20.0000 mg | DELAYED_RELEASE_TABLET | Freq: Two times a day (BID) | ORAL | Status: DC
  Administered 2019-01-13 – 2019-01-16 (×8): 20 mg via ORAL
  Filled 2019-01-13 (×9): qty 1

## 2019-01-13 NOTE — H&P (Signed)
Danielle Villa is an 58 y.o. female.   Chief Complaint: abd pain HPI: The patient is a 58 year old black female who presents 6 days s/p left colectomy. She was d/c yesterday but continued to have abd pain so she came back to ER. Denies fever. Is having diarrhea. CT shows lots of stool in right colon  Past Medical History:  Diagnosis Date  . Anxiety   . Arthritis   . Back pain   . Depression   . Headache     Past Surgical History:  Procedure Laterality Date  . APPLICATION OF WOUND VAC  01/06/2019   Procedure: APPLICATION OF WOUND VAC;  Surgeon: Clovis Riley, MD;  [facility]: [facility];  Service: General;;  . cyst removed from ovary     . CYSTOSCOPY WITH STENT PLACEMENT Bilateral 01/06/2019   Procedure: CYSTOSCOPY WITH BILATERAL STENT PLACEMENT;  Surgeon: Raynelle Bring, MD;  [facility];  Service: Urology;  Laterality: Bilateral;  . LAPAROSCOPIC SIGMOID COLECTOMY N/A 01/06/2019   Procedure: LAPAROSCOPIC EXTENDED LEFT HEMI-  COLECTOMY;  Surgeon: Clovis Riley, MD;  [facility];  Service: General;  Laterality: N/A;  . OOPHORECTOMY  01/06/2019   Procedure: LEFT OOPHORECTOMY;  Surgeon: Clovis Riley, MD;  [facility];  Service: General;;  . PROCTOSCOPY  01/06/2019   Procedure: PROCTOSCOPY;  Surgeon: Clovis Riley, MD;  [facility];  Service: General;;  . TONSILLECTOMY      Family History  Problem Relation Age of Onset  . Hypertension Mother   . Diabetes Mother   . Hypertension Maternal Grandmother    Social History:  reports that she has quit smoking. She has never used smokeless tobacco. She reports current alcohol use. She reports that she does not use drugs.  Allergies:  Allergies  Allergen Reactions  . Other     BLOOD PRODUCT REFUSAL     (Not in a hospital admission)   Results for orders placed or performed during the hospital encounter of 01/12/19 (from the past 48 hour(s))  CBC with Differential/Platelet     Status: Abnormal    Collection Time: 01/12/19  9:02 PM  Result Value Ref Range   WBC 10.3 4.0 - 10.5 K/uL   RBC 3.02 (L) 3.87 - 5.11 MIL/uL   Hemoglobin 8.2 (L) 12.0 - 15.0 g/dL   HCT 27.1 (L) 36.0 - 46.0 %   MCV 89.7 80.0 - 100.0 fL   MCH 27.2 26.0 - 34.0 pg   MCHC 30.3 30.0 - 36.0 g/dL   RDW 17.2 (H) 11.5 - 15.5 %   Platelets 414 (H) 150 - 400 K/uL   nRBC 0.0 0.0 - 0.2 %   Neutrophils Relative % 83 %   Neutro Abs 8.5 (H) 1.7 - 7.7 K/uL   Lymphocytes Relative 9 %   Lymphs Abs 1.0 0.7 - 4.0 K/uL   Monocytes Relative 5 %   Monocytes Absolute 0.5 0.1 - 1.0 K/uL   Eosinophils Relative 2 %   Eosinophils Absolute 0.2 0.0 - 0.5 K/uL   Basophils Relative 0 %   Basophils Absolute 0.0 0.0 - 0.1 K/uL   Immature Granulocytes 1 %   Abs Immature Granulocytes 0.14 (H) 0.00 - 0.07 K/uL    Comment: Performed at [facility], [place] [address], [place], [place] [address]  Comprehensive metabolic panel     Status: Abnormal   Collection Time: 01/12/19  9:02 PM  Result Value Ref Range   Sodium 137 135 - 145  mmol/L   Potassium 3.7 3.5 - 5.1 mmol/L   Chloride 103 98 - 111 mmol/L   CO2 26 22 - 32 mmol/L   Glucose, Bld 137 (H) 70 - 99 mg/dL   BUN 15 6 - 20 mg/dL   Creatinine, Ser 0.51 0.44 - 1.00 mg/dL   Calcium 8.5 (L) 8.9 - 10.3 mg/dL   Total Protein 6.6 6.5 - 8.1 g/dL   Albumin 3.1 (L) 3.5 - 5.0 g/dL   AST 59 (H) 15 - 41 U/L   ALT 49 (H) 0 - 44 U/L   Alkaline Phosphatase 56 38 - 126 U/L   Total Bilirubin 0.5 0.3 - 1.2 mg/dL   GFR calc non Af Amer >60 >60 mL/min   GFR calc Af Amer >60 >60 mL/min   Anion gap 8 5 - 15    Comment: Performed at [facility], [place] [address], [place], [place] [address]  Lipase, blood     Status: None   Collection Time: 01/12/19  9:02 PM  Result Value Ref Range   Lipase 29 11 - 51 U/L    Comment: Performed at [facility], [place] [address] [place], [place] [address]  Lactic acid, plasma     Status: None   Collection Time:  01/12/19  9:02 PM  Result Value Ref Range   Lactic Acid, Venous 0.7 0.5 - 1.9 mmol/L    Comment: Performed at [facility], [place] [address] [place], [place] [address]  Urinalysis, Routine w reflex microscopic     Status: Abnormal   Collection Time: 01/12/19  9:03 PM  Result Value Ref Range   Color, Urine YELLOW YELLOW   APPearance HAZY (A) CLEAR   Specific Gravity, Urine 1.018 1.005 - 1.030   pH 6.0 5.0 - 8.0   Glucose, UA NEGATIVE NEGATIVE mg/dL   Hgb urine dipstick NEGATIVE NEGATIVE   Bilirubin Urine NEGATIVE NEGATIVE   Ketones, ur 80 (A) NEGATIVE mg/dL   Protein, ur NEGATIVE NEGATIVE mg/dL   Nitrite NEGATIVE NEGATIVE   Leukocytes, UA NEGATIVE NEGATIVE    Comment: Performed at [place] [address] [place], [place] [address]  I-stat troponin, ED     Status: None   Collection Time: 01/12/19  9:41 PM  Result Value Ref Range   Troponin i, poc 0.00 0.00 - 0.08 ng/mL   Comment 3            Comment: Due to the release kinetics of cTnI, a negative result within the first hours of the onset of symptoms does not rule out myocardial infarction with certainty. If myocardial infarction is still suspected, repeat the test at appropriate intervals.    Ct Abdomen Pelvis W Contrast  Result Date: 01/12/2019 CLINICAL DATA:  58 year old female with acute abdominal pain for 2 days. Laparoscopic sigmoid colectomy on 01/06/2019 for diverticulitis. EXAM: CT ABDOMEN AND PELVIS WITH CONTRAST TECHNIQUE: Multidetector CT imaging of the abdomen and pelvis was performed using the standard protocol following bolus administration of intravenous contrast. CONTRAST:  163mL ISOVUE-300 IOPAMIDOL (ISOVUE-300) INJECTION 61% COMPARISON:  01/05/2019 FINDINGS: Lower chest: Minimal bibasilar atelectasis noted. Hepatobiliary: The liver and gallbladder are unremarkable. No biliary dilatation. Pancreas: Unremarkable Spleen: Unremarkable Adrenals/Urinary Tract: The kidneys and  adrenal glands are unremarkable. Small amount of gas in the bladder may be from recent catheterization or infection. Stomach/Bowel: Sigmoid colectomy changes are identified. Stranding in the surgical bed and a single focus of pneumoperitoneum anterior to the liver noted, not unexpected  given recent surgery. A 0.7 x 1.8 cm collection within the low posterior LEFT pelvis appears unchanged from 01/05/2019. There is mild distention of the colon proximal to the anastomotic site but there is stool in the rectum. No small bowel distention identified. Vascular/Lymphatic: No evidence of aortic aneurysm or enlarged lymph nodes. Reproductive: No definite abnormalities except for probable fibroids again noted. Other: No ascites. Musculoskeletal: No acute or suspicious bony abnormalities. IMPRESSION: 1. Status post sigmoid colectomy changes with stranding in the surgical bed and single focus of pneumoperitoneum anterior to the liver, not unexpected given recent surgery. Stable 0.7 x 1.8 cm collection within the low posterior LEFT pelvis. 2. Mild distention of the colon proximal to the anastomotic site without evidence of small-bowel obstruction. This could represent a low-grade colonic obstruction at the anastomotic site-correlate clinically. Electronically Signed   By: Margarette Canada M.D.   On: 01/12/2019 23:25   Dg Chest Port 1 View  Result Date: 01/12/2019 CLINICAL DATA:  Recently discharged for surgery for diverticulitis. Possible infection EXAM: PORTABLE CHEST 1 VIEW COMPARISON:  01/05/2019 FINDINGS: The heart size and mediastinal contours are within normal limits. Both lungs are clear. The visualized skeletal structures are unremarkable. IMPRESSION: No active disease. Electronically Signed   By: Kathreen Devoid   On: 01/12/2019 21:16    Review of Systems  Constitutional: Negative.   HENT: Negative.   Eyes: Negative.   Respiratory: Negative.   Cardiovascular: Positive for chest pain.  Gastrointestinal: Positive for  abdominal pain and diarrhea.  Genitourinary: Negative.   Musculoskeletal: Positive for neck pain.  Skin: Negative.   Neurological: Negative.   Endo/Heme/Allergies: Negative.   Psychiatric/Behavioral: Negative.     Blood pressure 120/73, pulse 91, temperature 99.3 F (37.4 C), temperature source Rectal, resp. rate 20, SpO2 100 %. Physical Exam  Constitutional: She is oriented to person, place, and time. She appears well-developed and well-nourished. No distress.  HENT:  Head: Normocephalic and atraumatic.  Mouth/Throat: No oropharyngeal exudate.  Eyes: Pupils are equal, round, and reactive to light. Conjunctivae and EOM are normal.  Neck: Normal range of motion. Neck supple.  Cardiovascular: Normal rate, regular rhythm and normal heart sounds.  Respiratory: Effort normal and breath sounds normal. No stridor. No respiratory distress.  GI: Soft. Bowel sounds are normal. There is abdominal tenderness.  Musculoskeletal: Normal range of motion.        General: No tenderness or edema.  Neurological: She is alert and oriented to person, place, and time. Coordination normal.  Skin: Skin is warm and dry. No rash noted.  Psychiatric: She has a normal mood and affect. Her behavior is normal. Thought content normal.     Assessment/Plan The patient appears to have constipation after recent surgery. Will admit and check for c diff given loose stool. Will d/w primary team in am  Autumn Messing III, MD 01/13/2019, 12:30 AM

## 2019-01-13 NOTE — Progress Notes (Addendum)
S: She looks and feels well this morning. Continues to have loose non-bloody bowel movements, no flatus. Pain is well controlled. She states that she went home late Saturday and her pharmacy was closed, so was unable to take anything for pain for about 24 hours. she was afraid to eat. She did have nausea and abdominal/ chest pain, which is how she felt previously when initially diagnosed with diverticulitis.   Vitals, labs, intake/output, and orders reviewed at this time. Afebrile, no tachycardia, no leukocytosis/acidosis/AKI. Hgb stable. Urine ++ ketones and borderline high specific gravity. Hypokalemia 3.4  Gen: A&Ox3, no distress, sitting up at edge of bed eating breakfast H&N: EOMI, atraumatic, neck supple Chest: unlabored respirations, RRR Abd: soft, minimally tender, minimally distended (no change from last time I examined her on Friday), incision c/d/i with staples, no evidence of infection Ext: warm, no edema Neuro: grossly normal  Lines/tubes/drains: PIV  A/P: POD 7 s/p lap converted to open extended left hemicolectomy with low colorectal anastomosis for diverticulitis. Path reviewed- no malignancy, inflamed segment strictured down to 0.1cm lumen. Discharged 2/1 and returned to ER/ readmission 2/2. CT noted with dilated right and transverse colon, no small bowel dilation, no anastomotic complication, in fact circular staple line is visibly patent. Suspect mild colonic ileus as she has continued to have loose bowel movements throughout her course.   Will advance to full liquids, add boost/ ensure  Hypokalemia- replace IV, also has K in IVF  Continue ambulating as much as tolerated  Encourage pulmonary toilet/ IS  Post-op pain- tylenol, ibuprofen, oxycodone, tramadol. No IV medications please.   Acute blood loss anemia- hgb 7.3 (stable from last inpatient check Thursday), baseline is between 11-12.  Clinically no apparent ongoing bleeding. Patient is Jehovah's Witness. Continue MVI  with minerals, hold additional Iron at this time as this can contribute to constipation noted on CT  DVT ppx- SCDs, ambulation, SQH   ID- afebrile, no leukocytosis. Received cefotetan x 2 periop doses. C. Diff negative.   Continue supportive care, replace lytes as needed. Clinically no bowel obstruction. Will get plain films tomorrow.   Romana Juniper, MD [facility], [place] Pager [phone]

## 2019-01-13 NOTE — ED Notes (Signed)
Pt made aware that stool sample is needed 

## 2019-01-13 NOTE — ED Notes (Signed)
ED TO INPATIENT HANDOFF REPORT  Name/Age/Gender Danielle Villa 58 y.o. female  Code Status    Code Status Orders  (From admission, onward)         Start     Ordered   01/13/19 0214  Full code  Continuous     01/13/19 0213        Code Status History    Date Active Date Inactive Code Status Order ID Comments User Context   01/06/2019 1856 01/11/2019 2319 Full Code [id number]  Clovis Riley, MD Inpatient      Home/SNF/Other Home  Chief Complaint Chest Pain; Abdominal Pain  Level of Care/Admitting Diagnosis ED Disposition    ED Disposition Condition [place]: [facility] [[id number]]  Level of Care: Med-Surg [16]  Diagnosis: H/O colectomy [542706]  Admitting Physician: Jovita Kussmaul [phone]  Attending Physician: Clovis Riley [[id number]]  PT Class (Do Not Modify): Observation [104]  PT Acc Code (Do Not Modify): Observation [10022]       Medical History Past Medical History:  Diagnosis Date  . Anxiety   . Arthritis   . Back pain   . Depression   . Headache     Allergies Allergies  Allergen Reactions  . Other     BLOOD PRODUCT REFUSAL     IV Location/Drains/Wounds Patient Lines/Drains/Airways Status   Active Line/Drains/Airways    Name:   Placement date:   Placement time:   Site:   Days:   Peripheral IV 01/12/19 Left Antecubital   01/12/19    2027    Antecubital   1   Incision (Closed) 01/06/19 Abdomen   01/06/19    1742     7   Incision - 3 Ports Abdomen Left;Upper Right;Upper Right;Medial   01/06/19    -     7          Labs/Imaging Results for orders placed or performed during the hospital encounter of 01/12/19 (from the past 48 hour(s))  CBC with Differential/Platelet     Status: Abnormal   Collection Time: 01/12/19  9:02 PM  Result Value Ref Range   WBC 10.3 4.0 - 10.5 K/uL   RBC 3.02 (L) 3.87 - 5.11 MIL/uL   Hemoglobin 8.2 (L) 12.0 - 15.0 g/dL   HCT 27.1 (L) 36.0 - 46.0 %   MCV 89.7 80.0 -  100.0 fL   MCH 27.2 26.0 - 34.0 pg   MCHC 30.3 30.0 - 36.0 g/dL   RDW 17.2 (H) 11.5 - 15.5 %   Platelets 414 (H) 150 - 400 K/uL   nRBC 0.0 0.0 - 0.2 %   Neutrophils Relative % 83 %   Neutro Abs 8.5 (H) 1.7 - 7.7 K/uL   Lymphocytes Relative 9 %   Lymphs Abs 1.0 0.7 - 4.0 K/uL   Monocytes Relative 5 %   Monocytes Absolute 0.5 0.1 - 1.0 K/uL   Eosinophils Relative 2 %   Eosinophils Absolute 0.2 0.0 - 0.5 K/uL   Basophils Relative 0 %   Basophils Absolute 0.0 0.0 - 0.1 K/uL   Immature Granulocytes 1 %   Abs Immature Granulocytes 0.14 (H) 0.00 - 0.07 K/uL    Comment: Performed at [facility], [place] [address] [place], [place] [address]  Comprehensive metabolic panel     Status: Abnormal   Collection Time: 01/12/19  9:02 PM  Result Value Ref Range   Sodium 137 135 - 145 mmol/L  Potassium 3.7 3.5 - 5.1 mmol/L   Chloride 103 98 - 111 mmol/L   CO2 26 22 - 32 mmol/L   Glucose, Bld 137 (H) 70 - 99 mg/dL   BUN 15 6 - 20 mg/dL   Creatinine, Ser 0.51 0.44 - 1.00 mg/dL   Calcium 8.5 (L) 8.9 - 10.3 mg/dL   Total Protein 6.6 6.5 - 8.1 g/dL   Albumin 3.1 (L) 3.5 - 5.0 g/dL   AST 59 (H) 15 - 41 U/L   ALT 49 (H) 0 - 44 U/L   Alkaline Phosphatase 56 38 - 126 U/L   Total Bilirubin 0.5 0.3 - 1.2 mg/dL   GFR calc non Af Amer >60 >60 mL/min   GFR calc Af Amer >60 >60 mL/min   Anion gap 8 5 - 15    Comment: Performed at [facility], [place] [address] [place], [place] [address]  Lipase, blood     Status: None   Collection Time: 01/12/19  9:02 PM  Result Value Ref Range   Lipase 29 11 - 51 U/L    Comment: Performed at [facility], [place] [address] [place], [place] [address]  Lactic acid, plasma     Status: None   Collection Time: 01/12/19  9:02 PM  Result Value Ref Range   Lactic Acid, Venous 0.7 0.5 - 1.9 mmol/L    Comment: Performed at [facility], [place] [address], [place], [place] [address]  Urinalysis, Routine w  reflex microscopic     Status: Abnormal   Collection Time: 01/12/19  9:03 PM  Result Value Ref Range   Color, Urine YELLOW YELLOW   APPearance HAZY (A) CLEAR   Specific Gravity, Urine 1.018 1.005 - 1.030   pH 6.0 5.0 - 8.0   Glucose, UA NEGATIVE NEGATIVE mg/dL   Hgb urine dipstick NEGATIVE NEGATIVE   Bilirubin Urine NEGATIVE NEGATIVE   Ketones, ur 80 (A) NEGATIVE mg/dL   Protein, ur NEGATIVE NEGATIVE mg/dL   Nitrite NEGATIVE NEGATIVE   Leukocytes, UA NEGATIVE NEGATIVE    Comment: Performed at [place] [address] [place], [place] [address]  I-stat troponin, ED     Status: None   Collection Time: 01/12/19  9:41 PM  Result Value Ref Range   Troponin i, poc 0.00 0.00 - 0.08 ng/mL   Comment 3            Comment: Due to the release kinetics of cTnI, a negative result within the first hours of the onset of symptoms does not rule out myocardial infarction with certainty. If myocardial infarction is still suspected, repeat the test at appropriate intervals.   C difficile quick scan w PCR reflex     Status: None   Collection Time: 01/13/19 12:34 AM  Result Value Ref Range   C Diff antigen NEGATIVE NEGATIVE   C Diff toxin NEGATIVE NEGATIVE   C Diff interpretation No C. difficile detected.     Comment: Performed at [facility], [place] [address], [place], [place] [address]   Ct Abdomen Pelvis W Contrast  Result Date: 01/12/2019 CLINICAL DATA:  58 year old female with acute abdominal pain for 2 days. Laparoscopic sigmoid colectomy on 01/06/2019 for diverticulitis. EXAM: CT ABDOMEN AND PELVIS WITH CONTRAST TECHNIQUE: Multidetector CT imaging of the abdomen and pelvis was performed using the standard protocol following bolus administration of intravenous contrast. CONTRAST:  127mL ISOVUE-300 IOPAMIDOL (ISOVUE-300) INJECTION 61% COMPARISON:  01/05/2019 FINDINGS: Lower chest: Minimal bibasilar  atelectasis noted. Hepatobiliary: The liver and gallbladder  are unremarkable. No biliary dilatation. Pancreas: Unremarkable Spleen: Unremarkable Adrenals/Urinary Tract: The kidneys and adrenal glands are unremarkable. Small amount of gas in the bladder may be from recent catheterization or infection. Stomach/Bowel: Sigmoid colectomy changes are identified. Stranding in the surgical bed and a single focus of pneumoperitoneum anterior to the liver noted, not unexpected given recent surgery. A 0.7 x 1.8 cm collection within the low posterior LEFT pelvis appears unchanged from 01/05/2019. There is mild distention of the colon proximal to the anastomotic site but there is stool in the rectum. No small bowel distention identified. Vascular/Lymphatic: No evidence of aortic aneurysm or enlarged lymph nodes. Reproductive: No definite abnormalities except for probable fibroids again noted. Other: No ascites. Musculoskeletal: No acute or suspicious bony abnormalities. IMPRESSION: 1. Status post sigmoid colectomy changes with stranding in the surgical bed and single focus of pneumoperitoneum anterior to the liver, not unexpected given recent surgery. Stable 0.7 x 1.8 cm collection within the low posterior LEFT pelvis. 2. Mild distention of the colon proximal to the anastomotic site without evidence of small-bowel obstruction. This could represent a low-grade colonic obstruction at the anastomotic site-correlate clinically. Electronically Signed   By: Margarette Canada M.D.   On: 01/12/2019 23:25   Dg Chest Port 1 View  Result Date: 01/12/2019 CLINICAL DATA:  Recently discharged for surgery for diverticulitis. Possible infection EXAM: PORTABLE CHEST 1 VIEW COMPARISON:  01/05/2019 FINDINGS: The heart size and mediastinal contours are within normal limits. Both lungs are clear. The visualized skeletal structures are unremarkable. IMPRESSION: No active disease. Electronically Signed   By: Kathreen Devoid   On: 01/12/2019 21:16    Pending Labs Unresulted Labs (From admission, onward)    Start      Ordered   01/13/19 8676  Basic metabolic panel  Tomorrow morning,   R     01/13/19 0213   01/13/19 0500  CBC  Tomorrow morning,   R     01/13/19 0213   01/13/19 0214  HIV antibody (Routine Testing)  Once,   R     01/13/19 0213   01/12/19 2103  Urine Culture  ONCE - STAT,   STAT     01/12/19 2102   01/12/19 2102  Culture, blood (Routine X 2) w Reflex to ID Panel  BLOOD CULTURE X 2,   R     01/12/19 2101          Vitals/Pain Today's Vitals   01/13/19 0141 01/13/19 0200 01/13/19 0230 01/13/19 0300  BP: 107/63 111/66 117/76 100/64  Pulse: 93 92 85 82  Resp: (!) 22 17 17 16   Temp:      TempSrc:      SpO2: 99% 99% 98% 98%    Isolation Precautions Enteric precautions (UV disinfection)  Medications Medications  iopamidol (ISOVUE-300) 61 % injection (has no administration in time range)  sodium chloride (PF) 0.9 % injection (has no administration in time range)  pantoprazole (PROTONIX) EC tablet 20 mg (20 mg Oral Given 01/13/19 0223)  oxyCODONE (Oxy IR/ROXICODONE) immediate release tablet 5 mg (has no administration in time range)  traMADol (ULTRAM) tablet 50 mg (has no administration in time range)  atorvastatin (LIPITOR) tablet 20 mg (has no administration in time range)  PARoxetine (PAXIL) tablet 20 mg (has no administration in time range)  traZODone (DESYREL) tablet 50 mg (50 mg Oral Given 01/13/19 0223)  heparin injection 5,000 Units (has no administration in time range)  dextrose 5 %  and 0.9 % NaCl with KCl 20 mEq/L infusion ( Intravenous New Bag/Given 01/13/19 0256)  ondansetron (ZOFRAN-ODT) disintegrating tablet 4 mg (has no administration in time range)    Or  ondansetron [facility]) injection 4 mg (has no administration in time range)  sodium chloride 0.9 % bolus 1,000 mL (0 mLs Intravenous Stopped 01/12/19 2344)  iopamidol (ISOVUE-300) 61 % injection 100 mL (100 mLs Intravenous Contrast Given 01/12/19 2251)  sodium chloride 0.9 % bolus 1,000 mL (0 mLs Intravenous Stopped  01/13/19 0134)    Mobility walks

## 2019-01-13 NOTE — Progress Notes (Signed)
Attempted to give iv potassium, after patient had finished her shower.  Each time I came by, patient stated that she needed some more time, and that she wanted to walk in the halls.  When coming by to offer to help patient walk, she asked that I come back in 15 minutes, because she was on the phone.

## 2019-01-14 ENCOUNTER — Observation Stay (HOSPITAL_COMMUNITY): Payer: Medicaid Other

## 2019-01-14 DIAGNOSIS — R402142 Coma scale, eyes open, spontaneous, at arrival to emergency department: Secondary | ICD-10-CM | POA: Diagnosis not present

## 2019-01-14 DIAGNOSIS — K567 Ileus, unspecified: Secondary | ICD-10-CM | POA: Diagnosis not present

## 2019-01-14 DIAGNOSIS — B952 Enterococcus as the cause of diseases classified elsewhere: Secondary | ICD-10-CM | POA: Diagnosis present

## 2019-01-14 DIAGNOSIS — R079 Chest pain, unspecified: Secondary | ICD-10-CM | POA: Diagnosis not present

## 2019-01-14 DIAGNOSIS — Z9049 Acquired absence of other specified parts of digestive tract: Secondary | ICD-10-CM | POA: Diagnosis not present

## 2019-01-14 DIAGNOSIS — Z8249 Family history of ischemic heart disease and other diseases of the circulatory system: Secondary | ICD-10-CM | POA: Diagnosis not present

## 2019-01-14 DIAGNOSIS — E876 Hypokalemia: Secondary | ICD-10-CM | POA: Diagnosis present

## 2019-01-14 DIAGNOSIS — R402252 Coma scale, best verbal response, oriented, at arrival to emergency department: Secondary | ICD-10-CM | POA: Diagnosis not present

## 2019-01-14 DIAGNOSIS — Z7982 Long term (current) use of aspirin: Secondary | ICD-10-CM | POA: Diagnosis not present

## 2019-01-14 DIAGNOSIS — Z833 Family history of diabetes mellitus: Secondary | ICD-10-CM | POA: Diagnosis not present

## 2019-01-14 DIAGNOSIS — Z87891 Personal history of nicotine dependence: Secondary | ICD-10-CM | POA: Diagnosis not present

## 2019-01-14 DIAGNOSIS — E7800 Pure hypercholesterolemia, unspecified: Secondary | ICD-10-CM | POA: Diagnosis present

## 2019-01-14 DIAGNOSIS — G8918 Other acute postprocedural pain: Secondary | ICD-10-CM | POA: Diagnosis present

## 2019-01-14 DIAGNOSIS — M1990 Unspecified osteoarthritis, unspecified site: Secondary | ICD-10-CM | POA: Diagnosis not present

## 2019-01-14 DIAGNOSIS — N390 Urinary tract infection, site not specified: Secondary | ICD-10-CM | POA: Diagnosis not present

## 2019-01-14 DIAGNOSIS — Z531 Procedure and treatment not carried out because of patient's decision for reasons of belief and group pressure: Secondary | ICD-10-CM | POA: Diagnosis present

## 2019-01-14 DIAGNOSIS — K5900 Constipation, unspecified: Secondary | ICD-10-CM | POA: Diagnosis present

## 2019-01-14 DIAGNOSIS — K9189 Other postprocedural complications and disorders of digestive system: Secondary | ICD-10-CM | POA: Diagnosis not present

## 2019-01-14 DIAGNOSIS — R402362 Coma scale, best motor response, obeys commands, at arrival to emergency department: Secondary | ICD-10-CM | POA: Diagnosis not present

## 2019-01-14 DIAGNOSIS — G4720 Circadian rhythm sleep disorder, unspecified type: Secondary | ICD-10-CM | POA: Diagnosis present

## 2019-01-14 DIAGNOSIS — D62 Acute posthemorrhagic anemia: Secondary | ICD-10-CM | POA: Diagnosis not present

## 2019-01-14 DIAGNOSIS — M25511 Pain in right shoulder: Secondary | ICD-10-CM | POA: Diagnosis not present

## 2019-01-14 LAB — BASIC METABOLIC PANEL
Anion gap: 5 (ref 5–15)
BUN: <5 mg/dL — ABNORMAL LOW (ref 6–20)
CALCIUM: 8.5 mg/dL — AB (ref 8.9–10.3)
CO2: 25 mmol/L (ref 22–32)
Chloride: 112 mmol/L — ABNORMAL HIGH (ref 98–111)
Creatinine, Ser: 0.45 mg/dL (ref 0.44–1.00)
GFR calc Af Amer: >60 mL/min (ref >60)
GFR calc non Af Amer: >60 mL/min (ref >60)
Glucose, Bld: 116 mg/dL — ABNORMAL HIGH (ref 70–99)
Potassium: 4 mmol/L (ref 3.5–5.1)
Sodium: 142 mmol/L (ref 135–145)

## 2019-01-14 LAB — MAGNESIUM: Magnesium: 2.1 mg/dL (ref 1.7–2.4)

## 2019-01-14 MED ORDER — TRAMADOL HCL 50 MG PO TABS: 50.0000 mg | ORAL_TABLET | Freq: Four times a day (QID) | ORAL | Status: DC | PRN

## 2019-01-14 MED ORDER — ACETAMINOPHEN 325 MG PO TABS
650.0000 mg | ORAL_TABLET | Freq: Four times a day (QID) | ORAL | Status: DC | PRN
  Administered 2019-01-14 – 2019-01-15 (×3): 650 mg via ORAL
  Filled 2019-01-14 (×3): qty 2

## 2019-01-14 MED ORDER — IBUPROFEN 200 MG PO TABS
600.0000 mg | ORAL_TABLET | Freq: Four times a day (QID) | ORAL | Status: DC | PRN
  Administered 2019-01-14 – 2019-01-16 (×3): 600 mg via ORAL
  Filled 2019-01-14 (×3): qty 3

## 2019-01-14 MED ORDER — DOCUSATE SODIUM 100 MG PO CAPS: 100.0000 mg | ORAL_CAPSULE | Freq: Every day | ORAL | Status: DC

## 2019-01-14 NOTE — Progress Notes (Addendum)
S: she continues to feel well.  Tolerating full liquids without difficulty, no nausea last 24 hours, has not received any meds for nausea.  No vomiting. Continues to have bowel movements, states they're little bit less watery now.  Still no flatus.  She has received Tylenol and ibuprofen, 3 doses of PO narcotics since admission.  Vitals, labs, intake/output, and orders reviewed at this time. Afebrile, no tachycardia, electrolytes are within normal limits. PO 1050. UOP x 6, BM x 1  Gen: A&Ox3, no distress, walking around the room H&N: EOMI, atraumatic, neck supple Chest: unlabored respirations, RRR Abd: soft, minimally tender, minimally distended (seems slightly less even than yesterday), incision c/d/i with staples, no evidence of infection Ext: warm, no edema Neuro: grossly normal  Lines/tubes/drains: PIV  A/P: POD 8 s/p lap converted to open extended left hemicolectomy with low colorectal anastomosis for diverticulitis. Path reviewed- no malignancy, inflamed segment strictured down to 0.1cm lumen. Discharged 2/1 and returned to ER/ readmission 2/2. CT noted with dilated right and transverse colon, no small bowel dilation, no anastomotic complication, in fact circular staple line is visibly patent. Suspect mild colonic ileus as she has continued to have loose bowel movements throughout her course.   Plain films this morning unremarkable- normal small bowel pattern, moderate amount of gas in the right and transverse colon.  Will advance to soft diet, continue boost/ ensure  Wean IV fluids  Continue ambulating as much as tolerated  Encourage pulmonary toilet/ IS  Post-op pain- tylenol, ibuprofen, oxycodone, tramadol. No IV medications please.   Acute blood loss anemia- hgb 7.3 (stable from last inpatient check Thursday), baseline is between 11-12.  Clinically no apparent ongoing bleeding. Patient is Jehov[employer]'s Witness. Continue MVI with minerals, hold additional Iron at this time as this  can contribute to constipation noted on CT  DVT ppx- SCDs, ambulation, SQH   ID- afebrile, no leukocytosis. Received cefotetan x 2 periop doses. C. Diff negative.   Continue supportive care, replace lytes as needed. Clinically no bowel obstruction.  Plan discharge home tomorrow, patient wishes to stay 1 more day.  Romana Juniper, MD [facility], [place] Pager [phone]

## 2019-01-14 NOTE — Progress Notes (Signed)
01/14/2019  1429 Patient states she is having 10-15 BM daily. She is also concerned about having hemorrhoids.

## 2019-01-15 LAB — URINE CULTURE

## 2019-01-15 LAB — GLUCOSE, CAPILLARY: Glucose-Capillary: 138 mg/dL — ABNORMAL HIGH (ref 70–99)

## 2019-01-15 MED ORDER — BUTALBITAL-APAP-CAFFEINE 50-325-40 MG PO TABS: 1.0000 | ORAL_TABLET | Freq: Four times a day (QID) | ORAL | Status: DC | PRN

## 2019-01-15 MED ORDER — HYDROCORTISONE 2.5 % RE CREA
TOPICAL_CREAM | Freq: Two times a day (BID) | RECTAL | Status: DC | PRN
  Filled 2019-01-15: qty 28.35

## 2019-01-15 MED ORDER — METHOCARBAMOL 1000 MG/10ML IJ SOLN
500.0000 mg | Freq: Once | INTRAVENOUS | Status: AC
  Administered 2019-01-15: 500 mg via INTRAVENOUS
  Filled 2019-01-15 (×2): qty 5
  Filled 2019-01-15: qty 500

## 2019-01-15 MED ORDER — METOCLOPRAMIDE HCL 5 MG/ML IJ SOLN
10.0000 mg | Freq: Four times a day (QID) | INTRAMUSCULAR | Status: DC
  Administered 2019-01-15 – 2019-01-16 (×4): 10 mg via INTRAVENOUS
  Filled 2019-01-15 (×4): qty 2

## 2019-01-15 MED ORDER — NITROFURANTOIN MONOHYD MACRO 100 MG PO CAPS
100.0000 mg | ORAL_CAPSULE | Freq: Two times a day (BID) | ORAL | Status: DC
  Administered 2019-01-15 – 2019-01-16 (×3): 100 mg via ORAL
  Filled 2019-01-15 (×4): qty 1

## 2019-01-15 NOTE — Progress Notes (Signed)
S: She has a headache and sharp right shoulder pain this morning and is crying. This is the first time in the 10 days she has been in the hospital that she as acted like she felt bad. Poor appetite yesterday, nausea overnight and did require 2 doses of zofran overnight. Continues to have bowel movements- 3 are recorded last 24h- and today is starting to pass flatus. Does not endorse abdominal pain. Does note burning perianal pain when she is cleaning up after bowel movements. Worried there is an odor to her incision. Had some sweats overnight but no fever.    Vitals, labs, intake/output, and orders reviewed at this time. Tmax 99.2, HR 74-88, mildly hypertensive [id number] systolic, sats 993% room air. No I/O recorded at all for day shift yesterday. Overnight, 220 PO, void x 6 and bm x 3. Urine cutures taken in ER + for enterococcus faecalis.  Gen: A&Ox3, lying in bed in the dark and crying H&N: EOMI, atraumatic, neck supple Chest: unlabored respirations, RRR Abd: soft, nontender, nondistended incision c/d/i with staples, no evidence of infection, there is no abnormal odor or drainage Ext: warm, no edema Neuro: grossly normal  Lines/tubes/drains: PIV  A/P: POD 9 s/p lap converted to open extended left hemicolectomy with low colorectal anastomosis for diverticulitis. Path reviewed- no malignancy, inflamed segment strictured down to 0.1cm lumen. Discharged 2/1 and returned to ER/ readmission 2/2. CT noted with dilated right and transverse colon, no small bowel dilation, no anastomotic complication, in fact circular staple line is visibly patent. She has continued to have loose bowel movements throughout her course.   Continue soft diet, continue boost/ ensure. Add scheduled reglan see if this helps, will decrease bowel regimen further.   Continue IV fluids  Continue ambulating as much as tolerated  Encourage pulmonary toilet/ IS  Post-op pain- tylenol, ibuprofen, oxycodone, tramadol. Will add  robaxin for her shoulder pain and fioricet for her headache, she is already getting tylenol and motrin. Anusol and sitz baths for perianal irritation.   Acute blood loss anemia- hgb 7.3 (stable from last inpatient check Thursday), baseline is between 11-12.  Clinically no apparent ongoing bleeding. Patient is Jehovah's Witness. Continue MVI with minerals, hold additional Iron at this time as this can contribute to constipation noted on CT  DVT ppx- SCDs, ambulation, SQH   ID- afebrile. Received cefotetan x 2 periop doses. C. Diff negative. Urine cultures taken in ER + for enterococcus- start macrobid   Continue supportive care, it seems as though her abdominal recovery continues to progress appropriately. She has a completely benign abdominal exam. She seems very fretful about leaving the hospital. Headache and shoulder ache this morning. She states if she had a headache/ shoulder pain like this at home, she would call the paramedics; she has been worked up for similar presentation in the ER several times. Will see how added meds do today.  Romana Juniper, MD [facility], [place] Pager [phone]

## 2019-01-15 NOTE — Plan of Care (Signed)
Plan of care reviewed and discussed with the patient.  Expressing concerns, questions answered.

## 2019-01-16 MED ORDER — NITROFURANTOIN MONOHYD MACRO 100 MG PO CAPS: 100.0000 mg | ORAL_CAPSULE | Freq: Two times a day (BID) | ORAL | 0 refills | Status: AC

## 2019-01-16 MED ORDER — HYDROCORTISONE 2.5 % RE CREA: TOPICAL_CREAM | Freq: Two times a day (BID) | RECTAL | 0 refills | Status: DC | PRN

## 2019-01-16 MED ORDER — ONDANSETRON 4 MG PO TBDP: 4.0000 mg | ORAL_TABLET | Freq: Four times a day (QID) | ORAL | 0 refills | Status: DC | PRN

## 2019-01-16 MED ORDER — POLYETHYLENE GLYCOL 3350 17 G PO PACK: 17.0000 g | PACK | Freq: Every day | ORAL | 0 refills | Status: DC

## 2019-01-16 NOTE — Progress Notes (Addendum)
S: She feels completely better this morning and feels she is ready to go home. She credits V8 with her symptom improvement. Tolerating diet though not much of apetite. Passing flatus and stool.    Vitals, labs, intake/output, and orders reviewed at this time. Tmax 98.9. HR 75-90, normotensive, sats 99% room air. Urine cutures taken in ER + for enterococcus faecalis- macrobid sensitive, started 2/5. PO 420, ate 80% dinner.   Gen: A&Ox3, sitting up and in good spirits H&N: EOMI, atraumatic, neck supple Chest: unlabored respirations, RRR Abd: soft, nontender, nondistended incision c/d/i with staples, no evidence of infection, there is no abnormal odor or drainage Ext: warm, no edema Neuro: grossly normal  Lines/tubes/drains: PIV  A/P: POD 10 s/p lap converted to open extended left hemicolectomy with low colorectal anastomosis for diverticulitis. Path reviewed- no malignancy, inflamed segment strictured down to 0.1cm lumen. Discharged 2/1 and returned to ER/ readmission 2/2. CT noted with dilated right and transverse colon, no small bowel dilation, no anastomotic complication, in fact circular staple line is visibly patent. She has continued to have loose bowel movements throughout her course.   Continue soft diet, continue boost/ ensure. Continue ambulating as much as tolerated. Encourage pulmonary toilet/ IS  Post-op pain- tylenol, ibuprofen, oxycodone, tramadol. Anusol and sitz baths for perianal irritation.   Acute blood loss anemia- hgb 7.3 (stable from last inpatient check Thursday), baseline is between 11-12.  Clinically no apparent ongoing bleeding. Patient is Jehov[employer]'s Witness. Continue MVI with minerals, hold additional Iron at this time as this can contribute to constipation noted on CT  DVT ppx- SCDs, ambulation, SQH   ID- afebrile. Received cefotetan x 2 periop doses. C. Diff negative. Urine cultures taken in ER + for enterococcus- macrobid 2/5, plan stop date 2/10  It seems as  though her abdominal recovery continues to progress appropriately. She has a completely benign abdominal exam. She feels ready to leave today.  Headache and shoulder ache resolved- she has had these symptoms numerous times, has called paramedics for them about 4 times in the last year and has been worked up with films, CT, CTPE, EKG etc in the ER. I don't think this can be attributed to her diverticulitis. Advised her to mention this to PCP and close follow up.   Discharge home today. Will print her prescriptions so that she can fill them even if her pharmacy is closed when she leaves. I have called her pharmacy and verified that she did in fact pick up her oxycodone from the last admission so she should have that available at home.  Romana Juniper, MD [facility] Surgery, [place] Pager [phone]

## 2019-01-16 NOTE — Discharge Instructions (Signed)
SURGERY: POST OP INSTRUCTIONS (Surgery for small bowel obstruction, colon resection, etc)   ######################################################################  EAT Gradually transition to a high fiber diet with a fiber supplement over the next few days after discharge  WALK Walk an hour a day.  Control your pain to do that.    CONTROL PAIN Control pain so that you can walk, sleep, tolerate sneezing/coughing, go up/down stairs.  HAVE A BOWEL MOVEMENT DAILY Keep your bowels regular to avoid problems.  OK to try a laxative to override constipation.  OK to use an antidairrheal to slow down diarrhea.  Call if not better after 2 tries  CALL IF YOU HAVE PROBLEMS/CONCERNS Call if you are still struggling despite following these instructions. Call if you have concerns not answered by these instructions  ######################################################################   DIET Follow a light diet the first few days at home.  Start with a bland diet such as soups, liquids, starchy foods, low fat foods, etc.  If you feel full, bloated, or constipated, stay on a ful liquid or pureed/blenderized diet for a few days until you feel better and no longer constipated. Be sure to drink plenty of fluids every day to avoid getting dehydrated (feeling dizzy, not urinating, etc.). Gradually add a fiber supplement to your diet over the next week.  Gradually get back to a regular solid diet.  Avoid fast food or heavy meals the first week as you are more likely to get nauseated. It is expected for your digestive tract to need a few months to get back to normal.  It is common for your bowel movements and stools to be irregular.  You will have occasional bloating and cramping that should eventually fade away.  Until you are eating solid food normally, off all pain medications, and back to regular activities; your bowels will not be normal. Focus on eating a low-fat, high fiber diet the rest of your life  (See Getting to [place], below).  CARE of your INCISION or WOUND It is good for closed incision and even open wounds to be washed every day.  Shower every day.  Short baths are fine.  Wash the incisions and wounds clean with soap & water.    If you have a closed incision(s), wash the incision with soap & water every day.  You may leave closed incisions open to air if it is dry.   You may cover the incision with clean gauze & replace it after your daily shower for comfort. If you have staples, set up an appointment for them to be removed in the office in about 2 weeks after surgery.      ACTIVITIES as tolerated Start light daily activities --- self-care, walking, climbing stairs-- beginning the day after surgery.  Gradually increase activities as tolerated.  Control your pain to be active.  Stop when you are tired.  Ideally, walk several times a day, eventually an hour a day.   Most people are back to most day-to-day activities in a few weeks.  It takes 4-8 weeks to get back to unrestricted, intense activity. If you can walk 30 minutes without difficulty, it is safe to try more intense activity such as jogging, treadmill, bicycling, low-impact aerobics, swimming, etc. Save the most intensive and strenuous activity for last (Usually 4-8 weeks after surgery) such as sit-ups, heavy lifting, contact sports, etc.  Refrain from any intense heavy lifting or straining until you are off narcotics for pain control.  You will have off days, but  things should improve week-by-week. DO NOT PUSH THROUGH PAIN.  Let pain be your guide: If it hurts to do something, don't do it.  Pain is your body warning you to avoid that activity for another week until the pain goes down. You may drive when you are no longer taking narcotic prescription pain medication, you can comfortably wear a seatbelt, and you can safely make sudden turns/stops to protect yourself without hesitating due to pain. You may have sexual  intercourse when it is comfortable. If it hurts to do something, stop.  MEDICATIONS Take your usually prescribed home medications unless otherwise directed.   Blood thinners:  Usually you can restart any strong blood thinners after the second postoperative day.  It is OK to take aspirin right away.     If you are on strong blood thinners (warfarin/Coumadin, Plavix, Xerelto, Eliquis, Pradaxa, etc), discuss with your surgeon, medicine PCP, and/or cardiologist for instructions on when to restart the blood thinner & if blood monitoring is needed (PT/INR blood check, etc).     PAIN CONTROL Pain after surgery or related to activity is often due to strain/injury to muscle, tendon, nerves and/or incisions.  This pain is usually short-term and will improve in a few months.  To help speed the process of healing and to get back to regular activity more quickly, DO THE FOLLOWING THINGS TOGETHER: 1. Increase activity gradually.  DO NOT PUSH THROUGH PAIN 2. Use Ice and/or Heat 3. Try Gentle Massage and/or Stretching 4. Take over the counter pain medication 5. Take Narcotic prescription pain medication for more severe pain  Good pain control = faster recovery.  It is better to take more medicine to be more active than to stay in bed all day to avoid medications. 1.  Increase activity gradually Avoid heavy lifting at first, then increase to lifting as tolerated over the next 6 weeks. Do not push through the pain.  Listen to your body and avoid positions and maneuvers than reproduce the pain.  Wait a few days before trying something more intense Walking an hour a day is encouraged to help your body recover faster and more safely.  Start slowly and stop when getting sore.  If you can walk 30 minutes without stopping or pain, you can try more intense activity (running, jogging, aerobics, cycling, swimming, treadmill, sex, sports, weightlifting, etc.) Remember: If it hurts to do it, then dont do it! 2. Use  Ice and/or Heat You will have swelling and bruising around the incisions.  This will take several weeks to resolve. Ice packs or heating pads (6-8 times a day, 30-60 minutes at a time) will help sooth soreness & bruising. Some people prefer to use ice alone, heat alone, or alternate between ice & heat.  Experiment and see what works best for you.  Consider trying ice for the first few days to help decrease swelling and bruising; then, switch to heat to help relax sore spots and speed recovery. Shower every day.  Short baths are fine.  It feels good!  Keep the incisions and wounds clean with soap & water.   3. Try Gentle Massage and/or Stretching Massage at the area of pain many times a day Stop if you feel pain - do not overdo it 4. Take over the counter pain medication This helps the muscle and nerve tissues become less irritable and calm down faster Choose ONE of the following over-the-counter anti-inflammatory medications: Acetaminophen 500mg  tabs (Tylenol) 1-2 pills with every meal and just  before bedtime (avoid if you have liver problems or if you have acetaminophen in you narcotic prescription) Naproxen 220mg  tabs (ex. Aleve, Naprosyn) 1-2 pills twice a day (avoid if you have kidney, stomach, IBD, or bleeding problems) Ibuprofen 200mg  tabs (ex. Advil, Motrin) 3-4 pills with every meal and just before bedtime (avoid if you have kidney, stomach, IBD, or bleeding problems) Take with food/snack several times a day as directed for at least 2 weeks to help keep pain / soreness down & more manageable. 5. Take Narcotic prescription pain medication for more severe pain A prescription for strong pain control is often given to you upon discharge (for example: oxycodone/Percocet, hydrocodone/Norco/Vicodin, or tramadol/Ultram) Take your pain medication as prescribed. Be mindful that most narcotic prescriptions contain Tylenol (acetaminophen) as well - avoid taking too much Tylenol. If you are having  problems/concerns with the prescription medicine (does not control pain, nausea, vomiting, rash, itching, etc.), please call us [phone] to see if we need to switch you to a different pain medicine that will work better for you and/or control your side effects better. If you need a refill on your pain medication, you must call the office before 4 pm and on weekdays only.  By federal law, prescriptions for narcotics cannot be called into a pharmacy.  They must be filled out on paper & picked up from our office by the patient or authorized caretaker.  Prescriptions cannot be filled after 4 pm nor on weekends.    WHEN TO CALL us [phone] Severe uncontrolled or worsening pain  Fever over 101 F (38.5 C) Concerns with the incision: Worsening pain, redness, rash/hives, swelling, bleeding, or drainage Reactions / problems with new medications (itching, rash, hives, nausea, etc.) Nausea and/or vomiting Difficulty urinating Difficulty breathing Worsening fatigue, dizziness, lightheadedness, blurred vision Other concerns If you are not getting better after two weeks or are noticing you are getting worse, contact our office [phone] for further advice.  We may need to adjust your medications, re-evaluate you in the office, send you to the emergency room, or see what other things we can do to help. The clinic staff is available to answer your questions during regular business hours (8:30am-5pm).  Please dont hesitate to call and ask to speak to one of our nurses for clinical concerns.    A surgeon from [facility] is always on call at the hospitals 24 hours/day If you have a medical emergency, go to the nearest emergency room or call 911.  FOLLOW UP in our office One the day of your discharge from the hospital (or the next business weekday), please call [facility] to set up or confirm an appointment to see your surgeon in the office for a follow-up appointment.   Usually it is 2-3 weeks after your surgery.   If you have skin staples at your incision(s), let the office know so we can set up a time in the office for the nurse to remove them (usually around 10 days after surgery). Make sure that you call for appointments the day of discharge (or the next business weekday) from the hospital to ensure a convenient appointment time. IF YOU HAVE DISABILITY OR FAMILY LEAVE FORMS, BRING THEM TO THE OFFICE FOR PROCESSING.  DO NOT GIVE THEM TO YOUR DOCTOR.  [facility] [address] [place], [place], [place]  [address] ? [phone] - Main [phone] - [place],  [phone] - Fax [email or web address]  GETTING TO  [place]. It is expected for your digestive tract to need a few months to get back to normal.  It is common for your bowel movements and stools to be irregular.  You will have occasional bloating and cramping that should eventually fade away.  Until you are eating solid food normally, off all pain medications, and back to regular activities; your bowels will not be normal.   Avoiding constipation The goal: ONE SOFT BOWEL MOVEMENT A DAY!    Drink plenty of fluids.  Choose water first. TAKE A FIBER SUPPLEMENT EVERY DAY THE REST OF YOUR LIFE During your first week back home, gradually add back a fiber supplement every day Experiment which form you can tolerate.   There are many forms such as powders, tablets, wafers, gummies, etc Psyllium bran (Metamucil), methylcellulose (Citrucel), Miralax or Glycolax, Benefiber, Flax Seed.  Adjust the dose week-by-week (1/2 dose/day to 6 doses a day) until you are moving your bowels 1-2 times a day.  Cut back the dose or try a different fiber product if it is giving you problems such as diarrhea or bloating. Sometimes a laxative is needed to help jump-start bowels if constipated until the fiber supplement can help regulate your bowels.  If you are tolerating eating &  you are farting, it is okay to try a gentle laxative such as double dose MiraLax, prune juice, or Milk of Magnesia.  Avoid using laxatives too often. Stool softeners can sometimes help counteract the constipating effects of narcotic pain medicines.  It can also cause diarrhea, so avoid using for too long. If you are still constipated despite taking fiber daily, eating solids, and a few doses of laxatives, call our office. Controlling diarrhea Try drinking liquids and eating bland foods for a few days to avoid stressing your intestines further. Avoid dairy products (especially milk & ice cream) for a short time.  The intestines often can lose the ability to digest lactose when stressed. Avoid foods that cause gassiness or bloating.  Typical foods include beans and other legumes, cabbage, broccoli, and dairy foods.  Avoid greasy, spicy, fast foods.  Every person has some sensitivity to other foods, so listen to your body and avoid those foods that trigger problems for you. Probiotics (such as active yogurt, Align, etc) may help repopulate the intestines and colon with normal bacteria and calm down a sensitive digestive tract Adding a fiber supplement gradually can help thicken stools by absorbing excess fluid and retrain the intestines to act more normally.  Slowly increase the dose over a few weeks.  Too much fiber too soon can backfire and cause cramping & bloating. It is okay to try and slow down diarrhea with a few doses of antidiarrheal medicines.   Bismuth subsalicylate (ex. Kayopectate, Pepto Bismol) for a few doses can help control diarrhea.  Avoid if pregnant.   Loperamide (Imodium) can slow down diarrhea.  Start with one tablet (2mg ) first.  Avoid if you are having fevers or severe pain.  ILEOSTOMY PATIENTS WILL HAVE CHRONIC DIARRHEA since their colon is not in use.    Drink plenty of liquids.  You will need to drink even more glasses of water/liquid a day to avoid getting dehydrated. Record  output from your ileostomy.  Expect to empty the bag every 3-4 hours at first.  Most people with a permanent ileostomy empty their bag 4-6 times at the least.   Use antidiarrheal medicine (especially Imodium) several times a day to avoid getting dehydrated.  Start  with a dose at bedtime & breakfast.  Adjust up or down as needed.  Increase antidiarrheal medications as directed to avoid emptying the bag more than 8 times a day (every 3 hours). Work with your wound ostomy nurse to learn care for your ostomy.  See ostomy care instructions. TROUBLESHOOTING IRREGULAR BOWELS 1) Start with a soft & bland diet. No spicy, greasy, or fried foods.  2) Avoid gluten/wheat or dairy products from diet to see if symptoms improve. 3) Miralax 17gm or flax seed mixed in [place]. water or juice-daily. May use 2-4 times a day as needed. 4) Gas-X, Phazyme, etc. as needed for gas & bloating.  5) Prilosec (omeprazole) over-the-counter as needed 6)  Consider probiotics (Align, Activa, etc) to help calm the bowels down  Call your doctor if you are getting worse or not getting better.  Sometimes further testing (cultures, endoscopy, X-ray studies, CT scans, bloodwork, etc.) may be needed to help diagnose and treat the cause of the diarrhea. [facility], [place], [place], [place], [place]  [address] [phone] - Main.    [phone]  - Toll Free.   [phone] - Fax [email or web address]

## 2019-01-16 NOTE — Care Management Note (Signed)
Case Management Note  Patient Details  Name: Danielle Villa MRN: [id number] Date of Birth: 1961-03-20  Subjective/Objective:                  discharged  Action/Plan: Discharged to home with self-care, orders checked for hhc needs. No CM needs present at time of discharge.  Patient is able to arrangement own appointments and home care.  Expected Discharge Date:  01/16/19               Expected Discharge Plan:  Home/Self Care  In-House Referral:     Discharge planning Services     Post Acute Care Choice:    Choice offered to:     DME Arranged:    DME Agency:     HH Arranged:    HH Agency:     Status of Service:  Completed, signed off  If discussed at [employer] of Stay Meetings, dates discussed:    Additional Comments:  Leeroy Cha, RN 01/16/2019, 2:03 PM

## 2019-01-18 LAB — CULTURE, BLOOD (ROUTINE X 2)
Culture: NO GROWTH
Culture: NO GROWTH
Special Requests: ADEQUATE
Special Requests: ADEQUATE

## 2019-01-23 DIAGNOSIS — Z7689 Persons encountering health services in other specified circumstances: Secondary | ICD-10-CM | POA: Diagnosis not present

## 2019-02-04 ENCOUNTER — Ambulatory Visit (INDEPENDENT_AMBULATORY_CARE_PROVIDER_SITE_OTHER): Payer: Medicaid Other | Admitting: Family Medicine

## 2019-02-04 ENCOUNTER — Other Ambulatory Visit: Payer: Self-pay

## 2019-02-04 ENCOUNTER — Encounter: Payer: Self-pay | Admitting: Family Medicine

## 2019-02-04 VITALS — BP 103/65 | HR 76 | Temp 98.2°F | Wt 124.0 lb

## 2019-02-04 DIAGNOSIS — G8929 Other chronic pain: Secondary | ICD-10-CM | POA: Diagnosis not present

## 2019-02-04 DIAGNOSIS — M545 Low back pain: Secondary | ICD-10-CM | POA: Diagnosis not present

## 2019-02-04 DIAGNOSIS — Z7689 Persons encountering health services in other specified circumstances: Secondary | ICD-10-CM | POA: Diagnosis not present

## 2019-02-04 MED ORDER — TRAMADOL HCL 50 MG PO TABS: 50.0000 mg | ORAL_TABLET | Freq: Four times a day (QID) | ORAL | 0 refills | Status: DC | PRN

## 2019-02-04 NOTE — Patient Instructions (Signed)
It was a pleasure to see you today! Thank you for choosing [facility] for your primary care. Danielle Villa was seen for back pain.   Our plans for today were:  I will refill your tramadol. Please ask Dr. Kae Heller when you go back to see if it would be ok to go to physical therapy soon.   Best,  Dr. Lindell Noe

## 2019-02-04 NOTE — Progress Notes (Signed)
   CC: f/u back pain, update on diverticulitis surgery  HPI  Recent diverticulitis surgery - Has up to 20 BMs per day, saw Dr. Kae Heller 2 weeks ago. Dr. Kae Heller told her this was likely due to bowels waking up after surgery. She feels somewhat weak due to frequent bowel movement. She sits on her hips alternating sides due to pain. She is using oxycodone from Dr. Kae Heller, she says she is almost out. She recalls Dr. Kae Heller told her she would not be prescribing any additional oxy.   Back pain - same quality as previously, sometimes upper back. Sometimes on her lower back on either side. Feels like sharp pain, sometimes ache. Previously went to chiropractor, unsure about whether she has done PT. Continue bowel urgency as above, no urinary incontinence.   ROS: Denies CP, SOB, abdominal pain, dysuria, changes in BMs.   CC, SH/smoking status, and VS noted  Objective: BP 103/65   Pulse 76   Temp 98.2 F (36.8 C) (Oral)   Wt 124 lb (56.2 kg)   SpO2 98%   BMI 22.68 kg/m  Gen: NAD, alert, cooperative, and pleasant. HEENT: NCAT, EOMI, PERRL CV: RRR, no murmur Resp: CTAB, no wheezes, non-labored MSK: no spinal process tenderness or step offs, TTP over bilateral trapezius muscle bodies and lumbar paraspinal musculature. Ext: No edema, warm Neuro: Alert and oriented, Speech clear, No gross deficits  Assessment and plan:  BACK PAIN, LOW Chronic, unchanged in nature.  Suspect bowel problems are related to recent colon surgery.  No red flags.  Checked database, patient had a recent oxycodone prescription which was appropriate for her recent surgery.  She suggests that the surgeon will not be prescribing additional oxycodone.  I refilled what has been her previous tramadol prescription.  I also asked her to discuss when she would be appropriate for physical therapy with her surgeon at their visit in 2 weeks.  I would really like her to undergo physical therapy for her back pain.  I think this would be  beneficial to her.  She will let me know.   No orders of the defined types were placed in this encounter.   Meds ordered this encounter  Medications  . traMADol (ULTRAM) 50 MG tablet    Sig: Take 1 tablet (50 mg total) by mouth every 6 (six) hours as needed for moderate pain.    Dispense:  30 tablet    Refill:  0    Ralene Ok, MD, PGY3 02/06/2019 7:54 AM

## 2019-02-05 IMAGING — MR MR LUMBAR SPINE W/O CM
5 series · 46 of 48 positions shown · non-contrast
Comparison: CT Abdomen and Pelvis 08/20/2018. Lumbar radiographs
05/09/2016.

CLINICAL DATA: 57-year-old female with chronic midline low back
pain. Bilateral hip and leg weakness and numbness. Most recent
injury in 0541.

EXAM:
MRI LUMBAR SPINE WITHOUT CONTRAST
TECHNIQUE: Multiplanar, multisequence MR imaging of the lumbar spine was
performed. No intravenous contrast was administered.

[Series 3: T2 post-contrast · sagittal · 4.0mm · 0.88mm/px · 6 of 12 slices shown]
[im 1/12]
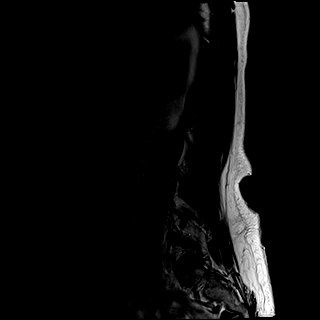
[im 3/12]
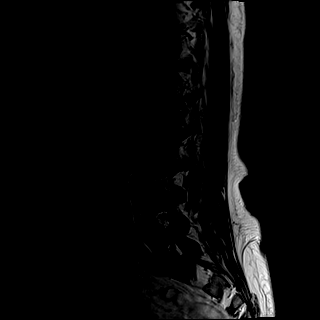
[im 5/12]
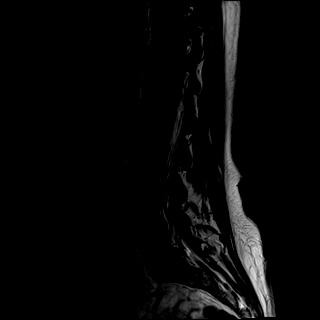
[im 7/12]
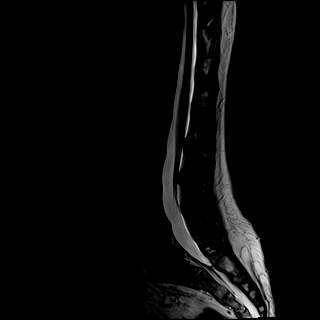
[im 9/12]
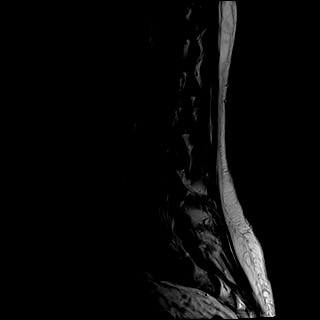
[im 12/12]
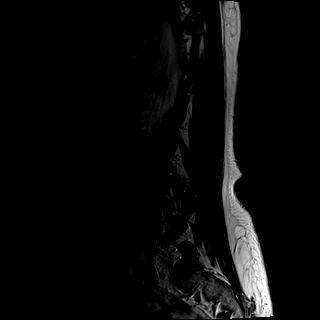

[Series 4: T1 · sagittal · 4.0mm · 0.88mm/px · 5 of 12 slices shown (1 of 2)]
[im 1/12]
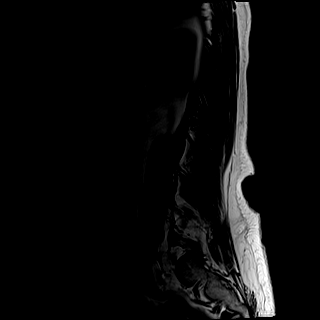
[im 3/12]
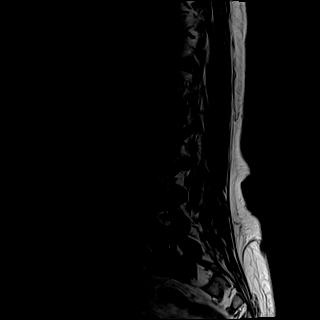
[im 6/12]
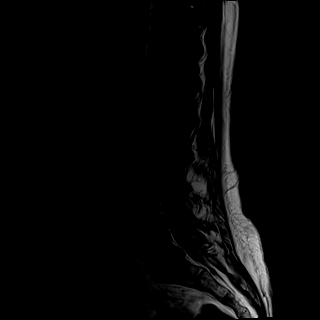
[im 9/12]
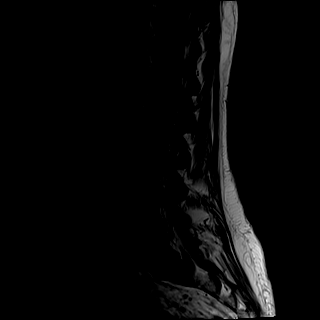
[im 12/12]
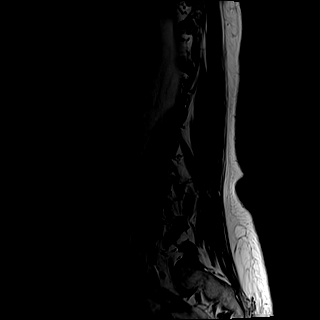

[Series 5: tirm sag · sagittal · 4.0mm · 0.55mm/px · 5 of 12 slices shown]
[im 1/12]
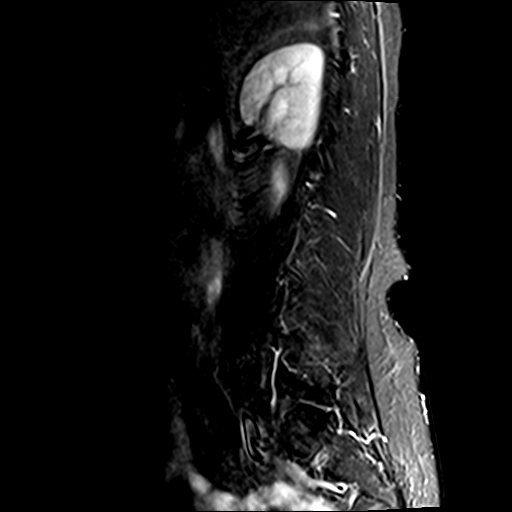
[im 3/12]
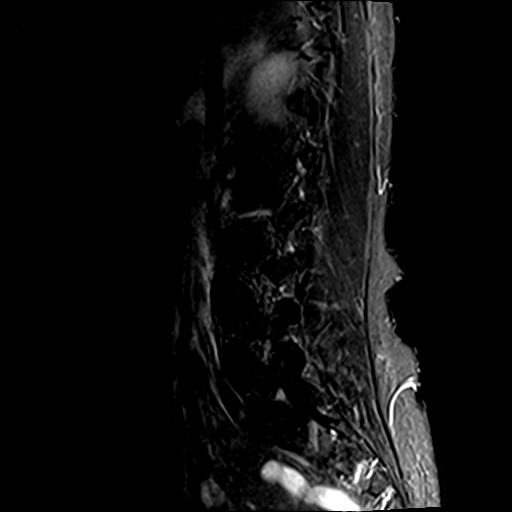
[im 6/12]
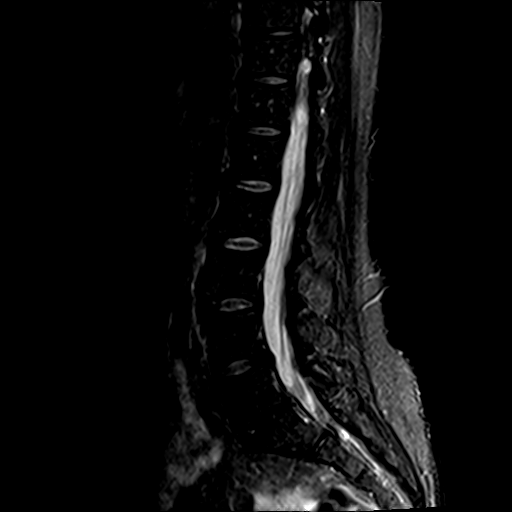
[im 9/12]
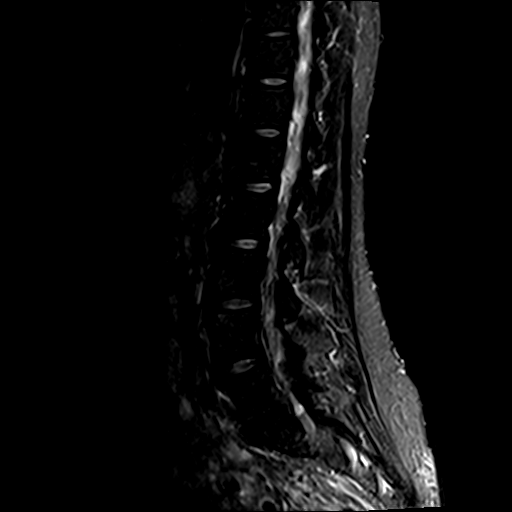
[im 12/12]
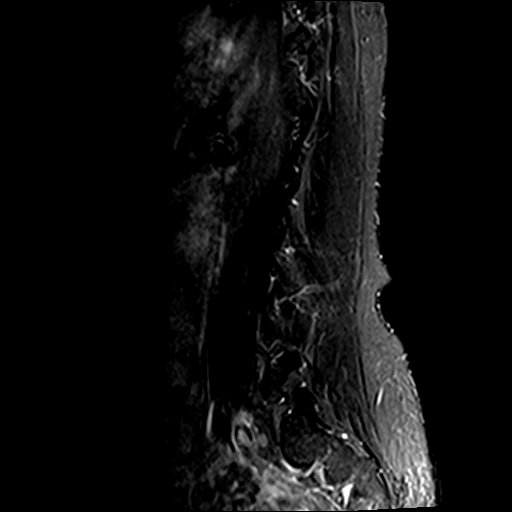

[Series 6: T1 · axial · 4.0mm · 0.78mm/px · z∈[-55,+145]mm · 14 of 36 slices shown (2 of 2)]
[im 1/36]
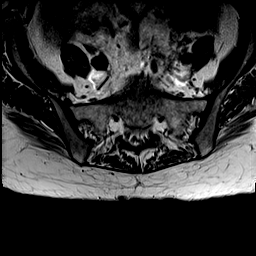
[im 3/36]
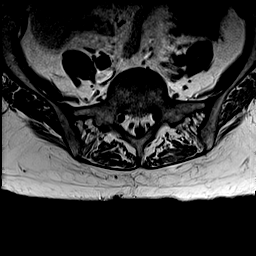
[im 5/36]
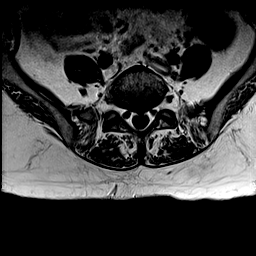
[im 8/36]
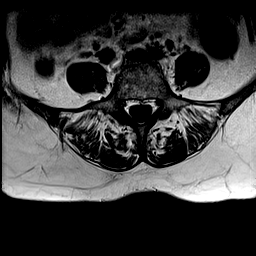
[im 10/36]
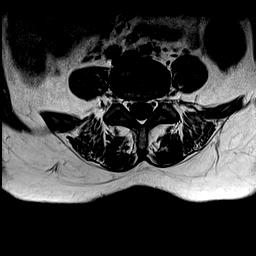
[im 12/36]
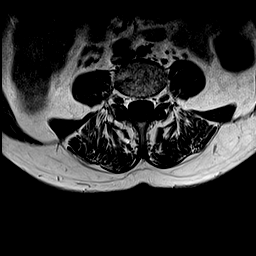
[im 15/36]
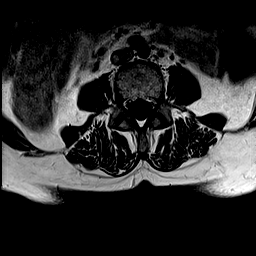
[im 17/36]
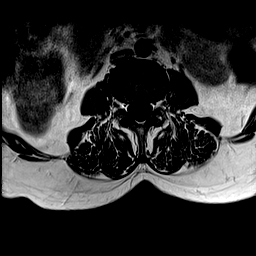
[im 19/36]
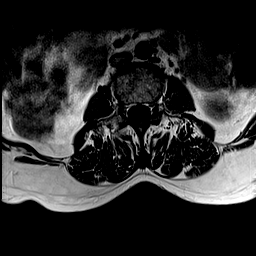
[im 22/36]
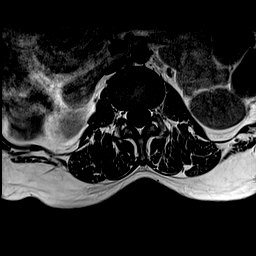
[im 24/36]
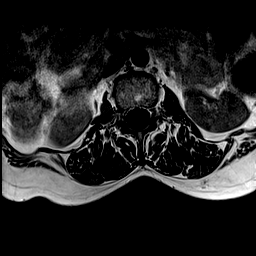
[im 26/36]
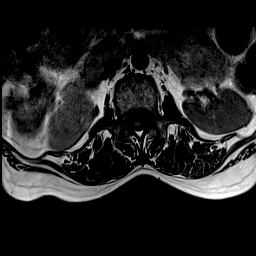
[im 31/36]
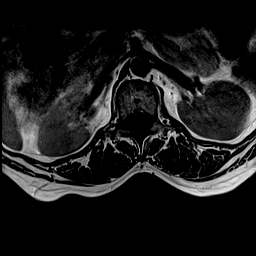
[im 36/36]
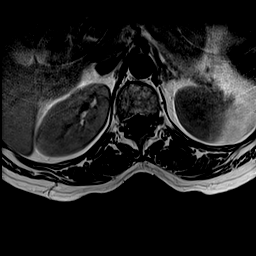

[Series 7: T2 · axial · 4.0mm · 0.78mm/px · z∈[-55,+145]mm · 16 of 36 slices shown]
[im 1/36]
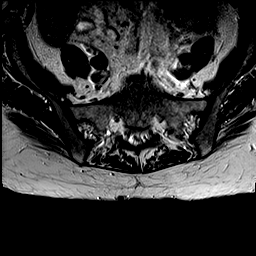
[im 3/36]
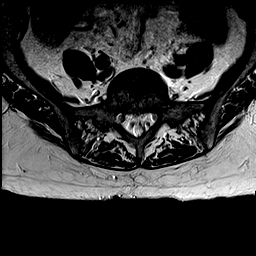
[im 5/36]
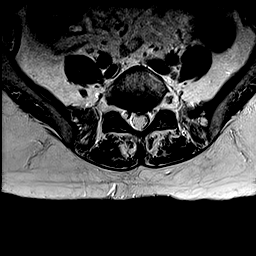
[im 8/36]
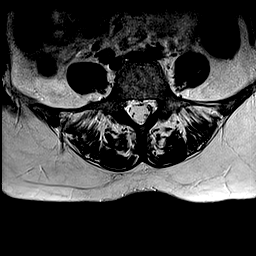
[im 10/36]
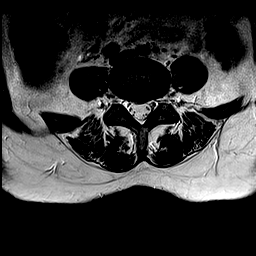
[im 12/36]
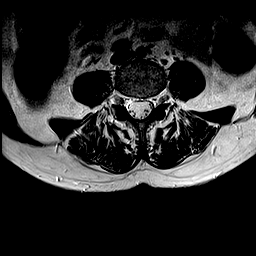
[im 15/36]
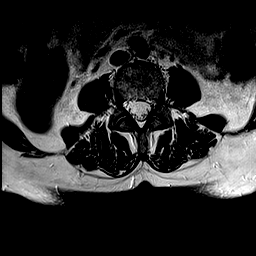
[im 17/36]
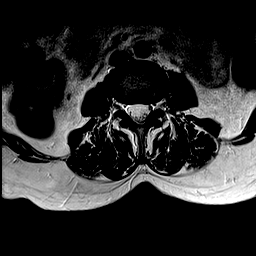
[im 19/36]
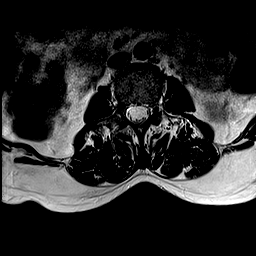
[im 22/36]
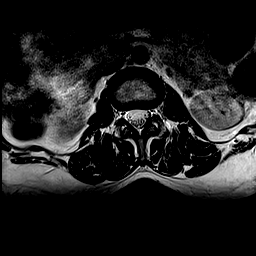
[im 24/36]
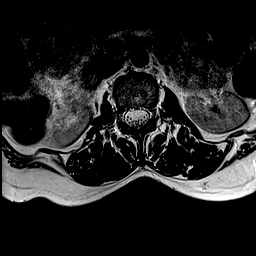
[im 26/36]
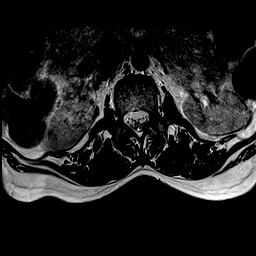
[im 29/36]
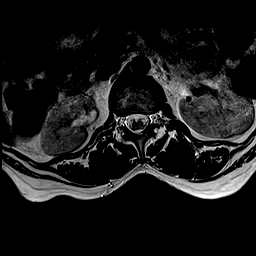
[im 31/36]
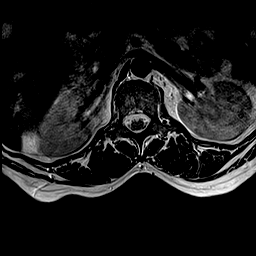
[im 33/36]
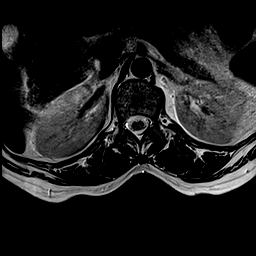
[im 36/36]
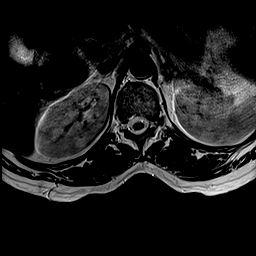

[46 of 48 positions shown; findings below may reference images not displayed]

FINDINGS: Segmentation:  Normal on the comparisons.

Alignment:  Preserved lumbar lordosis with no spondylolisthesis.

Vertebrae: No marrow edema or evidence of acute osseous abnormality.
Visualized bone marrow signal is within normal limits. Intact
visible sacrum and SI joints.

Conus medullaris and cauda equina: Conus extends to the L2 level. No
lower spinal cord or conus signal abnormality.

Paraspinal and other soft tissues: Negative.

Disc levels:

T10-T11: Negative.

T11-T12: Negative.

T12-L1:  Negative.

L1-L2:  Negative.

L2-L3:  Negative.

L3-L4: Minimal disc desiccation and disc bulging. Borderline to mild
facet and ligament flavum hypertrophy. No stenosis.

L4-L5: Mild disc desiccation. Small broad-based central to slightly
right paracentral disc protrusion (series 7, image 26). Disc
material is in proximity to the descending right L5 nerve roots in
the lateral recess, but there is no spinal stenosis or convincing
neural impingement.

L5-S1: Disc space loss with vacuum disc. Mild circumferential disc
bulge and endplate spurring. Small superimposed central disc
protrusion (series 7, image 32). Disc material in proximity to the
descending S1 nerve roots in the lateral recesses. Mild facet
hypertrophy. No spinal stenosis or convincing neural impingement.
IMPRESSION: 1. Lumbar disc degeneration at L4-L5 and L5-S1 with small disc
herniations. These could be a source of L5 and S1 radiculitis,
although there is no associated spinal stenosis or convincing neural
impingement.
2. Minimal lumbar spine degeneration otherwise.

## 2019-02-06 DIAGNOSIS — H25812 Combined forms of age-related cataract, left eye: Secondary | ICD-10-CM | POA: Diagnosis not present

## 2019-02-06 DIAGNOSIS — Z7689 Persons encountering health services in other specified circumstances: Secondary | ICD-10-CM | POA: Diagnosis not present

## 2019-02-06 NOTE — Assessment & Plan Note (Signed)
Chronic, unchanged in nature.  Suspect bowel problems are related to recent colon surgery.  No red flags.  Checked database, patient had a recent oxycodone prescription which was appropriate for her recent surgery.  She suggests that the surgeon will not be prescribing additional oxycodone.  I refilled what has been her previous tramadol prescription.  I also asked her to discuss when she would be appropriate for physical therapy with her surgeon at their visit in 2 weeks.  I would really like her to undergo physical therapy for her back pain.  I think this would be beneficial to her.  She will let me know.

## 2019-02-11 DIAGNOSIS — Z7689 Persons encountering health services in other specified circumstances: Secondary | ICD-10-CM | POA: Diagnosis not present

## 2019-02-11 DIAGNOSIS — F4310 Post-traumatic stress disorder, unspecified: Secondary | ICD-10-CM | POA: Diagnosis not present

## 2019-02-14 DIAGNOSIS — Z7689 Persons encountering health services in other specified circumstances: Secondary | ICD-10-CM | POA: Diagnosis not present

## 2019-02-18 ENCOUNTER — Telehealth: Payer: Self-pay | Admitting: *Deleted

## 2019-02-18 NOTE — Telephone Encounter (Signed)
Pt called because pharmacy was telling her that Dr. Lindell Noe was not registered with Medicaid.  I contacted pharmacy and they did not have the suffix Completed PA info in [place] for Tramadol.  Status pending.  Will recheck status in 24 hours. Danielle Villa, [place], [place]  PA# [id number] W   Pt informed. Danielle Villa, Salome Spotted, CMA

## 2019-02-19 DIAGNOSIS — Z01818 Encounter for other preprocedural examination: Secondary | ICD-10-CM | POA: Diagnosis not present

## 2019-02-19 DIAGNOSIS — Z7689 Persons encountering health services in other specified circumstances: Secondary | ICD-10-CM | POA: Diagnosis not present

## 2019-02-19 DIAGNOSIS — H2511 Age-related nuclear cataract, right eye: Secondary | ICD-10-CM | POA: Diagnosis not present

## 2019-02-19 DIAGNOSIS — H25811 Combined forms of age-related cataract, right eye: Secondary | ICD-10-CM | POA: Diagnosis not present

## 2019-02-19 NOTE — Telephone Encounter (Signed)
PA approved and lm on pharmacy VM informing of approval.  F[place], Salome Spotted, CMA

## 2019-02-21 ENCOUNTER — Telehealth: Payer: Self-pay

## 2019-02-21 DIAGNOSIS — Z1211 Encounter for screening for malignant neoplasm of colon: Secondary | ICD-10-CM

## 2019-02-21 NOTE — Telephone Encounter (Signed)
Patient left message that she spoke with PCP at last appt regarding some blood in her stool. Also mentioned this to her surgeon who did surgery recently. They told her she would need a colonoscopy and to set it up through PCP.  Call back is [phone]   Danley Danker, RN [facility])

## 2019-02-24 ENCOUNTER — Other Ambulatory Visit: Payer: Self-pay | Admitting: Family Medicine

## 2019-02-24 DIAGNOSIS — Z7689 Persons encountering health services in other specified circumstances: Secondary | ICD-10-CM | POA: Diagnosis not present

## 2019-02-24 MED ORDER — TRAMADOL HCL 50 MG PO TABS: 50.0000 mg | ORAL_TABLET | Freq: Four times a day (QID) | ORAL | 0 refills | Status: DC | PRN

## 2019-02-24 NOTE — Telephone Encounter (Signed)
Referral placed, please let the patient know that the GI office should be calling her.

## 2019-02-24 NOTE — Telephone Encounter (Signed)
Received call from patient that [facility] was still unable to process tramadol.  Contacted [facility], they are still having trouble with Dr. Toni Amend DEA even though she has filled tramadol before for this patient. Asked if we could send in under another providers DEA and was told yes.  Dr. Andria Frames sent under his name.  LMOVM for pt to call back.  Please inform when she calls back.  Christen Bame, CMA

## 2019-02-24 NOTE — Telephone Encounter (Signed)
LVM to call office back to inform her of below. Arek Spadafore Zimmerman Rumple, CMA  

## 2019-02-25 DIAGNOSIS — Z7689 Persons encountering health services in other specified circumstances: Secondary | ICD-10-CM | POA: Diagnosis not present

## 2019-02-27 ENCOUNTER — Telehealth: Payer: Self-pay | Admitting: Family Medicine

## 2019-02-27 NOTE — Telephone Encounter (Signed)
Called patient back to discuss appt on Monday. She is scheduled for blood in stool. She had called earlier to get GI referral.  I asked whether she scheduled this appointment because she thought she needed to get the referral.  She said yes.  The referral was placed, but she didn't get the message. She states it is blood on the toilet paper and sometimes around the stool. She does not have pain with this. She feels well today. I recommended that she wait for GI to call her for an appt, that we are trying to limit visits into the office if patients may be able to be helped over the phone.  She feels okay to wait on the GI doctor to call her.  I have asked if she feels like this is worsening or has persistent concerns to call me.  I will be happy to see her on Monday if she feels like this is still a worry for her.  I will leave her on the schedule for now.  Ralene Ok, MD

## 2019-03-03 ENCOUNTER — Telehealth (INDEPENDENT_AMBULATORY_CARE_PROVIDER_SITE_OTHER): Payer: Medicaid Other | Admitting: Family Medicine

## 2019-03-03 ENCOUNTER — Ambulatory Visit: Payer: Medicaid Other | Admitting: Family Medicine

## 2019-03-03 DIAGNOSIS — K921 Melena: Secondary | ICD-10-CM

## 2019-03-03 NOTE — Telephone Encounter (Signed)
[facility] Visit  Patient consented to have visit conducted via telephone.  Encounter participants: Patient: Danielle Villa  Provider: Ralene Ok  Others (if applicable): none  Chief Complaint: blood in stool   HPI:  She reports blood in and around her stool x 1 month. She notices this when she wipes as well. She can't recall if Dr. Kae Heller noted hemorrhoids or not. She doesn't feel hemorrhoids and doesn't think this anymore. No pain with her BMs. This occurs every time she has a bowel movement. She denies more SOB than normal. Eating well. Dr. Kae Heller told her she wasn't worried about her anastomosis. Now having BMs about 5 times per day. Not taking any miralax or other laxatives. No belly pain at all.   ROS: Denies CP, SOB, abd pain, dysuria, +frequency of BMs.   Pertinent PMHx: S/p recent colectomy with anastomosis, OA, mood disorder  Assessment/Plan:  Blood in stool: Patient denies abdominal pain or change in the blood which sounds to be around her stool.  She does not have any rectal pain or persistent bleeding.  Denies shortness of breath or cold intolerance.  Low likelihood of acute anemia.  I checked on her GI referral, which has been placed.  They may not be calling due to quarantine.  She will stay at home for the next few weeks and call me back in 2 weeks to let me know how she is doing.  She will call if any worsening or notice of signs of anemia such as shortness of breath or cold intolerance.  Time spent on phone with patient: 11 minutes  Ralene Ok, MD

## 2019-03-03 NOTE — Telephone Encounter (Signed)
Agree with below.   Carina Brown, MD  Family Medicine Teaching Service   

## 2019-04-08 ENCOUNTER — Other Ambulatory Visit: Payer: Self-pay

## 2019-04-08 ENCOUNTER — Telehealth: Payer: Medicaid Other | Admitting: Family Medicine

## 2019-04-08 NOTE — Progress Notes (Signed)
Attempted to contact the patient via listed contact information both over [facility] and [email or web address].  Also try to telephone call and voicemail was not set up.  If she calls back may offer another virtual visit at another time.  We will no charge this visit.

## 2019-04-17 ENCOUNTER — Other Ambulatory Visit: Payer: Self-pay

## 2019-04-17 DIAGNOSIS — F4310 Post-traumatic stress disorder, unspecified: Secondary | ICD-10-CM

## 2019-04-17 DIAGNOSIS — F3341 Major depressive disorder, recurrent, in partial remission: Secondary | ICD-10-CM

## 2019-04-17 DIAGNOSIS — F410 Panic disorder [episodic paroxysmal anxiety] without agoraphobia: Secondary | ICD-10-CM

## 2019-04-17 NOTE — Telephone Encounter (Signed)
Called pt. Made in person appt for 5/13  With Dr. Lindell Noe. Pt asked if she can get a refill of her tramadol. Still having a lot of body pain. Ottis Stain, CMA

## 2019-04-18 MED ORDER — TRAMADOL HCL 50 MG PO TABS: 50.0000 mg | ORAL_TABLET | Freq: Four times a day (QID) | ORAL | 0 refills | Status: DC | PRN

## 2019-04-21 MED ORDER — PAROXETINE HCL 20 MG PO TABS: 20.0000 mg | ORAL_TABLET | Freq: Every day | ORAL | 0 refills | Status: DC

## 2019-04-21 NOTE — Telephone Encounter (Signed)
Pt calls nurse line requesting a refill on Paxil. Please advise.

## 2019-04-21 NOTE — Addendum Note (Signed)
Addended by: Dorna Bloom on: 04/21/2019 10:53 AM   Modules accepted: Orders

## 2019-04-23 ENCOUNTER — Ambulatory Visit (INDEPENDENT_AMBULATORY_CARE_PROVIDER_SITE_OTHER): Payer: Medicaid Other | Admitting: Family Medicine

## 2019-04-23 ENCOUNTER — Encounter: Payer: Self-pay | Admitting: Family Medicine

## 2019-04-23 ENCOUNTER — Other Ambulatory Visit: Payer: Self-pay

## 2019-04-23 VITALS — BP 120/76 | HR 76

## 2019-04-23 DIAGNOSIS — Z7689 Persons encountering health services in other specified circumstances: Secondary | ICD-10-CM | POA: Diagnosis not present

## 2019-04-23 DIAGNOSIS — K921 Melena: Secondary | ICD-10-CM

## 2019-04-23 DIAGNOSIS — F4310 Post-traumatic stress disorder, unspecified: Secondary | ICD-10-CM

## 2019-04-23 MED ORDER — FERROUS SULFATE 325 (65 FE) MG PO TABS: 325.0000 mg | ORAL_TABLET | Freq: Every day | ORAL | 3 refills | Status: AC

## 2019-04-23 MED ORDER — TRAZODONE HCL 50 MG PO TABS: 50.0000 mg | ORAL_TABLET | Freq: Every evening | ORAL | 0 refills | Status: AC | PRN

## 2019-04-23 NOTE — Patient Instructions (Addendum)
Call Dr. Marguerite Olea office about getting a follow up appointment. I sent a trazodone refill until you are able to see him. [phone]  For the blood in your bowel movements, we will check your blood numbers today. Call me if things get worse or go to the ER. Take the iron pills I sent.

## 2019-04-23 NOTE — Progress Notes (Signed)
   CC: blood in bowel movements   HPI  She frequently wakes up with her shoulder muscles hurting. Her back has been hurting for years. Sometimes her thighs hurt. She takes advil PRN for this pain.   Blood in bowel movements - now is about 3 times per week. When this happens, it is bright red. No pain. She can see it around the BM and in the toilet bowl. She is not taking iron right now.   ROS: Denies CP, SOB, abdominal pain, dysuria, changes in BMs.   CC, SH/smoking status, and VS noted  Objective: BP 120/76   Pulse 76   SpO2 98%  Gen: NAD, alert, cooperative, and pleasant. HEENT: NCAT, EOMI, PERRL CV: RRR, no murmur Resp: CTAB, no wheezes, non-labored Abd: SNTND, +BS  Ext: No edema, warm Neuro: Alert and oriented, Speech clear, No gross deficits  Assessment and plan:  Intermittent MSK pain: ok to use APAP, ok to use the occasional ibuprofen with food and not daily. She wondered about fibromyalgia. She has tried SNRI in the past.   Blood in BMs: likely intermittently bleeding diverticula. She is not having any pain at present. I wonder when she would be able to have a colonoscopy to assess any further lesions. For now, we will check Hgb. If drops, we will pursue further testing. She should reach out to her surgeon or our office if this becomes more or constant.    Ralene Ok, MD, PGY3 04/23/2019 3:30 PM

## 2019-04-24 ENCOUNTER — Telehealth: Payer: Self-pay | Admitting: *Deleted

## 2019-04-24 LAB — CBC
Hematocrit: 38.1 % (ref 34.0–46.6)
Hemoglobin: 12.6 g/dL (ref 11.1–15.9)
MCH: 27.5 pg (ref 26.6–33.0)
MCHC: 33.1 g/dL (ref 31.5–35.7)
MCV: 83 fL (ref 79–97)
Platelets: 315 10*3/uL (ref 150–450)
RBC: 4.59 x10E6/uL (ref 3.77–5.28)
RDW: 14.4 % (ref 11.7–15.4)
WBC: 5.8 10*3/uL (ref 3.4–10.8)

## 2019-04-24 NOTE — Telephone Encounter (Signed)
Pt is returning Danielle Villa's phone call and would like someone to call her back again with her results.

## 2019-04-24 NOTE — Telephone Encounter (Signed)
-----   Message from Sela Hilding, MD sent at 04/24/2019  9:55 AM EDT ----- [facility] team, please let pt know that her blood count looks EXCELLENT considering where it was 3 months ago. This is a good sign. She should call me if she has lots more bleeding from her bottom.

## 2019-04-24 NOTE — Telephone Encounter (Signed)
LVm for pt to call office back to inform her of below. April Zimmerman Rumple, CMA

## 2019-04-24 NOTE — Telephone Encounter (Signed)
Pt informed of below.Danielle Villa, CMA ? ?

## 2019-04-30 ENCOUNTER — Telehealth: Payer: Self-pay | Admitting: Family Medicine

## 2019-04-30 NOTE — Telephone Encounter (Signed)
LVM to call office back.  Wanted to let pt know that the results from her last tests we had already given those to her and that I did not see any other messages referencing anything else.  I can route to PCP to see if by chance she has tried to reach out for anything else. April Zimmerman Rumple, CMA

## 2019-04-30 NOTE — Telephone Encounter (Signed)
Pt is returning the doctor's call , she thinks that it could be test results. jw

## 2019-05-02 DIAGNOSIS — Z7689 Persons encountering health services in other specified circumstances: Secondary | ICD-10-CM | POA: Diagnosis not present

## 2019-05-02 DIAGNOSIS — F4310 Post-traumatic stress disorder, unspecified: Secondary | ICD-10-CM | POA: Diagnosis not present

## 2019-05-02 NOTE — Telephone Encounter (Signed)
I did not try to call.

## 2019-05-07 DIAGNOSIS — H2512 Age-related nuclear cataract, left eye: Secondary | ICD-10-CM | POA: Diagnosis not present

## 2019-05-07 DIAGNOSIS — H25812 Combined forms of age-related cataract, left eye: Secondary | ICD-10-CM | POA: Diagnosis not present

## 2019-05-07 DIAGNOSIS — Z01818 Encounter for other preprocedural examination: Secondary | ICD-10-CM | POA: Diagnosis not present

## 2019-05-20 ENCOUNTER — Other Ambulatory Visit: Payer: Self-pay | Admitting: Family Medicine

## 2019-05-20 DIAGNOSIS — F4310 Post-traumatic stress disorder, unspecified: Secondary | ICD-10-CM

## 2019-05-20 DIAGNOSIS — Z7689 Persons encountering health services in other specified circumstances: Secondary | ICD-10-CM | POA: Diagnosis not present

## 2019-05-21 NOTE — Telephone Encounter (Signed)
Pt informed of below and she will be contacting her pharmacy to see about this. Maelynn Moroney Zimmerman Rumple, CMA

## 2019-05-21 NOTE — Telephone Encounter (Signed)
[facility] team, can you clarify with her? I sent #[phone] on 04/23/2019, so if she is taking 50mg  QHS, even if she was taking every night, she should still have 2 months worth left. Please ask her if she requested this refill and how many she is taking per day.

## 2019-06-04 DIAGNOSIS — F4310 Post-traumatic stress disorder, unspecified: Secondary | ICD-10-CM | POA: Diagnosis not present

## 2019-06-04 DIAGNOSIS — Z7689 Persons encountering health services in other specified circumstances: Secondary | ICD-10-CM | POA: Diagnosis not present

## 2019-06-09 DIAGNOSIS — Z7689 Persons encountering health services in other specified circumstances: Secondary | ICD-10-CM | POA: Diagnosis not present

## 2019-06-19 DIAGNOSIS — Z7689 Persons encountering health services in other specified circumstances: Secondary | ICD-10-CM | POA: Diagnosis not present

## 2019-07-25 DIAGNOSIS — M5137 Other intervertebral disc degeneration, lumbosacral region: Secondary | ICD-10-CM | POA: Diagnosis not present

## 2019-07-25 DIAGNOSIS — M9901 Segmental and somatic dysfunction of cervical region: Secondary | ICD-10-CM | POA: Diagnosis not present

## 2019-07-25 DIAGNOSIS — S134XXA Sprain of ligaments of cervical spine, initial encounter: Secondary | ICD-10-CM | POA: Diagnosis not present

## 2019-07-25 DIAGNOSIS — M9903 Segmental and somatic dysfunction of lumbar region: Secondary | ICD-10-CM | POA: Diagnosis not present

## 2019-07-25 DIAGNOSIS — M9902 Segmental and somatic dysfunction of thoracic region: Secondary | ICD-10-CM | POA: Diagnosis not present

## 2019-07-25 DIAGNOSIS — M5414 Radiculopathy, thoracic region: Secondary | ICD-10-CM | POA: Diagnosis not present

## 2019-08-01 DIAGNOSIS — M9902 Segmental and somatic dysfunction of thoracic region: Secondary | ICD-10-CM | POA: Diagnosis not present

## 2019-08-01 DIAGNOSIS — S134XXA Sprain of ligaments of cervical spine, initial encounter: Secondary | ICD-10-CM | POA: Diagnosis not present

## 2019-08-01 DIAGNOSIS — M9901 Segmental and somatic dysfunction of cervical region: Secondary | ICD-10-CM | POA: Diagnosis not present

## 2019-08-01 DIAGNOSIS — M5414 Radiculopathy, thoracic region: Secondary | ICD-10-CM | POA: Diagnosis not present

## 2019-08-01 DIAGNOSIS — M5137 Other intervertebral disc degeneration, lumbosacral region: Secondary | ICD-10-CM | POA: Diagnosis not present

## 2019-08-01 DIAGNOSIS — M9903 Segmental and somatic dysfunction of lumbar region: Secondary | ICD-10-CM | POA: Diagnosis not present

## 2019-08-04 DIAGNOSIS — F4310 Post-traumatic stress disorder, unspecified: Secondary | ICD-10-CM | POA: Diagnosis not present

## 2019-08-15 DIAGNOSIS — M9902 Segmental and somatic dysfunction of thoracic region: Secondary | ICD-10-CM | POA: Diagnosis not present

## 2019-08-15 DIAGNOSIS — M9901 Segmental and somatic dysfunction of cervical region: Secondary | ICD-10-CM | POA: Diagnosis not present

## 2019-08-15 DIAGNOSIS — S134XXA Sprain of ligaments of cervical spine, initial encounter: Secondary | ICD-10-CM | POA: Diagnosis not present

## 2019-08-15 DIAGNOSIS — M9903 Segmental and somatic dysfunction of lumbar region: Secondary | ICD-10-CM | POA: Diagnosis not present

## 2019-08-15 DIAGNOSIS — M5137 Other intervertebral disc degeneration, lumbosacral region: Secondary | ICD-10-CM | POA: Diagnosis not present

## 2019-08-15 DIAGNOSIS — M5414 Radiculopathy, thoracic region: Secondary | ICD-10-CM | POA: Diagnosis not present

## 2019-09-01 DIAGNOSIS — F4310 Post-traumatic stress disorder, unspecified: Secondary | ICD-10-CM | POA: Diagnosis not present

## 2019-09-05 DIAGNOSIS — M5414 Radiculopathy, thoracic region: Secondary | ICD-10-CM | POA: Diagnosis not present

## 2019-09-05 DIAGNOSIS — S134XXA Sprain of ligaments of cervical spine, initial encounter: Secondary | ICD-10-CM | POA: Diagnosis not present

## 2019-09-05 DIAGNOSIS — M5137 Other intervertebral disc degeneration, lumbosacral region: Secondary | ICD-10-CM | POA: Diagnosis not present

## 2019-09-05 DIAGNOSIS — M9903 Segmental and somatic dysfunction of lumbar region: Secondary | ICD-10-CM | POA: Diagnosis not present

## 2019-09-05 DIAGNOSIS — M9901 Segmental and somatic dysfunction of cervical region: Secondary | ICD-10-CM | POA: Diagnosis not present

## 2019-09-05 DIAGNOSIS — M9902 Segmental and somatic dysfunction of thoracic region: Secondary | ICD-10-CM | POA: Diagnosis not present

## 2019-09-08 NOTE — Progress Notes (Deleted)
     Subjective: No chief complaint on file.    HPI: Danielle Villa is a 58 y.o. presenting to clinic today to discuss the following:  1 Increased Urination  Pain urinating started *** days ago. Pain is: *** Medications tried: *** Any antibiotics in the last 30 days: *** More than 3 UTIs in the last 12 months: *** STD exposure: *** Possibly pregnant: ***  Symptoms Urgency: *** Frequency: *** Blood in urine: *** Pain in back:*** Fever: *** Vaginal discharge: *** Mouth Ulcers: ***  Review of Symptoms - see HPI PMH - Smoking status noted.      2 STD Testing Patient request STD testing.  Denies changes in vaginal discharge***.  Denies vaginal ulcerations or other concerns.  Notes *** sexual partner*** at present.  Menopause***.  Using protection ***.   Health Maintenance: Needs Colonoscopy ***     ROS noted in HPI. Chief complaint noted.  Other Pertinent PMH: *** Past Medical, Surgical, Social, and Family History Reviewed & Updated per EMR.      Social History   Tobacco Use  Smoking Status Former Smoker  Smokeless Tobacco Never Used   Smoking status noted.    Objective: There were no vitals taken for this visit. Vitals and nursing notes reviewed  Physical Exam: *** General: 58 y.o. female in NAD Cardio: RRR no m/r/g Lungs: CTAB, no wheezing, no rhonchi, no crackles, no IWOB on *** Abdomen: Soft, non-tender to palpation, non-distended, positive bowel sounds Skin: warm and dry Extremities: No edema   No results found for this or any previous visit (from the past 72 hour(s)).  Assessment/Plan:  No problem-specific Assessment & Plan notes found for this encounter.     PATIENT EDUCATION PROVIDED: See AVS    Diagnosis and plan along with any newly prescribed medication(s) were discussed in detail with this patient today. The patient verbalized understanding and agreed with the plan. Patient advised if symptoms worsen return to clinic or ER.    Health Maintainance:   No orders of the defined types were placed in this encounter.   No orders of the defined types were placed in this encounter.    [place] Constable, DO 09/08/2019, 3:09 PM PGY-2 [facility]

## 2019-09-09 ENCOUNTER — Ambulatory Visit: Payer: Medicaid Other | Admitting: Family Medicine

## 2019-09-10 ENCOUNTER — Ambulatory Visit (INDEPENDENT_AMBULATORY_CARE_PROVIDER_SITE_OTHER): Payer: Medicaid Other | Admitting: Family Medicine

## 2019-09-10 ENCOUNTER — Other Ambulatory Visit: Payer: Self-pay

## 2019-09-10 ENCOUNTER — Other Ambulatory Visit (HOSPITAL_COMMUNITY)
Admission: RE | Admit: 2019-09-10 | Discharge: 2019-09-10 | Disposition: A | Discharge status: Home / Self Care | Payer: Medicaid Other | Source: Ambulatory Visit | Attending: Family Medicine | Admitting: Family Medicine

## 2019-09-10 ENCOUNTER — Encounter: Payer: Self-pay | Admitting: Family Medicine

## 2019-09-10 VITALS — BP 132/72 | HR 87

## 2019-09-10 DIAGNOSIS — Z113 Encounter for screening for infections with a predominantly sexual mode of transmission: Secondary | ICD-10-CM | POA: Diagnosis not present

## 2019-09-10 DIAGNOSIS — R32 Unspecified urinary incontinence: Secondary | ICD-10-CM | POA: Insufficient documentation

## 2019-09-10 DIAGNOSIS — R350 Frequency of micturition: Secondary | ICD-10-CM | POA: Diagnosis not present

## 2019-09-10 DIAGNOSIS — N3946 Mixed incontinence: Secondary | ICD-10-CM | POA: Diagnosis not present

## 2019-09-10 LAB — POCT URINALYSIS DIP (MANUAL ENTRY)
Bilirubin, UA: NEGATIVE
Glucose, UA: NEGATIVE mg/dL
Ketones, POC UA: NEGATIVE mg/dL
Leukocytes, UA: NEGATIVE
Nitrite, UA: NEGATIVE
Spec Grav, UA: >=1.03 — AB (ref 1.010–1.025)
Urobilinogen, UA: 0.2 E.U./dL
pH, UA: 5 (ref 5.0–8.0)

## 2019-09-10 LAB — POCT WET PREP (WET MOUNT)
Clue Cells Wet Prep Whiff POC: NEGATIVE
Trichomonas Wet Prep HPF POC: ABSENT
WBC, Wet Prep HPF POC: >20

## 2019-09-10 NOTE — Assessment & Plan Note (Signed)
Plan: -Screen for GC/chlamydia, HIV, syphilis -Wet mount performed

## 2019-09-10 NOTE — Progress Notes (Signed)
   Subjective:    Patient ID: Danielle Villa, female    DOB: 06-30-1961, 58 y.o.   MRN: [id number]   CC: Urinary frequency  HPI:   STD screening: Patient is a very pleasant 58 year old female who presents today to discuss urinary frequency as well as to get STD screening.  Patient states that she has not had sexual intercourse for several years but has recently met a new partner and so wishes to have STD screening as precaution.  Urinary frequency: Patient states that for 6 or 7 years she has had an increase in urinary frequency.  She states that she feels as though she needs to go about every hour even at night.  She says this causes her to wake up from sleep every hour or so to urinate.  She also admits to some occasional loss of urine during coughing or sneezing.  She denies dysuria and states that there is nothing that seems to improve or worsen her symptoms.  She states she has had 4 vaginal deliveries.  ROS: pertinent noted in the HPI   Pertinent PMH, PSH, FH, SoHx: 4 vaginal deliveries   Objective:  BP 132/72   Pulse 87   SpO2 93%   Vitals and nursing note reviewed  General: NAD, pleasant, able to participate in exam Cardiac: RRR Respiratory: CTAB Pelvic exam: normal external genitalia, vulva, vagina, cervix, uterus.  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 Plan: -Recommend referral to urology to further evaluate patient for urge and or stress incontinence -Patient to attempt Keagle exercises between now and her urology appointment.  Screening examination for STD (sexually transmitted disease) Plan: -Screen for GC/chlamydia, HIV, syphilis -Wet mount performed -Urinalysis performed-no signs of infection -Hepatitis C screening performed  Lurline Del, [facility] PGY-1

## 2019-09-10 NOTE — Assessment & Plan Note (Signed)
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 Plan: -Recommend referral to urology to further evaluate patient for urge and or stress incontinence -Patient to attempt Keagle exercises between now and her urology appointment.

## 2019-09-10 NOTE — Patient Instructions (Addendum)
It was great to see you!  Our plans for today:  - We are checking a few labs and I will call you with the results - I am referring you to urology to further work up your urinary frequency - I recommend trying kegal exercises to help with the losing urine during coughing/sneezing - I have attached some of these below  We are checking some labs today, we will call you or send you a letter if they are abnormal.   Take care and seek immediate care sooner if you develop any concerns.   Dr. [facility]    Kegel Exercises  Kegel exercises can help strengthen your pelvic floor muscles. The pelvic floor is a group of muscles that support your rectum, small intestine, and bladder. In females, pelvic floor muscles also help support the womb (uterus). These muscles help you control the flow of urine and stool. Kegel exercises are painless and simple, and they do not require any equipment. Your provider may suggest Kegel exercises to:  Improve bladder and bowel control.  Improve sexual response.  Improve weak pelvic floor muscles after surgery to remove the uterus (hysterectomy) or pregnancy (females).  Improve weak pelvic floor muscles after prostate gland removal or surgery (males). Kegel exercises involve squeezing your pelvic floor muscles, which are the same muscles you squeeze when you try to stop the flow of urine or keep from passing gas. The exercises can be done while sitting, standing, or lying down, but it is best to vary your position. Exercises How to do Kegel exercises: 1. Squeeze your pelvic floor muscles tight. You should feel a tight lift in your rectal area. If you are a female, you should also feel a tightness in your vaginal area. Keep your stomach, buttocks, and legs relaxed. 2. Hold the muscles tight for up to 10 seconds. 3. Breathe normally. 4. Relax your muscles. 5. Repeat as told by your health care provider. Repeat this exercise daily as told by your  health care provider. Continue to do this exercise for at least 4-6 weeks, or for as long as told by your health care provider. You may be referred to a physical therapist who can help you learn more about how to do Kegel exercises. Depending on your condition, your health care provider may recommend:  Varying how long you squeeze your muscles.  Doing several sets of exercises every day.  Doing exercises for several weeks.  Making Kegel exercises a part of your regular exercise routine. This information is not intended to replace advice given to you by your health care provider. Make sure you discuss any questions you have with your health care provider. Document Released: 11/13/2012 Document Revised: 07/17/2018 Document Reviewed: 07/17/2018 Elsevier Patient Education  2020 [employer].

## 2019-09-11 LAB — CERVICOVAGINAL ANCILLARY ONLY
Chlamydia: NEGATIVE
Molecular Disclaimer: NEGATIVE
Molecular Disclaimer: NORMAL
Neisseria Gonorrhea: NEGATIVE

## 2019-09-11 LAB — HEMOGLOBIN A1C
Est. average glucose Bld gHb Est-mCnc: 114 mg/dL
Hgb A1c MFr Bld: 5.6 % (ref 4.8–5.6)

## 2019-09-11 LAB — HIV ANTIBODY (ROUTINE TESTING W REFLEX): HIV Screen 4th Generation wRfx: NONREACTIVE

## 2019-09-11 LAB — RPR: RPR Ser Ql: NONREACTIVE

## 2019-09-11 LAB — HEPATITIS C ANTIBODY (REFLEX): HCV Ab: <0.1 s/co ratio (ref 0.0–0.9)

## 2019-09-11 LAB — HCV COMMENT:

## 2019-09-12 ENCOUNTER — Encounter: Payer: Self-pay | Admitting: Family Medicine

## 2019-09-12 LAB — URINE CULTURE

## 2019-09-19 DIAGNOSIS — M9901 Segmental and somatic dysfunction of cervical region: Secondary | ICD-10-CM | POA: Diagnosis not present

## 2019-09-19 DIAGNOSIS — M5414 Radiculopathy, thoracic region: Secondary | ICD-10-CM | POA: Diagnosis not present

## 2019-09-19 DIAGNOSIS — S134XXA Sprain of ligaments of cervical spine, initial encounter: Secondary | ICD-10-CM | POA: Diagnosis not present

## 2019-09-19 DIAGNOSIS — M5137 Other intervertebral disc degeneration, lumbosacral region: Secondary | ICD-10-CM | POA: Diagnosis not present

## 2019-09-19 DIAGNOSIS — M9902 Segmental and somatic dysfunction of thoracic region: Secondary | ICD-10-CM | POA: Diagnosis not present

## 2019-09-19 DIAGNOSIS — M9903 Segmental and somatic dysfunction of lumbar region: Secondary | ICD-10-CM | POA: Diagnosis not present

## 2019-10-03 DIAGNOSIS — M5414 Radiculopathy, thoracic region: Secondary | ICD-10-CM | POA: Diagnosis not present

## 2019-10-03 DIAGNOSIS — S134XXA Sprain of ligaments of cervical spine, initial encounter: Secondary | ICD-10-CM | POA: Diagnosis not present

## 2019-10-03 DIAGNOSIS — M5137 Other intervertebral disc degeneration, lumbosacral region: Secondary | ICD-10-CM | POA: Diagnosis not present

## 2019-10-03 DIAGNOSIS — M9902 Segmental and somatic dysfunction of thoracic region: Secondary | ICD-10-CM | POA: Diagnosis not present

## 2019-10-03 DIAGNOSIS — M9903 Segmental and somatic dysfunction of lumbar region: Secondary | ICD-10-CM | POA: Diagnosis not present

## 2019-10-03 DIAGNOSIS — M9901 Segmental and somatic dysfunction of cervical region: Secondary | ICD-10-CM | POA: Diagnosis not present

## 2019-10-10 DIAGNOSIS — F4310 Post-traumatic stress disorder, unspecified: Secondary | ICD-10-CM | POA: Diagnosis not present

## 2019-10-26 ENCOUNTER — Emergency Department (HOSPITAL_COMMUNITY): Payer: Medicaid Other

## 2019-10-26 ENCOUNTER — Emergency Department (HOSPITAL_COMMUNITY)
Admission: EM | Admit: 2019-10-26 | Discharge: 2019-10-27 | Disposition: A | Discharge status: Home / Self Care | Payer: Medicaid Other | Attending: Emergency Medicine | Admitting: Emergency Medicine

## 2019-10-26 ENCOUNTER — Encounter (HOSPITAL_COMMUNITY): Payer: Self-pay | Admitting: Emergency Medicine

## 2019-10-26 ENCOUNTER — Other Ambulatory Visit: Payer: Self-pay

## 2019-10-26 DIAGNOSIS — Z7982 Long term (current) use of aspirin: Secondary | ICD-10-CM | POA: Insufficient documentation

## 2019-10-26 DIAGNOSIS — K529 Noninfective gastroenteritis and colitis, unspecified: Secondary | ICD-10-CM | POA: Diagnosis not present

## 2019-10-26 DIAGNOSIS — R0789 Other chest pain: Secondary | ICD-10-CM | POA: Diagnosis not present

## 2019-10-26 DIAGNOSIS — Z20828 Contact with and (suspected) exposure to other viral communicable diseases: Secondary | ICD-10-CM | POA: Diagnosis not present

## 2019-10-26 DIAGNOSIS — Z79899 Other long term (current) drug therapy: Secondary | ICD-10-CM | POA: Insufficient documentation

## 2019-10-26 DIAGNOSIS — K219 Gastro-esophageal reflux disease without esophagitis: Secondary | ICD-10-CM | POA: Diagnosis not present

## 2019-10-26 DIAGNOSIS — Z87891 Personal history of nicotine dependence: Secondary | ICD-10-CM | POA: Insufficient documentation

## 2019-10-26 DIAGNOSIS — R079 Chest pain, unspecified: Secondary | ICD-10-CM | POA: Diagnosis not present

## 2019-10-26 DIAGNOSIS — R109 Unspecified abdominal pain: Secondary | ICD-10-CM | POA: Diagnosis not present

## 2019-10-26 DIAGNOSIS — R509 Fever, unspecified: Secondary | ICD-10-CM | POA: Diagnosis not present

## 2019-10-26 DIAGNOSIS — R0602 Shortness of breath: Secondary | ICD-10-CM | POA: Diagnosis not present

## 2019-10-26 LAB — COMPREHENSIVE METABOLIC PANEL
ALT: 19 U/L (ref 0–44)
AST: 24 U/L (ref 15–41)
Albumin: 4.6 g/dL (ref 3.5–5.0)
Alkaline Phosphatase: 81 U/L (ref 38–126)
Anion gap: 14 (ref 5–15)
BUN: 17 mg/dL (ref 6–20)
CO2: 23 mmol/L (ref 22–32)
Calcium: 9.6 mg/dL (ref 8.9–10.3)
Chloride: 102 mmol/L (ref 98–111)
Creatinine, Ser: 0.66 mg/dL (ref 0.44–1.00)
GFR calc Af Amer: >60 mL/min (ref >60)
GFR calc non Af Amer: >60 mL/min (ref >60)
Glucose, Bld: 146 mg/dL — ABNORMAL HIGH (ref 70–99)
Potassium: 3.5 mmol/L (ref 3.5–5.1)
Sodium: 139 mmol/L (ref 135–145)
Total Bilirubin: 0.6 mg/dL (ref 0.3–1.2)
Total Protein: 8.3 g/dL — ABNORMAL HIGH (ref 6.5–8.1)

## 2019-10-26 LAB — CBC
HCT: 46.6 % — ABNORMAL HIGH (ref 36.0–46.0)
Hemoglobin: 15.2 g/dL — ABNORMAL HIGH (ref 12.0–15.0)
MCH: 31.1 pg (ref 26.0–34.0)
MCHC: 32.6 g/dL (ref 30.0–36.0)
MCV: 95.3 fL (ref 80.0–100.0)
Platelets: 272 10*3/uL (ref 150–400)
RBC: 4.89 MIL/uL (ref 3.87–5.11)
RDW: 14.4 % (ref 11.5–15.5)
WBC: 13.5 10*3/uL — ABNORMAL HIGH (ref 4.0–10.5)
nRBC: 0 % (ref 0.0–0.2)

## 2019-10-26 LAB — D-DIMER, QUANTITATIVE: D-Dimer, Quant: 0.67 ug/mL-FEU — ABNORMAL HIGH (ref 0.00–0.50)

## 2019-10-26 LAB — TROPONIN I (HIGH SENSITIVITY): Troponin I (High Sensitivity): 3 ng/L (ref <18)

## 2019-10-26 MED ORDER — METRONIDAZOLE 500 MG PO TABS: 500.0000 mg | ORAL_TABLET | Freq: Two times a day (BID) | ORAL | 0 refills | Status: DC

## 2019-10-26 MED ORDER — SODIUM CHLORIDE (PF) 0.9 % IJ SOLN
INTRAMUSCULAR | Status: AC
  Filled 2019-10-26: qty 50

## 2019-10-26 MED ORDER — ONDANSETRON HCL 4 MG/2ML IJ SOLN
4.0000 mg | Freq: Once | INTRAMUSCULAR | Status: AC
  Administered 2019-10-26: 4 mg via INTRAVENOUS
  Filled 2019-10-26: qty 2

## 2019-10-26 MED ORDER — CIPROFLOXACIN HCL 500 MG PO TABS: 500.0000 mg | ORAL_TABLET | Freq: Two times a day (BID) | ORAL | 0 refills | Status: DC

## 2019-10-26 MED ORDER — SODIUM CHLORIDE 0.9% FLUSH
3.0000 mL | Freq: Once | INTRAVENOUS | Status: AC
  Administered 2019-10-26: 21:00:00 3 mL via INTRAVENOUS

## 2019-10-26 MED ORDER — KETOROLAC TROMETHAMINE 15 MG/ML IJ SOLN
15.0000 mg | Freq: Once | INTRAMUSCULAR | Status: AC
  Administered 2019-10-26: 21:00:00 15 mg via INTRAVENOUS
  Filled 2019-10-26: qty 1

## 2019-10-26 MED ORDER — OMEPRAZOLE 20 MG PO CPDR: 20.0000 mg | DELAYED_RELEASE_CAPSULE | Freq: Every day | ORAL | 0 refills | Status: DC

## 2019-10-26 MED ORDER — IOHEXOL 350 MG/ML SOLN
100.0000 mL | Freq: Once | INTRAVENOUS | Status: AC | PRN
  Administered 2019-10-26: 22:00:00 100 mL via INTRAVENOUS

## 2019-10-26 MED ORDER — ACETAMINOPHEN 325 MG PO TABS
650.0000 mg | ORAL_TABLET | Freq: Once | ORAL | Status: AC
  Administered 2019-10-26: 650 mg via ORAL
  Filled 2019-10-26: qty 2

## 2019-10-26 NOTE — Discharge Instructions (Signed)
CT's today showed some findings of likely acid reflux and esophageal irritation along with colitis. Take the prescribed medication as directed-- sent to your pharmacy.   I would try to avoid spicy/acidic foods and large amounts of NSAIDs (motrin, aleve, BC powder, ibuprofen, etc) as this can make it worse. Follow-up with your primary care doctor. Return to the ED for new or worsening symptoms-- severe pain, uncontrolled vomiting, blood in stool or emesis, etc.

## 2019-10-26 NOTE — ED Provider Notes (Signed)
[facility] DEPT Provider Note   CSN: [id number] Arrival date & time: 10/26/19  1753     History   Chief Complaint Chief Complaint  Patient presents with  . Chest Pain    HPI Danielle Villa is a 58 y.o. female.     58 year old female presents for evaluation of chest abdominal discomfort.  Patient complains of 4 days of sharp left-sided chest pain.  This radiates through to her back.  She also complains of mid epigastric abdominal discomfort also radiated to her back.  She reports that the symptoms are similar to the last time that she had diverticulitis.  She denies fever at home.  She denies nausea or vomiting.  She does appear to be quite anxious and uncomfortable on initial exam.  The history is provided by the patient and medical records.  Chest Pain Pain location:  L chest Pain quality: sharp   Pain radiates to:  Mid back Pain severity:  Mild Onset quality:  Gradual Duration:  4 days Timing:  Constant Progression:  Waxing and waning Chronicity:  New Relieved by:  Nothing Worsened by:  Nothing   Past Medical History:  Diagnosis Date  . Anxiety   . Arthritis   . Back pain   . Depression   . Headache     Patient Active Problem List   Diagnosis Date Noted  . Urinary incontinence 09/10/2019  . Screening examination for STD (sexually transmitted disease) 09/10/2019  . H/O colectomy 01/13/2019  . Refusal of blood transfusions as patient is Jehovah's Witness 01/11/2019  . Diverticulitis 01/06/2019  . Diverticulitis of colon s/p colectomy 01/06/2019 11/26/2018  . Headache 11/26/2018  . Abdominal pain, epigastric 08/20/2018  . Pain of both hip joints 08/06/2018  . TMJ (temporomandibular joint disorder) 08/03/2018  . Ringworm 05/17/2018  . OA (osteoarthritis) of hip 03/15/2018  . Sleep-wake cycle disorder 03/15/2018  . Trochanteric bursitis of both hips 12/19/2017  . Abdominal pain, left lower quadrant 11/28/2017  . Encounter  for disability assessment 11/13/2017  . Chronic hand pain, right 10/16/2017  . Hypertrophic lichen planus [id number]  . Mood disturbance 09/07/2017  . Left knee pain 07/04/2017  . Constipation 05/17/2017  . POLYURIA 12/29/2009  . ONYCHOMYCOSIS, TOENAILS 02/01/2009  . FIBROIDS, UTERUS 04/29/2008  . MENORRHAGIA 03/06/2008  . HYPERCHOLESTEROLEMIA 02/07/2007  . PANIC ATTACKS 02/07/2007  . BACK PAIN, LOW 02/07/2007  . Incontinence of feces 02/07/2007    Past Surgical History:  Procedure Laterality Date  . APPLICATION OF WOUND VAC  01/06/2019   Procedure: APPLICATION OF WOUND VAC;  Surgeon: Clovis Riley, MD;  Location: [facility];  Service: General;;  . cyst removed from ovary     . CYSTOSCOPY WITH STENT PLACEMENT Bilateral 01/06/2019   Procedure: CYSTOSCOPY WITH BILATERAL STENT PLACEMENT;  Surgeon: Raynelle Bring, MD;  Location: [facility];  Service: Urology;  Laterality: Bilateral;  . LAPAROSCOPIC SIGMOID COLECTOMY N/A 01/06/2019   Procedure: LAPAROSCOPIC EXTENDED LEFT HEMI-  COLECTOMY;  Surgeon: Clovis Riley, MD;  Location: [facility];  Service: General;  Laterality: N/A;  . OOPHORECTOMY  01/06/2019   Procedure: LEFT OOPHORECTOMY;  Surgeon: Clovis Riley, MD;  Location: [facility];  Service: General;;  . PROCTOSCOPY  01/06/2019   Procedure: PROCTOSCOPY;  Surgeon: Clovis Riley, MD;  Location: [facility];  Service: General;;  . TONSILLECTOMY       OB History    Gravida  5   Para  4   Term  4   Preterm  AB      Living  4     SAB      TAB      Ectopic      Multiple      Live Births  4            Home Medications    Prior to Admission medications   Medication Sig Start Date End Date Taking? Authorizing Provider  acetaminophen (TYLENOL) 325 MG tablet Take 2 tablets (650 mg total) by mouth every 6 (six) hours as needed (Take tylenol and ibuprofen around the clock for the first 1-2 days after you get home, then take as needed.). 01/10/19  Yes Clovis Riley, MD   aspirin (ASPIRIN EC) 81 MG EC tablet Take 1 tablet (81 mg total) by mouth daily. Patient taking differently: Take 81 mg by mouth at bedtime.  09/17/18  Yes Glenis Smoker, MD  atorvastatin (LIPITOR) 20 MG tablet Take 1 tablet (20 mg total) by mouth every evening. 09/17/18  Yes Glenis Smoker, MD  ferrous sulfate 325 (65 FE) MG tablet Take 1 tablet (325 mg total) by mouth daily with breakfast. 04/23/19  Yes Glenis Smoker, MD  hydrocortisone (ANUSOL-HC) 2.5 % rectal cream Place rectally 2 (two) times daily as needed for hemorrhoids or anal itching. 01/16/19  Yes Clovis Riley, MD  ondansetron (ZOFRAN-ODT) 4 MG disintegrating tablet Take 1 tablet (4 mg total) by mouth every 6 (six) hours as needed for nausea. 01/16/19  Yes Clovis Riley, MD  PARoxetine (PAXIL) 20 MG tablet Take 1 tablet (20 mg total) by mouth daily. 04/21/19  Yes Glenis Smoker, MD  traMADol (ULTRAM) 50 MG tablet Take 1 tablet (50 mg total) by mouth every 6 (six) hours as needed for moderate pain. 04/18/19  Yes Glenis Smoker, MD  traZODone (DESYREL) 50 MG tablet Take 1 tablet (50 mg total) by mouth at bedtime as needed. for sleep 04/23/19  Yes Glenis Smoker, MD  ibuprofen (ADVIL,MOTRIN) 800 MG tablet Take 0.5 tablets (400 mg total) by mouth every 6 (six) hours as needed for mild pain or moderate pain (Take tylenol and ibuprofen aroudn the clock for the first 1-2 days after you get home. Then take as needed.). Patient not taking: Reported on 10/26/2019 01/10/19   Clovis Riley, MD  Multiple Vitamin (MULTIVITAMIN WITH MINERALS) TABS tablet Take 1 tablet by mouth daily. Patient not taking: Reported on 10/26/2019 01/11/19   Clovis Riley, MD  polyethylene glycol [facility] / Floria Raveling) packet Take 17 g by mouth daily. Patient not taking: Reported on 10/26/2019 01/16/19   Clovis Riley, MD    Family History Family History  Problem Relation Age of Onset  . Hypertension Mother   . Diabetes  Mother   . Hypertension Maternal Grandmother     Social History Social History   Tobacco Use  . Smoking status: Former Research scientist (life sciences)  . Smokeless tobacco: Never Used  Substance Use Topics  . Alcohol use: Yes    Comment: occasionaly  . Drug use: No     Allergies   Other   Review of Systems Review of Systems  Cardiovascular: Positive for chest pain.  All other systems reviewed and are negative.    Physical Exam Updated Vital Signs BP (!) 150/91 (BP Location: Left Arm)   Pulse 84   Temp 100.2 F (37.9 C) (Oral)   Resp 19   Ht 5\' 2"  (1.575 m)   Wt 61.2 kg   SpO2  100%   BMI 24.69 kg/m   Physical Exam Vitals signs and nursing note reviewed.  Constitutional:      General: She is not in acute distress.    Appearance: She is well-developed.  HENT:     Head: Normocephalic and atraumatic.  Eyes:     Conjunctiva/sclera: Conjunctivae normal.     Pupils: Pupils are equal, round, and reactive to light.  Neck:     Musculoskeletal: Normal range of motion and neck supple.  Cardiovascular:     Rate and Rhythm: Normal rate and regular rhythm.     Heart sounds: Normal heart sounds.  Pulmonary:     Effort: Pulmonary effort is normal. No respiratory distress.     Breath sounds: Normal breath sounds.  Abdominal:     General: There is no distension.     Palpations: Abdomen is soft.     Tenderness: There is abdominal tenderness.     Comments: Mild epigastric tenderness with palpation  Musculoskeletal: Normal range of motion.        General: No deformity.  Skin:    General: Skin is warm and dry.  Neurological:     Mental Status: She is alert and oriented to person, place, and time.      ED Treatments / Results  Labs (all labs ordered are listed, but only abnormal results are displayed) Labs Reviewed  CBC - Abnormal; Notable for the following components:      Result Value   WBC 13.5 (*)    Hemoglobin 15.2 (*)    HCT 46.6 (*)    All other components within normal limits   D-DIMER, QUANTITATIVE (NOT AT [facility]) - Abnormal; Notable for the following components:   D-Dimer, Quant 0.67 (*)    All other components within normal limits  COMPREHENSIVE METABOLIC PANEL - Abnormal; Notable for the following components:   Glucose, Bld 146 (*)    Total Protein 8.3 (*)    All other components within normal limits  SARS CORONAVIRUS 2 (TAT 6-24 HRS)  TROPONIN I (HIGH SENSITIVITY)    EKG EKG Interpretation  Date/Time:  Sunday October 26 2019 18:13:54 EST Ventricular Rate:  76 PR Interval:    QRS Duration: 101 QT Interval:  394 QTC Calculation: 443 R Axis:   -3 Text Interpretation: Sinus rhythm Baseline wander No significant change since last tracing Confirmed by Lajean Saver [id number]) on 10/26/2019 7:02:46 PM   Radiology No results found.  Procedures Procedures (including critical care time)  Medications Ordered in ED Medications  sodium chloride flush (NS) 0.9 % injection 3 mL (has no administration in time range)  ondansetron (ZOFRAN) injection 4 mg (has no administration in time range)  ketorolac (TORADOL) 15 MG/ML injection 15 mg (has no administration in time range)  acetaminophen (TYLENOL) tablet 650 mg (650 mg Oral Given 10/26/19 2043)     Initial Impression / Assessment and Plan / ED Course  I have reviewed the triage vital signs and the nursing notes.  Pertinent labs & imaging results that were available during my care of the patient were reviewed by me and considered in my medical decision making (see chart for details).        MDM  Screen complete  MARABELLA FOO was evaluated in Emergency Department on 10/26/2019 for the symptoms described in the history of present illness. She was evaluated in the context of the global COVID-19 pandemic, which necessitated consideration that the patient might be at risk for infection with the SARS-CoV-2 virus that  causes COVID-19. Institutional protocols and algorithms that pertain to the evaluation  of patients at risk for COVID-19 are in a state of rapid change based on information released by regulatory bodies including the CDC and federal and state organizations. These policies and algorithms were followed during the patient's care in the ED.   Final Clinical Impressions(s) / ED Diagnoses   Final diagnoses:  Colitis  Gastroesophageal reflux disease without esophagitis    ED Discharge Orders         Ordered    omeprazole (PRILOSEC) 20 MG capsule  Daily     10/26/19 2345    ciprofloxacin (CIPRO) 500 MG tablet  Every 12 hours     10/26/19 2345    metroNIDAZOLE (FLAGYL) 500 MG tablet  2 times daily     10/26/19 2345           Valarie Merino, MD 10/29/19 [phone]

## 2019-10-26 NOTE — ED Notes (Signed)
UA sent to lab if needed

## 2019-10-26 NOTE — ED Provider Notes (Signed)
Assumed care from Dr. Francia Greaves at shift change.  See prior notes for full H&P.  Briefly, 58 y.o. F here with chest and abdominal pain for 4 days.  Story somewhat atypical for ACS.  Does report thinking she may have diverticulitis again.  Thus far, has normal EKG, mildly elevated WBC count, and positive d-dimer.    Plan:  CTA chest along with CT AP for further evaluation.  Results for orders placed or performed during the hospital encounter of 10/26/19  CBC  Result Value Ref Range   WBC 13.5 (H) 4.0 - 10.5 K/uL   RBC 4.89 3.87 - 5.11 MIL/uL   Hemoglobin 15.2 (H) 12.0 - 15.0 g/dL   HCT 46.6 (H) 36.0 - 46.0 %   MCV 95.3 80.0 - 100.0 fL   MCH 31.1 26.0 - 34.0 pg   MCHC 32.6 30.0 - 36.0 g/dL   RDW 14.4 11.5 - 15.5 %   Platelets 272 150 - 400 K/uL   nRBC 0.0 0.0 - 0.2 %  D-dimer, quantitative (not at [facility])  Result Value Ref Range   D-Dimer, Quant 0.67 (H) 0.00 - 0.50 ug/mL-FEU  Comprehensive metabolic panel  Result Value Ref Range   Sodium 139 135 - 145 mmol/L   Potassium 3.5 3.5 - 5.1 mmol/L   Chloride 102 98 - 111 mmol/L   CO2 23 22 - 32 mmol/L   Glucose, Bld 146 (H) 70 - 99 mg/dL   BUN 17 6 - 20 mg/dL   Creatinine, Ser 0.66 0.44 - 1.00 mg/dL   Calcium 9.6 8.9 - 10.3 mg/dL   Total Protein 8.3 (H) 6.5 - 8.1 g/dL   Albumin 4.6 3.5 - 5.0 g/dL   AST 24 15 - 41 U/L   ALT 19 0 - 44 U/L   Alkaline Phosphatase 81 38 - 126 U/L   Total Bilirubin 0.6 0.3 - 1.2 mg/dL   GFR calc non Af Amer >60 >60 mL/min   GFR calc Af Amer >60 >60 mL/min   Anion gap 14 5 - 15  Troponin I (High Sensitivity)  Result Value Ref Range   Troponin I (High Sensitivity) 3 <18 ng/L   Dg Chest 2 View  Result Date: 10/26/2019 CLINICAL DATA:  Shortness of breath, chest pain EXAM: CHEST - 2 VIEW COMPARISON:  01/12/2019 FINDINGS: The heart size and mediastinal contours are within normal limits. Both lungs are clear. The visualized skeletal structures are unremarkable. IMPRESSION: No acute abnormality of the lungs.  Electronically Signed   By: Eddie Candle M.D.   On: 10/26/2019 19:33   Ct Angio Chest Pe W And/or Wo Contrast  Result Date: 10/26/2019 CLINICAL DATA:  Left-sided chest pain. Fever. EXAM: CT ANGIOGRAPHY CHEST WITH CONTRAST TECHNIQUE: Multidetector CT imaging of the chest was performed using the standard protocol during bolus administration of intravenous contrast. Multiplanar CT image reconstructions and MIPs were obtained to evaluate the vascular anatomy. Performed in conjunction with CT of the abdomen/pelvis, reported separately. CONTRAST:  193mL OMNIPAQUE IOHEXOL 350 MG/ML SOLN COMPARISON:  Chest radiograph earlier this day. Chest CTA 01/01/2019 FINDINGS: Cardiovascular: Mild to moderate motion artifact limitations. Allowing for this, there are no filling defects within the pulmonary arteries to suggest pulmonary embolus. Evaluation is diagnostic to the distal segmental level. The thoracic aorta is normal in caliber. No aortic dissection. Aortic branch vessels are grossly normal, partially obscured by streak artifact from dense IV contrast in the right subclavian vein. No pericardial effusion. Mediastinum/Nodes: No enlarged mediastinal or hilar lymph nodes. No visualized  thyroid nodule. No esophageal wall thickening. Distal esophagus is slightly patulous, similar to prior exam. Lungs/Pleura: Mild hypoventilatory changes at the lung bases. No evidence of pneumonia. No pleural fluid. No pulmonary edema. Trachea and mainstem bronchi are patent. Upper Abdomen: Assessed on abdominopelvic CT performed concurrently, reported separately. Musculoskeletal: There are no acute or suspicious osseous abnormalities. Review of the MIP images confirms the above findings. IMPRESSION: No pulmonary embolus or acute intrathoracic abnormality. Patulous distal esophagus as seen on prior exam, nonspecific but can be seen with reflux. No esophageal wall thickening. Electronically Signed   By: Keith Rake M.D.   On: 10/26/2019  22:45   Ct Abdomen Pelvis W Contrast  Result Date: 10/26/2019 CLINICAL DATA:  Acute abdominal pain. Fever. EXAM: CT ABDOMEN AND PELVIS WITH CONTRAST TECHNIQUE: Multidetector CT imaging of the abdomen and pelvis was performed using the standard protocol following bolus administration of intravenous contrast. Finding conjunction with CTA of the chest, reported separately. CONTRAST:  123mL OMNIPAQUE IOHEXOL 350 MG/ML SOLN COMPARISON:  CT 01/12/2019 FINDINGS: Lower chest: Mild hypoventilatory changes at the lung bases. Assessed on concurrent chest CT reported separately. Hepatobiliary: No focal liver abnormality is seen. No gallstones, gallbladder wall thickening, or biliary dilatation. Pancreas: No ductal dilatation or inflammation. Spleen: Normal in size without focal abnormality. Adrenals/Urinary Tract: Normal adrenal glands. No hydronephrosis or perinephric edema. Homogeneous renal enhancement with symmetric excretion on delayed phase imaging. Small cyst in the right kidney, largest in the upper pole measuring 15 mm. No suspicious or solid lesion. Urinary bladder is physiologically distended without wall thickening. Stomach/Bowel: Fluid-filled stomach without gastric wall thickening. Small duodenal diverticulum. No small bowel inflammation, obstruction, or inflammatory change. Small bowel loops adhered to the anterior abdominal wall in the central and right abdomen, possible adhesions. High-riding cecum in the right mid abdomen, similar to prior exam. Appendix is not confidently visualized, no evidence of appendicitis. Prior left colectomy. There is mild wall thickening of the descending/distal transverse colon without pericolonic edema. Enteric sutures noted in the sigmoid. Diverticula in the right and proximal transverse colon without acute diverticulitis. Vascular/Lymphatic: Mild aorta bi-iliac atherosclerosis. Portal vein is patent. No acute vascular abnormalities. No adenopathy. Reproductive: Small fundal  fibroid in the uterus. Suspicious adnexal mass. Other: No free air or ascites. No intra-abdominal abscess. Previous left pelvic fluid collection has resolved. Musculoskeletal: Mild degenerative change in the sacroiliac joints and spine. There are no acute or suspicious osseous abnormalities. IMPRESSION: 1. Mild wall thickening of the descending/distal transverse colon, suspicious for mild colitis. 2. Proximal colonic diverticulosis without acute inflammation. Aortic Atherosclerosis (ICD10-I70.0). Electronically Signed   By: Keith Rake M.D.   On: 10/26/2019 22:53   11:44 PM Patient appears comfortable on re-check, talking with friend on the phone.  CT's with findings of possible mild colitis of descending/distal transverse colon as well as patulous distal esophagus (?reflux).  No findings of PE, bowel perforation, or abscess formation.  Troponin has come back negative.  As she has had symptoms for 4 days, do not feel this needs to be repeated.  Patient does have elevated WBC count and arrived with fever of 100.44F so will treat with course of abx.  Will also start PPI as she does admit to some reflux type symptoms recently.  Will need close follow-up with PCP.   Return here for any new/acute changes.   Larene Pickett, PA-C 10/27/19 0140    Fatima Blank, MD 10/27/19 [phone]

## 2019-10-26 NOTE — ED Triage Notes (Signed)
Patient here from home with complaints of left sided chest pain radiating into back x4 days increased today. Nausea.

## 2019-10-27 LAB — SARS CORONAVIRUS 2 (TAT 6-24 HRS): SARS Coronavirus 2: NEGATIVE

## 2019-11-05 ENCOUNTER — Other Ambulatory Visit: Payer: Self-pay

## 2019-11-05 ENCOUNTER — Telehealth: Payer: Self-pay | Admitting: *Deleted

## 2019-11-05 ENCOUNTER — Ambulatory Visit (INDEPENDENT_AMBULATORY_CARE_PROVIDER_SITE_OTHER): Payer: Medicaid Other | Admitting: Family Medicine

## 2019-11-05 ENCOUNTER — Encounter: Payer: Self-pay | Admitting: Family Medicine

## 2019-11-05 VITALS — BP 122/84 | HR 76 | Wt 143.0 lb

## 2019-11-05 DIAGNOSIS — F410 Panic disorder [episodic paroxysmal anxiety] without agoraphobia: Secondary | ICD-10-CM | POA: Diagnosis not present

## 2019-11-05 DIAGNOSIS — F4310 Post-traumatic stress disorder, unspecified: Secondary | ICD-10-CM

## 2019-11-05 DIAGNOSIS — Z23 Encounter for immunization: Secondary | ICD-10-CM | POA: Diagnosis not present

## 2019-11-05 DIAGNOSIS — G8929 Other chronic pain: Secondary | ICD-10-CM | POA: Diagnosis not present

## 2019-11-05 DIAGNOSIS — F3341 Major depressive disorder, recurrent, in partial remission: Secondary | ICD-10-CM | POA: Insufficient documentation

## 2019-11-05 DIAGNOSIS — Z9049 Acquired absence of other specified parts of digestive tract: Secondary | ICD-10-CM | POA: Diagnosis not present

## 2019-11-05 DIAGNOSIS — M545 Low back pain: Secondary | ICD-10-CM

## 2019-11-05 DIAGNOSIS — K529 Noninfective gastroenteritis and colitis, unspecified: Secondary | ICD-10-CM

## 2019-11-05 MED ORDER — PAROXETINE HCL 20 MG PO TABS: 20.0000 mg | ORAL_TABLET | Freq: Every day | ORAL | 0 refills | Status: DC

## 2019-11-05 MED ORDER — CHOLESTYRAMINE 4 G PO PACK: 4.0000 g | PACK | Freq: Every day | ORAL | 12 refills | Status: AC

## 2019-11-05 MED ORDER — TRAMADOL HCL 50 MG PO TABS: 50.0000 mg | ORAL_TABLET | Freq: Four times a day (QID) | ORAL | 0 refills | Status: DC | PRN

## 2019-11-05 NOTE — Assessment & Plan Note (Signed)
Likely secondary to left-sided hemicolectomy in January 2020.  Advised patient that this changes the absorption of the food that she eats and that this will likely be a long-term problem for her, but hopefully there are things we can do to improve this.  Advised use of Metamucil.  Also will trial cholestyramine.  Advised A&D use on her rectum for irritation.  Also refer to GI to follow-up with them regarding this to see if there is anything else that can be offered.

## 2019-11-05 NOTE — Patient Instructions (Signed)
Thank you for coming to see me today. It was a pleasure. Today we talked about:   Your bowels: Try A&D ointment on your bottom.  Get metamucil and take once a day.  I have sent a prescription to the pharmacy for a medication to see if it will help with your diarrhea.  I have placed a referral to the stomach doctor for your diarrhea.  If you do not hear from them in the next 2 weeks, please give [place] a call.  Your Back Pain: I have placed a referral to PT for your back pain.  If you do not hear from them in the next 2 weeks, please give [place] a call.  Dr. Erlinda Hong (Orthopedic Doctor) [phone]  Dr. Ernestina Patches (Doctor that gave you injections in your back) is at the same number  Please follow-up with me at your convenience to talk about your mood and other health maintenance.  If you have any questions or concerns, please do not hesitate to call the office at [phone].  Best,   [place] Constable, DO

## 2019-11-05 NOTE — Progress Notes (Signed)
Subjective: Chief Complaint  Patient presents with  . Back Pain     HPI: Danielle Villa is a 58 y.o. presenting to clinic today to discuss the following:  1 Abdominal Pain Patient has a history of diverticulitis.  S/P Left hemicolectomy in January 2020.  States that she is constantly having bowel movmenets since that time.  Has tried cutting out wine, but no improvement.  Tried peptobismol with no improvmeent.  Notes that her bottom is raw because she is going up to 20 times a day.  Has not been following with GI, was released by general surgeon.  Had episodes of colitis on 11/15 and was seen by ED.  Notes this was the worst it had been since surgery.  Pain improved with cipro and flagyl, and omeprazole.  Pain was gone the next day.  She is still taking omeprazole.  2 Back Pain Chronic back pain unchanged in origin.  No bladder incontinence, bowel function per above.  Has been seen by ortho and received epidural steroid injections as well by Dr. Ernestina Patches.  Wants to go back to Physical Therapy.  Also would like to know the number of the other doctors she has seen for this to go back.  Requesting a refill on Tramadol as she has used this in the past with good relief.  PMP reviewed with no red flags, last Rx in June.  3 Depression  Currently on Paxil.  Denies SI.  Wants a refill on Paxil.    Office Visit from 11/05/2019 in [place]  PHQ-9 Total Score  17         ROS noted in HPI. Chief complaint noted.  Other Pertinent PMH: Hyperlipidemia, MDD, PTSD Past Medical, Surgical, Social, and Family History Reviewed & Updated per EMR.      Social History   Tobacco Use  Smoking Status Former Smoker  Smokeless Tobacco Never Used   Smoking status noted.    Objective: BP 122/84   Pulse 76   Wt 143 lb (64.9 kg)   SpO2 100%   BMI 26.16 kg/m  Vitals and nursing notes reviewed  Physical Exam:  General: 58 y.o. female in NAD Cardio: RRR no m/r/g  Lungs: CTAB, no wheezing, no rhonchi, no crackles, no IWOB on RA Abdomen: Soft, midline scar, diffusely mildly tender to palpation, non-distended, positive bowel sounds Skin: warm and dry Extremities: No edema, ambulates without difficulty Neuro: sensation intact BLE Back: no midline TTP, 5/5 strength BLE, full ROM in flexion and extension with pain at extremes of ROM   No results found for this or any previous visit (from the past 72 hour(s)).  Assessment/Plan:  Chronic diarrhea Likely secondary to left-sided hemicolectomy in January 2020.  Advised patient that this changes the absorption of the food that she eats and that this will likely be a long-term problem for her, but hopefully there are things we can do to improve this.  Advised use of Metamucil.  Also will trial cholestyramine.  Advised A&D use on her rectum for irritation.  Also refer to GI to follow-up with them regarding this to see if there is anything else that can be offered.  BACK PAIN, LOW Chronic and unchanged in nature.  No change in bowel problems, therefore doubt that this is related to her back pain.  Exam is reassuring as she is neurovascularly intact distally.  She has followed with Ortho in the past and gotten steroid injections.  Given number for  their office that she can follow-up with them again.  Referred to physical therapy.  Also refilled tramadol at patient request, as is been causing her significant pain.  PMP reviewed without red flags.  MDD (major depressive disorder), recurrent, in partial remission (HCC) PHQ-9 today at 76.  Patient denies SI.  Did advise patient to come back for follow-up to discuss this further.  For now we will continue Paxil 20 mg daily.  Refill sent.     PATIENT EDUCATION PROVIDED: See AVS    Diagnosis and plan along with any newly prescribed medication(s) were discussed in detail with this patient today. The patient verbalized understanding and agreed with the plan. Patient  advised if symptoms worsen return to clinic or ER.   Health Maintainance: planning to come back for mammogram.  Referral to GI. Flu shot today.   Orders Placed This Encounter  Procedures  . Flu Vaccine QUAD 36+ mos IM  . Ambulatory referral to Gastroenterology    Referral Priority:   Routine    Referral Type:   Consultation    Referral Reason:   Specialty Services Required    Number of Visits Requested:   1  . Ambulatory referral to Physical Therapy    Referral Priority:   Routine    Referral Type:   Physical Medicine    Referral Reason:   Specialty Services Required    Requested Specialty:   Physical Therapy    Number of Visits Requested:   1    Meds ordered this encounter  Medications  . cholestyramine (QUESTRAN) 4 g packet    Sig: Take 1 packet (4 g total) by mouth daily.    Dispense:  60 each    Refill:  12  . PARoxetine (PAXIL) 20 MG tablet    Sig: Take 1 tablet (20 mg total) by mouth daily.    Dispense:  90 tablet    Refill:  0  . traMADol (ULTRAM) 50 MG tablet    Sig: Take 1 tablet (50 mg total) by mouth every 6 (six) hours as needed for moderate pain.    Dispense:  30 tablet    Refill:  Merkel, DO 11/05/2019, 1:10 PM PGY-2 [place]

## 2019-11-05 NOTE — Assessment & Plan Note (Addendum)
Chronic and unchanged in nature.  No change in bowel problems, therefore doubt that this is related to her back pain.  Exam is reassuring as she is neurovascularly intact distally.  She has followed with Ortho in the past and gotten steroid injections.  Given number for their office that she can follow-up with them again.  Referred to physical therapy.  Also refilled tramadol at patient request, as is been causing her significant pain.  PMP reviewed without red flags.

## 2019-11-05 NOTE — Telephone Encounter (Signed)
Prior approval for tramadol completed via [place] [employer].  Med approved for 11/05/2019 - 12/05/2019.  [place] informed.  Christen Bame, CMA

## 2019-11-05 NOTE — Assessment & Plan Note (Signed)
PHQ-9 today at 17.  Patient denies SI.  Did advise patient to come back for follow-up to discuss this further.  For now we will continue Paxil 20 mg daily.  Refill sent.

## 2019-11-10 DIAGNOSIS — F4310 Post-traumatic stress disorder, unspecified: Secondary | ICD-10-CM | POA: Diagnosis not present

## 2019-11-28 DIAGNOSIS — M9902 Segmental and somatic dysfunction of thoracic region: Secondary | ICD-10-CM | POA: Diagnosis not present

## 2019-11-28 DIAGNOSIS — M5414 Radiculopathy, thoracic region: Secondary | ICD-10-CM | POA: Diagnosis not present

## 2019-11-28 DIAGNOSIS — M9901 Segmental and somatic dysfunction of cervical region: Secondary | ICD-10-CM | POA: Diagnosis not present

## 2019-11-28 DIAGNOSIS — M5137 Other intervertebral disc degeneration, lumbosacral region: Secondary | ICD-10-CM | POA: Diagnosis not present

## 2019-11-28 DIAGNOSIS — M9903 Segmental and somatic dysfunction of lumbar region: Secondary | ICD-10-CM | POA: Diagnosis not present

## 2019-11-28 DIAGNOSIS — S134XXA Sprain of ligaments of cervical spine, initial encounter: Secondary | ICD-10-CM | POA: Diagnosis not present

## 2019-12-29 ENCOUNTER — Other Ambulatory Visit: Payer: Self-pay | Admitting: *Deleted

## 2019-12-29 DIAGNOSIS — F410 Panic disorder [episodic paroxysmal anxiety] without agoraphobia: Secondary | ICD-10-CM

## 2019-12-29 DIAGNOSIS — F3341 Major depressive disorder, recurrent, in partial remission: Secondary | ICD-10-CM

## 2019-12-29 DIAGNOSIS — F4310 Post-traumatic stress disorder, unspecified: Secondary | ICD-10-CM

## 2019-12-30 MED ORDER — PAROXETINE HCL 20 MG PO TABS: 20.0000 mg | ORAL_TABLET | Freq: Every day | ORAL | 0 refills | Status: AC

## 2019-12-30 MED ORDER — ATORVASTATIN CALCIUM 20 MG PO TABS: 20.0000 mg | ORAL_TABLET | Freq: Every evening | ORAL | 6 refills | Status: AC

## 2020-01-04 ENCOUNTER — Other Ambulatory Visit: Payer: Self-pay | Admitting: Family Medicine

## 2020-01-04 DIAGNOSIS — G8929 Other chronic pain: Secondary | ICD-10-CM

## 2020-01-13 ENCOUNTER — Encounter: Payer: Self-pay | Admitting: Family Medicine

## 2020-07-30 DIAGNOSIS — F3341 Major depressive disorder, recurrent, in partial remission: Secondary | ICD-10-CM | POA: Diagnosis not present

## 2020-07-30 DIAGNOSIS — E782 Mixed hyperlipidemia: Secondary | ICD-10-CM | POA: Diagnosis not present

## 2020-07-30 DIAGNOSIS — M5431 Sciatica, right side: Secondary | ICD-10-CM | POA: Diagnosis not present

## 2020-07-30 DIAGNOSIS — Z0000 Encounter for general adult medical examination without abnormal findings: Secondary | ICD-10-CM | POA: Diagnosis not present

## 2020-07-30 DIAGNOSIS — Z136 Encounter for screening for cardiovascular disorders: Secondary | ICD-10-CM | POA: Diagnosis not present

## 2020-07-30 DIAGNOSIS — E663 Overweight: Secondary | ICD-10-CM | POA: Diagnosis not present

## 2020-07-30 DIAGNOSIS — Z131 Encounter for screening for diabetes mellitus: Secondary | ICD-10-CM | POA: Diagnosis not present

## 2020-07-30 DIAGNOSIS — D508 Other iron deficiency anemias: Secondary | ICD-10-CM | POA: Diagnosis not present

## 2020-09-03 DIAGNOSIS — E782 Mixed hyperlipidemia: Secondary | ICD-10-CM | POA: Diagnosis not present

## 2020-09-03 DIAGNOSIS — K5730 Diverticulosis of large intestine without perforation or abscess without bleeding: Secondary | ICD-10-CM | POA: Diagnosis not present

## 2020-09-03 DIAGNOSIS — F3341 Major depressive disorder, recurrent, in partial remission: Secondary | ICD-10-CM | POA: Diagnosis not present

## 2020-09-03 DIAGNOSIS — D508 Other iron deficiency anemias: Secondary | ICD-10-CM | POA: Diagnosis not present

## 2020-09-03 DIAGNOSIS — R7303 Prediabetes: Secondary | ICD-10-CM | POA: Diagnosis not present

## 2020-09-03 DIAGNOSIS — R350 Frequency of micturition: Secondary | ICD-10-CM | POA: Diagnosis not present

## 2020-09-03 DIAGNOSIS — M5431 Sciatica, right side: Secondary | ICD-10-CM | POA: Diagnosis not present

## 2020-11-19 ENCOUNTER — Other Ambulatory Visit: Payer: Self-pay | Admitting: Physician Assistant

## 2020-11-19 DIAGNOSIS — R7303 Prediabetes: Secondary | ICD-10-CM | POA: Diagnosis not present

## 2020-11-19 DIAGNOSIS — F3341 Major depressive disorder, recurrent, in partial remission: Secondary | ICD-10-CM | POA: Diagnosis not present

## 2020-11-19 DIAGNOSIS — E782 Mixed hyperlipidemia: Secondary | ICD-10-CM | POA: Diagnosis not present

## 2020-11-19 DIAGNOSIS — K5730 Diverticulosis of large intestine without perforation or abscess without bleeding: Secondary | ICD-10-CM | POA: Diagnosis not present

## 2020-11-19 DIAGNOSIS — R413 Other amnesia: Secondary | ICD-10-CM | POA: Diagnosis not present

## 2020-11-19 DIAGNOSIS — M5431 Sciatica, right side: Secondary | ICD-10-CM | POA: Diagnosis not present

## 2020-11-19 DIAGNOSIS — Z1231 Encounter for screening mammogram for malignant neoplasm of breast: Secondary | ICD-10-CM

## 2020-11-19 DIAGNOSIS — D508 Other iron deficiency anemias: Secondary | ICD-10-CM | POA: Diagnosis not present

## 2020-12-31 ENCOUNTER — Other Ambulatory Visit: Payer: Self-pay

## 2020-12-31 ENCOUNTER — Ambulatory Visit
Admission: RE | Admit: 2020-12-31 | Discharge: 2020-12-31 | Disposition: A | Discharge status: Home / Self Care | Payer: Medicaid Other | Source: Ambulatory Visit | Attending: Physician Assistant | Admitting: Physician Assistant

## 2020-12-31 DIAGNOSIS — Z1231 Encounter for screening mammogram for malignant neoplasm of breast: Secondary | ICD-10-CM

## 2021-01-19 ENCOUNTER — Other Ambulatory Visit: Payer: Self-pay | Admitting: Physician Assistant

## 2021-01-19 DIAGNOSIS — K602 Anal fissure, unspecified: Secondary | ICD-10-CM | POA: Diagnosis not present

## 2021-01-19 DIAGNOSIS — R1033 Periumbilical pain: Secondary | ICD-10-CM

## 2021-01-19 DIAGNOSIS — R197 Diarrhea, unspecified: Secondary | ICD-10-CM | POA: Diagnosis not present

## 2021-01-19 DIAGNOSIS — Z9049 Acquired absence of other specified parts of digestive tract: Secondary | ICD-10-CM | POA: Diagnosis not present

## 2021-01-19 DIAGNOSIS — Z8719 Personal history of other diseases of the digestive system: Secondary | ICD-10-CM | POA: Diagnosis not present

## 2021-01-26 DIAGNOSIS — R413 Other amnesia: Secondary | ICD-10-CM | POA: Diagnosis not present

## 2021-01-26 DIAGNOSIS — F3341 Major depressive disorder, recurrent, in partial remission: Secondary | ICD-10-CM | POA: Diagnosis not present

## 2021-01-26 DIAGNOSIS — D508 Other iron deficiency anemias: Secondary | ICD-10-CM | POA: Diagnosis not present

## 2021-01-26 DIAGNOSIS — K5730 Diverticulosis of large intestine without perforation or abscess without bleeding: Secondary | ICD-10-CM | POA: Diagnosis not present

## 2021-01-26 DIAGNOSIS — R7303 Prediabetes: Secondary | ICD-10-CM | POA: Diagnosis not present

## 2021-01-26 DIAGNOSIS — E782 Mixed hyperlipidemia: Secondary | ICD-10-CM | POA: Diagnosis not present

## 2021-01-31 ENCOUNTER — Encounter: Payer: Self-pay | Admitting: *Deleted

## 2021-01-31 ENCOUNTER — Other Ambulatory Visit: Payer: Self-pay | Admitting: *Deleted

## 2021-02-01 ENCOUNTER — Encounter: Payer: Self-pay | Admitting: Diagnostic Neuroimaging

## 2021-02-01 ENCOUNTER — Ambulatory Visit (INDEPENDENT_AMBULATORY_CARE_PROVIDER_SITE_OTHER): Payer: Medicaid Other | Admitting: Diagnostic Neuroimaging

## 2021-02-01 VITALS — BP 108/62 | HR 82 | Ht 62.0 in | Wt 145.8 lb

## 2021-02-01 DIAGNOSIS — R413 Other amnesia: Secondary | ICD-10-CM

## 2021-02-01 NOTE — Patient Instructions (Signed)
MEMORY ISSUES / FOCUS ISSUES - likely related to chronic pain, insomnia, depression, anxiety, panic attacks; possible undiagnosed ADHD as child (diff focusing in school) - check MRI brain to rule out other causes
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
## 2021-02-02 ENCOUNTER — Telehealth: Payer: Self-pay | Admitting: Diagnostic Neuroimaging

## 2021-02-02 NOTE — Telephone Encounter (Signed)
[facility] uploaded notes can take up to 15 business days.

## 2021-02-04 ENCOUNTER — Inpatient Hospital Stay: Admission: RE | Admit: 2021-02-04 | Payer: Medicaid Other | Source: Ambulatory Visit

## 2021-02-07 NOTE — Telephone Encounter (Signed)
Checked the status on the portal it is still pending.  

## 2021-02-14 NOTE — Telephone Encounter (Signed)
mcd healthy blue Danielle Villa: [id number] (exp. 02/02/21 to 03/04/21) order sent to GI. They will reach out to the patient to schedule.

## 2021-02-15 DIAGNOSIS — R413 Other amnesia: Secondary | ICD-10-CM | POA: Diagnosis not present

## 2021-02-15 DIAGNOSIS — E782 Mixed hyperlipidemia: Secondary | ICD-10-CM | POA: Diagnosis not present

## 2021-02-15 DIAGNOSIS — K5730 Diverticulosis of large intestine without perforation or abscess without bleeding: Secondary | ICD-10-CM | POA: Diagnosis not present

## 2021-02-15 DIAGNOSIS — D508 Other iron deficiency anemias: Secondary | ICD-10-CM | POA: Diagnosis not present

## 2021-02-15 DIAGNOSIS — R7303 Prediabetes: Secondary | ICD-10-CM | POA: Diagnosis not present

## 2021-02-15 DIAGNOSIS — F3341 Major depressive disorder, recurrent, in partial remission: Secondary | ICD-10-CM | POA: Diagnosis not present

## 2021-02-28 ENCOUNTER — Ambulatory Visit
Admission: RE | Admit: 2021-02-28 | Discharge: 2021-02-28 | Disposition: A | Discharge status: Home / Self Care | Payer: Medicaid Other | Source: Ambulatory Visit | Attending: Diagnostic Neuroimaging | Admitting: Diagnostic Neuroimaging

## 2021-02-28 DIAGNOSIS — R413 Other amnesia: Secondary | ICD-10-CM | POA: Diagnosis not present

## 2021-02-28 MED ORDER — GADOBENATE DIMEGLUMINE 529 MG/ML IV SOLN
13.0000 mL | Freq: Once | INTRAVENOUS | Status: AC | PRN
  Administered 2021-02-28: 13 mL via INTRAVENOUS

## 2021-03-02 ENCOUNTER — Telehealth: Payer: Self-pay | Admitting: *Deleted

## 2021-03-02 NOTE — Telephone Encounter (Signed)
Called patient and informed her MRI brain is a normal scan. She asked what could cause memory loss; I reviewed Dr Gladstone Lighter note with her. Patient verbalized understanding, appreciation.

## 2021-03-10 ENCOUNTER — Other Ambulatory Visit: Payer: Medicaid Other

## 2021-03-18 DIAGNOSIS — K5730 Diverticulosis of large intestine without perforation or abscess without bleeding: Secondary | ICD-10-CM | POA: Diagnosis not present

## 2021-03-18 DIAGNOSIS — R413 Other amnesia: Secondary | ICD-10-CM | POA: Diagnosis not present

## 2021-03-18 DIAGNOSIS — M5441 Lumbago with sciatica, right side: Secondary | ICD-10-CM | POA: Diagnosis not present

## 2021-03-18 DIAGNOSIS — R7303 Prediabetes: Secondary | ICD-10-CM | POA: Diagnosis not present

## 2021-03-18 DIAGNOSIS — E782 Mixed hyperlipidemia: Secondary | ICD-10-CM | POA: Diagnosis not present

## 2021-03-18 DIAGNOSIS — D508 Other iron deficiency anemias: Secondary | ICD-10-CM | POA: Diagnosis not present

## 2021-03-18 DIAGNOSIS — F3341 Major depressive disorder, recurrent, in partial remission: Secondary | ICD-10-CM | POA: Diagnosis not present

## 2021-05-05 DIAGNOSIS — R7303 Prediabetes: Secondary | ICD-10-CM | POA: Diagnosis not present

## 2021-05-05 DIAGNOSIS — K5730 Diverticulosis of large intestine without perforation or abscess without bleeding: Secondary | ICD-10-CM | POA: Diagnosis not present

## 2021-05-05 DIAGNOSIS — D508 Other iron deficiency anemias: Secondary | ICD-10-CM | POA: Diagnosis not present

## 2021-05-05 DIAGNOSIS — R413 Other amnesia: Secondary | ICD-10-CM | POA: Diagnosis not present

## 2021-05-05 DIAGNOSIS — M79674 Pain in right toe(s): Secondary | ICD-10-CM | POA: Diagnosis not present

## 2021-05-05 DIAGNOSIS — E782 Mixed hyperlipidemia: Secondary | ICD-10-CM | POA: Diagnosis not present

## 2021-05-05 DIAGNOSIS — F3341 Major depressive disorder, recurrent, in partial remission: Secondary | ICD-10-CM | POA: Diagnosis not present

## 2021-05-26 DIAGNOSIS — D508 Other iron deficiency anemias: Secondary | ICD-10-CM | POA: Diagnosis not present

## 2021-05-26 DIAGNOSIS — E782 Mixed hyperlipidemia: Secondary | ICD-10-CM | POA: Diagnosis not present

## 2021-05-26 DIAGNOSIS — R7303 Prediabetes: Secondary | ICD-10-CM | POA: Diagnosis not present

## 2021-05-26 DIAGNOSIS — K5730 Diverticulosis of large intestine without perforation or abscess without bleeding: Secondary | ICD-10-CM | POA: Diagnosis not present

## 2021-05-26 DIAGNOSIS — F3341 Major depressive disorder, recurrent, in partial remission: Secondary | ICD-10-CM | POA: Diagnosis not present

## 2021-05-26 DIAGNOSIS — Z0000 Encounter for general adult medical examination without abnormal findings: Secondary | ICD-10-CM | POA: Diagnosis not present

## 2021-05-26 DIAGNOSIS — R413 Other amnesia: Secondary | ICD-10-CM | POA: Diagnosis not present

## 2021-06-21 ENCOUNTER — Other Ambulatory Visit: Payer: Medicaid Other

## 2021-07-27 DIAGNOSIS — Z0271 Encounter for disability determination: Secondary | ICD-10-CM

## 2021-08-16 DIAGNOSIS — F3341 Major depressive disorder, recurrent, in partial remission: Secondary | ICD-10-CM | POA: Diagnosis not present

## 2021-08-16 DIAGNOSIS — D508 Other iron deficiency anemias: Secondary | ICD-10-CM | POA: Diagnosis not present

## 2021-08-16 DIAGNOSIS — G43101 Migraine with aura, not intractable, with status migrainosus: Secondary | ICD-10-CM | POA: Diagnosis not present

## 2021-08-16 DIAGNOSIS — K5730 Diverticulosis of large intestine without perforation or abscess without bleeding: Secondary | ICD-10-CM | POA: Diagnosis not present

## 2021-08-16 DIAGNOSIS — R413 Other amnesia: Secondary | ICD-10-CM | POA: Diagnosis not present

## 2021-08-16 DIAGNOSIS — M25551 Pain in right hip: Secondary | ICD-10-CM | POA: Diagnosis not present

## 2021-08-16 DIAGNOSIS — E782 Mixed hyperlipidemia: Secondary | ICD-10-CM | POA: Diagnosis not present

## 2021-08-16 DIAGNOSIS — R7303 Prediabetes: Secondary | ICD-10-CM | POA: Diagnosis not present

## 2022-02-23 DIAGNOSIS — R7303 Prediabetes: Secondary | ICD-10-CM | POA: Diagnosis not present

## 2022-02-23 DIAGNOSIS — R159 Full incontinence of feces: Secondary | ICD-10-CM | POA: Diagnosis not present

## 2022-02-23 DIAGNOSIS — R32 Unspecified urinary incontinence: Secondary | ICD-10-CM | POA: Diagnosis not present

## 2022-02-23 DIAGNOSIS — G43101 Migraine with aura, not intractable, with status migrainosus: Secondary | ICD-10-CM | POA: Diagnosis not present

## 2022-02-23 DIAGNOSIS — F3341 Major depressive disorder, recurrent, in partial remission: Secondary | ICD-10-CM | POA: Diagnosis not present

## 2022-02-23 DIAGNOSIS — Z0000 Encounter for general adult medical examination without abnormal findings: Secondary | ICD-10-CM | POA: Diagnosis not present

## 2022-02-23 DIAGNOSIS — E782 Mixed hyperlipidemia: Secondary | ICD-10-CM | POA: Diagnosis not present

## 2022-02-23 DIAGNOSIS — D508 Other iron deficiency anemias: Secondary | ICD-10-CM | POA: Diagnosis not present

## 2022-02-23 DIAGNOSIS — R413 Other amnesia: Secondary | ICD-10-CM | POA: Diagnosis not present

## 2022-03-08 DIAGNOSIS — Z9049 Acquired absence of other specified parts of digestive tract: Secondary | ICD-10-CM | POA: Diagnosis not present

## 2022-03-08 DIAGNOSIS — R159 Full incontinence of feces: Secondary | ICD-10-CM | POA: Diagnosis not present

## 2022-03-08 DIAGNOSIS — R197 Diarrhea, unspecified: Secondary | ICD-10-CM | POA: Diagnosis not present

## 2022-03-08 DIAGNOSIS — R152 Fecal urgency: Secondary | ICD-10-CM | POA: Diagnosis not present

## 2022-03-08 DIAGNOSIS — D509 Iron deficiency anemia, unspecified: Secondary | ICD-10-CM | POA: Diagnosis not present

## 2022-03-14 DIAGNOSIS — F331 Major depressive disorder, recurrent, moderate: Secondary | ICD-10-CM | POA: Diagnosis not present

## 2022-04-03 DIAGNOSIS — F331 Major depressive disorder, recurrent, moderate: Secondary | ICD-10-CM | POA: Diagnosis not present

## 2022-04-13 DIAGNOSIS — F331 Major depressive disorder, recurrent, moderate: Secondary | ICD-10-CM | POA: Diagnosis not present

## 2022-04-20 DIAGNOSIS — F331 Major depressive disorder, recurrent, moderate: Secondary | ICD-10-CM | POA: Diagnosis not present

## 2022-04-28 DIAGNOSIS — Z791 Long term (current) use of non-steroidal anti-inflammatories (NSAID): Secondary | ICD-10-CM | POA: Diagnosis not present

## 2022-04-28 DIAGNOSIS — K2950 Unspecified chronic gastritis without bleeding: Secondary | ICD-10-CM | POA: Diagnosis not present

## 2022-04-28 DIAGNOSIS — Z8719 Personal history of other diseases of the digestive system: Secondary | ICD-10-CM | POA: Diagnosis not present

## 2022-04-28 DIAGNOSIS — K31A0 Gastric intestinal metaplasia, unspecified: Secondary | ICD-10-CM | POA: Diagnosis not present

## 2022-04-28 DIAGNOSIS — Z9049 Acquired absence of other specified parts of digestive tract: Secondary | ICD-10-CM | POA: Diagnosis not present

## 2022-04-28 DIAGNOSIS — K529 Noninfective gastroenteritis and colitis, unspecified: Secondary | ICD-10-CM | POA: Diagnosis not present

## 2022-04-28 DIAGNOSIS — K269 Duodenal ulcer, unspecified as acute or chronic, without hemorrhage or perforation: Secondary | ICD-10-CM | POA: Diagnosis not present

## 2022-04-28 DIAGNOSIS — K2980 Duodenitis without bleeding: Secondary | ICD-10-CM | POA: Diagnosis not present

## 2022-04-28 DIAGNOSIS — K449 Diaphragmatic hernia without obstruction or gangrene: Secondary | ICD-10-CM | POA: Diagnosis not present

## 2022-04-28 DIAGNOSIS — R197 Diarrhea, unspecified: Secondary | ICD-10-CM | POA: Diagnosis not present

## 2022-04-28 DIAGNOSIS — K5730 Diverticulosis of large intestine without perforation or abscess without bleeding: Secondary | ICD-10-CM | POA: Diagnosis not present

## 2022-04-28 DIAGNOSIS — Z980 Intestinal bypass and anastomosis status: Secondary | ICD-10-CM | POA: Diagnosis not present

## 2022-04-28 DIAGNOSIS — K3189 Other diseases of stomach and duodenum: Secondary | ICD-10-CM | POA: Diagnosis not present

## 2022-04-28 DIAGNOSIS — D509 Iron deficiency anemia, unspecified: Secondary | ICD-10-CM | POA: Diagnosis not present

## 2022-05-04 DIAGNOSIS — F331 Major depressive disorder, recurrent, moderate: Secondary | ICD-10-CM | POA: Diagnosis not present

## 2022-05-18 DIAGNOSIS — F331 Major depressive disorder, recurrent, moderate: Secondary | ICD-10-CM | POA: Diagnosis not present

## 2022-05-22 DIAGNOSIS — F4310 Post-traumatic stress disorder, unspecified: Secondary | ICD-10-CM | POA: Diagnosis not present

## 2022-05-22 DIAGNOSIS — F331 Major depressive disorder, recurrent, moderate: Secondary | ICD-10-CM | POA: Diagnosis not present

## 2022-05-24 DIAGNOSIS — Z0389 Encounter for observation for other suspected diseases and conditions ruled out: Secondary | ICD-10-CM | POA: Diagnosis not present

## 2022-05-25 DIAGNOSIS — R7303 Prediabetes: Secondary | ICD-10-CM | POA: Diagnosis not present

## 2022-05-25 DIAGNOSIS — D508 Other iron deficiency anemias: Secondary | ICD-10-CM | POA: Diagnosis not present

## 2022-05-25 DIAGNOSIS — M542 Cervicalgia: Secondary | ICD-10-CM | POA: Diagnosis not present

## 2022-05-25 DIAGNOSIS — F3341 Major depressive disorder, recurrent, in partial remission: Secondary | ICD-10-CM | POA: Diagnosis not present

## 2022-05-25 DIAGNOSIS — R413 Other amnesia: Secondary | ICD-10-CM | POA: Diagnosis not present

## 2022-05-25 DIAGNOSIS — G43101 Migraine with aura, not intractable, with status migrainosus: Secondary | ICD-10-CM | POA: Diagnosis not present

## 2022-05-25 DIAGNOSIS — F4310 Post-traumatic stress disorder, unspecified: Secondary | ICD-10-CM | POA: Diagnosis not present

## 2022-05-25 DIAGNOSIS — E782 Mixed hyperlipidemia: Secondary | ICD-10-CM | POA: Diagnosis not present

## 2022-05-26 DIAGNOSIS — Z1152 Encounter for screening for COVID-19: Secondary | ICD-10-CM | POA: Diagnosis not present

## 2022-06-19 DIAGNOSIS — F331 Major depressive disorder, recurrent, moderate: Secondary | ICD-10-CM | POA: Diagnosis not present

## 2022-06-19 DIAGNOSIS — F4310 Post-traumatic stress disorder, unspecified: Secondary | ICD-10-CM | POA: Diagnosis not present

## 2022-06-21 DIAGNOSIS — M9903 Segmental and somatic dysfunction of lumbar region: Secondary | ICD-10-CM | POA: Diagnosis not present

## 2022-06-21 DIAGNOSIS — M5382 Other specified dorsopathies, cervical region: Secondary | ICD-10-CM | POA: Diagnosis not present

## 2022-06-21 DIAGNOSIS — M5386 Other specified dorsopathies, lumbar region: Secondary | ICD-10-CM | POA: Diagnosis not present

## 2022-06-21 DIAGNOSIS — M9901 Segmental and somatic dysfunction of cervical region: Secondary | ICD-10-CM | POA: Diagnosis not present

## 2022-06-29 DIAGNOSIS — M5382 Other specified dorsopathies, cervical region: Secondary | ICD-10-CM | POA: Diagnosis not present

## 2022-06-29 DIAGNOSIS — M9903 Segmental and somatic dysfunction of lumbar region: Secondary | ICD-10-CM | POA: Diagnosis not present

## 2022-06-29 DIAGNOSIS — M5386 Other specified dorsopathies, lumbar region: Secondary | ICD-10-CM | POA: Diagnosis not present

## 2022-06-29 DIAGNOSIS — M9901 Segmental and somatic dysfunction of cervical region: Secondary | ICD-10-CM | POA: Diagnosis not present

## 2022-07-13 DIAGNOSIS — M5382 Other specified dorsopathies, cervical region: Secondary | ICD-10-CM | POA: Diagnosis not present

## 2022-07-13 DIAGNOSIS — M9903 Segmental and somatic dysfunction of lumbar region: Secondary | ICD-10-CM | POA: Diagnosis not present

## 2022-07-13 DIAGNOSIS — Z1152 Encounter for screening for COVID-19: Secondary | ICD-10-CM | POA: Diagnosis not present

## 2022-07-13 DIAGNOSIS — M9901 Segmental and somatic dysfunction of cervical region: Secondary | ICD-10-CM | POA: Diagnosis not present

## 2022-07-13 DIAGNOSIS — M5386 Other specified dorsopathies, lumbar region: Secondary | ICD-10-CM | POA: Diagnosis not present

## 2022-08-25 DIAGNOSIS — R7303 Prediabetes: Secondary | ICD-10-CM | POA: Diagnosis not present

## 2022-08-25 DIAGNOSIS — F3341 Major depressive disorder, recurrent, in partial remission: Secondary | ICD-10-CM | POA: Diagnosis not present

## 2022-08-25 DIAGNOSIS — Z124 Encounter for screening for malignant neoplasm of cervix: Secondary | ICD-10-CM | POA: Diagnosis not present

## 2022-08-25 DIAGNOSIS — D508 Other iron deficiency anemias: Secondary | ICD-10-CM | POA: Diagnosis not present

## 2022-08-25 DIAGNOSIS — R413 Other amnesia: Secondary | ICD-10-CM | POA: Diagnosis not present

## 2022-08-25 DIAGNOSIS — G43101 Migraine with aura, not intractable, with status migrainosus: Secondary | ICD-10-CM | POA: Diagnosis not present

## 2022-08-25 DIAGNOSIS — M542 Cervicalgia: Secondary | ICD-10-CM | POA: Diagnosis not present

## 2022-08-25 DIAGNOSIS — E782 Mixed hyperlipidemia: Secondary | ICD-10-CM | POA: Diagnosis not present

## 2023-03-26 ENCOUNTER — Telehealth: Payer: Self-pay | Admitting: Student

## 2023-03-26 NOTE — Telephone Encounter (Signed)
LVM to call back to schedule apt with PCP. AS, CMA 

## 2023-08-07 ENCOUNTER — Other Ambulatory Visit: Payer: Self-pay | Admitting: Physician Assistant

## 2023-08-07 DIAGNOSIS — Z1231 Encounter for screening mammogram for malignant neoplasm of breast: Secondary | ICD-10-CM

## 2024-08-19 ENCOUNTER — Other Ambulatory Visit: Payer: Self-pay | Admitting: Physician Assistant

## 2024-08-19 DIAGNOSIS — Z1231 Encounter for screening mammogram for malignant neoplasm of breast: Secondary | ICD-10-CM
